# Patient Record
Sex: Female | Born: 1960 | Race: Black or African American | Hispanic: No | Marital: Single | Smoking: Current every day smoker
Health system: Southern US, Community
[De-identification: ages and names within clinical notes are randomized; demographics above are authoritative.]

## PROBLEM LIST (undated history)

## (undated) DIAGNOSIS — I2510 Atherosclerotic heart disease of native coronary artery without angina pectoris: Secondary | ICD-10-CM

## (undated) DIAGNOSIS — I10 Essential (primary) hypertension: Secondary | ICD-10-CM

## (undated) DIAGNOSIS — M1990 Unspecified osteoarthritis, unspecified site: Secondary | ICD-10-CM

## (undated) HISTORY — PX: HAND SURGERY: SHX662

## (undated) HISTORY — PX: KNEE ARTHROSCOPY: SUR90

---

## 2003-11-18 ENCOUNTER — Emergency Department (HOSPITAL_COMMUNITY): Admission: EM | Admit: 2003-11-18 | Discharge: 2003-11-18 | Payer: Self-pay | Admitting: Emergency Medicine

## 2003-12-11 ENCOUNTER — Emergency Department (HOSPITAL_COMMUNITY): Admission: EM | Admit: 2003-12-11 | Discharge: 2003-12-11 | Payer: Self-pay | Admitting: Emergency Medicine

## 2005-10-18 ENCOUNTER — Emergency Department (HOSPITAL_COMMUNITY): Admission: AC | Admit: 2005-10-18 | Discharge: 2005-10-18 | Payer: Self-pay

## 2006-02-01 ENCOUNTER — Emergency Department (HOSPITAL_COMMUNITY): Admission: EM | Admit: 2006-02-01 | Discharge: 2006-02-01 | Payer: Self-pay | Admitting: Emergency Medicine

## 2006-10-20 ENCOUNTER — Emergency Department (HOSPITAL_COMMUNITY): Admission: EM | Admit: 2006-10-20 | Discharge: 2006-10-20 | Payer: Self-pay | Admitting: Emergency Medicine

## 2007-07-26 ENCOUNTER — Emergency Department (HOSPITAL_COMMUNITY): Admission: EM | Admit: 2007-07-26 | Discharge: 2007-07-27 | Payer: Self-pay | Admitting: Emergency Medicine

## 2007-09-12 ENCOUNTER — Emergency Department (HOSPITAL_COMMUNITY): Admission: EM | Admit: 2007-09-12 | Discharge: 2007-09-13 | Payer: Self-pay | Admitting: Emergency Medicine

## 2008-03-15 ENCOUNTER — Emergency Department (HOSPITAL_COMMUNITY): Admission: EM | Admit: 2008-03-15 | Discharge: 2008-03-16 | Payer: Self-pay | Admitting: Emergency Medicine

## 2008-10-24 ENCOUNTER — Emergency Department (HOSPITAL_COMMUNITY): Admission: EM | Admit: 2008-10-24 | Discharge: 2008-10-24 | Payer: Self-pay | Admitting: Emergency Medicine

## 2009-02-27 ENCOUNTER — Emergency Department (HOSPITAL_COMMUNITY): Admission: EM | Admit: 2009-02-27 | Discharge: 2009-02-28 | Payer: Self-pay | Admitting: Emergency Medicine

## 2009-09-24 ENCOUNTER — Ambulatory Visit: Payer: Self-pay | Admitting: Vascular Surgery

## 2009-09-24 ENCOUNTER — Encounter (INDEPENDENT_AMBULATORY_CARE_PROVIDER_SITE_OTHER): Payer: Self-pay | Admitting: Internal Medicine

## 2009-09-24 ENCOUNTER — Observation Stay (HOSPITAL_COMMUNITY): Admission: EM | Admit: 2009-09-24 | Discharge: 2009-09-25 | Payer: Self-pay | Admitting: Emergency Medicine

## 2009-09-24 ENCOUNTER — Ambulatory Visit: Payer: Self-pay | Admitting: Cardiology

## 2010-03-17 LAB — URINALYSIS, ROUTINE W REFLEX MICROSCOPIC
Glucose, UA: NEGATIVE mg/dL
Hgb urine dipstick: NEGATIVE
Ketones, ur: 15 mg/dL — AB
Nitrite: NEGATIVE
Protein, ur: NEGATIVE mg/dL
Specific Gravity, Urine: 1.02 (ref 1.005–1.030)
Urobilinogen, UA: 1 mg/dL (ref 0.0–1.0)
pH: 5 (ref 5.0–8.0)

## 2010-03-17 LAB — RAPID URINE DRUG SCREEN, HOSP PERFORMED
Amphetamines: NOT DETECTED
Barbiturates: NOT DETECTED
Benzodiazepines: NOT DETECTED
Cocaine: NOT DETECTED
Opiates: NOT DETECTED
Tetrahydrocannabinol: NOT DETECTED

## 2010-03-17 LAB — CBC
HCT: 40.2 % (ref 36.0–46.0)
Hemoglobin: 14.2 g/dL (ref 12.0–15.0)
MCH: 35.4 pg — ABNORMAL HIGH (ref 26.0–34.0)
MCHC: 35.3 g/dL (ref 30.0–36.0)
MCV: 100.2 fL — ABNORMAL HIGH (ref 78.0–100.0)
Platelets: 147 10*3/uL — ABNORMAL LOW (ref 150–400)
RBC: 4.01 MIL/uL (ref 3.87–5.11)
RDW: 12.9 % (ref 11.5–15.5)
WBC: 4.1 10*3/uL (ref 4.0–10.5)

## 2010-03-17 LAB — TROPONIN I
Troponin I: 0.01 ng/mL (ref 0.00–0.06)
Troponin I: 0.01 ng/mL (ref 0.00–0.06)

## 2010-03-17 LAB — BASIC METABOLIC PANEL
BUN: 5 mg/dL — ABNORMAL LOW (ref 6–23)
CO2: 23 mEq/L (ref 19–32)
Calcium: 10.3 mg/dL (ref 8.4–10.5)
Chloride: 112 mEq/L (ref 96–112)
Creatinine, Ser: 0.69 mg/dL (ref 0.4–1.2)
GFR calc Af Amer: >60 mL/min (ref >60)
GFR calc non Af Amer: >60 mL/min (ref >60)
Glucose, Bld: 119 mg/dL — ABNORMAL HIGH (ref 70–99)
Potassium: 3.3 mEq/L — ABNORMAL LOW (ref 3.5–5.1)
Sodium: 143 mEq/L (ref 135–145)

## 2010-03-17 LAB — DIFFERENTIAL
Basophils Absolute: 0 10*3/uL (ref 0.0–0.1)
Basophils Relative: 1 % (ref 0–1)
Eosinophils Absolute: 0.1 10*3/uL (ref 0.0–0.7)
Eosinophils Relative: 2 % (ref 0–5)
Lymphocytes Relative: 57 % — ABNORMAL HIGH (ref 12–46)
Lymphs Abs: 2.3 10*3/uL (ref 0.7–4.0)
Monocytes Absolute: 0.6 10*3/uL (ref 0.1–1.0)
Monocytes Relative: 14 % — ABNORMAL HIGH (ref 3–12)
Neutro Abs: 1.1 10*3/uL — ABNORMAL LOW (ref 1.7–7.7)
Neutrophils Relative %: 28 % — ABNORMAL LOW (ref 43–77)

## 2010-03-17 LAB — CK TOTAL AND CKMB (NOT AT ARMC)
CK, MB: 1.6 ng/mL (ref 0.3–4.0)
CK, MB: 3.3 ng/mL (ref 0.3–4.0)
CK, MB: 3.6 ng/mL (ref 0.3–4.0)
Relative Index: 2.4 (ref 0.0–2.5)
Relative Index: 2.6 — ABNORMAL HIGH (ref 0.0–2.5)
Relative Index: INVALID (ref 0.0–2.5)
Relative Index: INVALID (ref 0.0–2.5)
Total CK: 140 U/L (ref 7–177)
Total CK: 141 U/L (ref 7–177)
Total CK: 79 U/L (ref 7–177)

## 2010-03-17 LAB — PROTIME-INR
INR: 0.95 (ref 0.00–1.49)
Prothrombin Time: 12.9 seconds (ref 11.6–15.2)

## 2010-03-17 LAB — HEPATIC FUNCTION PANEL
ALT: 19 U/L (ref 0–35)
AST: 34 U/L (ref 0–37)
Albumin: 3.3 g/dL — ABNORMAL LOW (ref 3.5–5.2)
Alkaline Phosphatase: 70 U/L (ref 39–117)
Total Bilirubin: 0.7 mg/dL (ref 0.3–1.2)

## 2010-03-17 LAB — PHOSPHORUS: Phosphorus: 2.6 mg/dL (ref 2.3–4.6)

## 2010-03-17 LAB — TSH: TSH: 0.486 u[IU]/mL (ref 0.350–4.500)

## 2010-03-17 LAB — URINE MICROSCOPIC-ADD ON

## 2010-03-17 LAB — MAGNESIUM: Magnesium: 2 mg/dL (ref 1.5–2.5)

## 2010-03-17 LAB — ETHANOL: Alcohol, Ethyl (B): 164 mg/dL — ABNORMAL HIGH (ref 0–10)

## 2010-06-25 ENCOUNTER — Emergency Department (HOSPITAL_COMMUNITY)
Admission: EM | Admit: 2010-06-25 | Discharge: 2010-06-25 | Disposition: A | Discharge status: Nursing Home (Includes ICF) | Payer: Self-pay | Attending: Emergency Medicine | Admitting: Emergency Medicine

## 2010-06-25 ENCOUNTER — Emergency Department (HOSPITAL_COMMUNITY): Payer: Self-pay

## 2010-06-25 DIAGNOSIS — M25669 Stiffness of unspecified knee, not elsewhere classified: Secondary | ICD-10-CM | POA: Insufficient documentation

## 2010-06-25 DIAGNOSIS — M25469 Effusion, unspecified knee: Secondary | ICD-10-CM | POA: Insufficient documentation

## 2010-06-25 DIAGNOSIS — Z8673 Personal history of transient ischemic attack (TIA), and cerebral infarction without residual deficits: Secondary | ICD-10-CM | POA: Insufficient documentation

## 2010-06-25 DIAGNOSIS — M25569 Pain in unspecified knee: Secondary | ICD-10-CM | POA: Insufficient documentation

## 2010-09-30 LAB — COMPREHENSIVE METABOLIC PANEL
ALT: 31
Alkaline Phosphatase: 75
BUN: 3 — ABNORMAL LOW
CO2: 24
GFR calc non Af Amer: >60
Glucose, Bld: 102 — ABNORMAL HIGH
Potassium: 3.7
Total Bilirubin: 0.7
Total Protein: 8.3

## 2010-09-30 LAB — DIFFERENTIAL
Basophils Absolute: 0
Basophils Relative: 0
Eosinophils Absolute: 0
Monocytes Relative: 14 — ABNORMAL HIGH
Neutro Abs: 3.3
Neutrophils Relative %: 56

## 2010-09-30 LAB — POCT CARDIAC MARKERS
CKMB, poc: 2.3
Myoglobin, poc: 163
Operator id: 261601

## 2010-09-30 LAB — URINALYSIS, ROUTINE W REFLEX MICROSCOPIC
Bilirubin Urine: NEGATIVE
Glucose, UA: NEGATIVE
Ketones, ur: NEGATIVE
pH: 6

## 2010-09-30 LAB — CBC
HCT: 37.6
Hemoglobin: 12.4
RBC: 3.79 — ABNORMAL LOW
RDW: 19.8 — ABNORMAL HIGH

## 2010-09-30 LAB — RAPID URINE DRUG SCREEN, HOSP PERFORMED
Cocaine: NOT DETECTED
Opiates: NOT DETECTED

## 2010-09-30 LAB — URINE MICROSCOPIC-ADD ON

## 2010-09-30 LAB — ETHANOL: Alcohol, Ethyl (B): 274 — ABNORMAL HIGH

## 2010-10-05 LAB — POCT I-STAT, CHEM 8
BUN: 6
Calcium, Ion: 1.27
Chloride: 107
Creatinine, Ser: 1.2
Glucose, Bld: 98
TCO2: 24

## 2010-12-17 ENCOUNTER — Encounter: Payer: Self-pay | Admitting: Emergency Medicine

## 2010-12-17 ENCOUNTER — Emergency Department (HOSPITAL_COMMUNITY): Payer: Self-pay

## 2010-12-17 ENCOUNTER — Emergency Department (HOSPITAL_COMMUNITY)
Admission: EM | Admit: 2010-12-17 | Discharge: 2010-12-18 | Disposition: A | Discharge status: Home / Self Care | Payer: Self-pay | Attending: Emergency Medicine | Admitting: Emergency Medicine

## 2010-12-17 DIAGNOSIS — M79609 Pain in unspecified limb: Secondary | ICD-10-CM | POA: Insufficient documentation

## 2010-12-17 DIAGNOSIS — Z7982 Long term (current) use of aspirin: Secondary | ICD-10-CM | POA: Insufficient documentation

## 2010-12-17 DIAGNOSIS — Z79899 Other long term (current) drug therapy: Secondary | ICD-10-CM | POA: Insufficient documentation

## 2010-12-17 DIAGNOSIS — M79642 Pain in left hand: Secondary | ICD-10-CM

## 2010-12-17 NOTE — ED Notes (Signed)
PT. REPORTS LEFT HAND PAIN ONSET YESTERDAY MORNING , DENIES INJURY OR FALL , SLIGHT SWELLING.

## 2010-12-18 MED ORDER — IBUPROFEN 800 MG PO TABS
800.0000 mg | ORAL_TABLET | Freq: Once | ORAL | Status: AC
  Administered 2010-12-18: 800 mg via ORAL
  Filled 2010-12-18: qty 1

## 2010-12-18 MED ORDER — IBUPROFEN 800 MG PO TABS: 800.0000 mg | ORAL_TABLET | Freq: Three times a day (TID) | ORAL | Status: AC

## 2010-12-18 MED ORDER — HYDROCODONE-ACETAMINOPHEN 5-500 MG PO CAPS: 1.0000 | ORAL_CAPSULE | Freq: Four times a day (QID) | ORAL | Status: AC | PRN

## 2010-12-18 MED ORDER — HYDROCODONE-ACETAMINOPHEN 5-325 MG PO TABS
2.0000 | ORAL_TABLET | Freq: Once | ORAL | Status: AC
  Administered 2010-12-18: 2 via ORAL
  Filled 2010-12-18: qty 2

## 2010-12-18 NOTE — ED Provider Notes (Signed)
History     CSN: [id number] Arrival date & time: 12/17/2010  9:00 PM   First MD Initiated Contact with Patient 12/18/10 0015      Chief Complaint  Patient presents with  . Hand Pain    (Consider location/radiation/quality/duration/timing/severity/associated sxs/prior treatment) HPI 50 yo female presents to the ER with complaint of 2 days of left hand pain.  Pt denies any injury that she knows of to the hand.  Pt works at [employer] as a Production designer, theatre/television/film, sometimes is required to do a lot of lifting and repetitive motion with her job.  She is RHD.  No prior h/o similar pain.  No h/o gout, arthropathies.  Pain is mainly over the middle and ring MCP joints.  No otc meds prior to arrival, no ice used.  Some pain at the wrist, fingers.  Pt feels hand is swelling.   History reviewed. No pertinent past medical history.  Past Surgical History  Procedure Date  . Knee arthroscopy     No family history on file.  History  Substance Use Topics  . Smoking status: Current Everyday Smoker  . Smokeless tobacco: Not on file  . Alcohol Use: Yes    OB History    Grav Para Term Preterm Abortions TAB SAB Ect Mult Living                  Review of Systems  All other systems reviewed and are negative.    Allergies  Review of patient's allergies indicates no active allergies.  Home Medications   Current Outpatient Rx  Name Route Sig Dispense Refill  . ASPIRIN 81 MG PO TABS Oral Take 81 mg by mouth daily.      . ATORVASTATIN CALCIUM 80 MG PO TABS Oral Take 80 mg by mouth daily.     Marland Kitchen EZETIMIBE 10 MG PO TABS Oral Take 10 mg by mouth daily.      Marland Kitchen HYDROCODONE-ACETAMINOPHEN 5-500 MG PO CAPS Oral Take 1 capsule by mouth every 6 (six) hours as needed for pain. 30 capsule 0  . IBUPROFEN 800 MG PO TABS Oral Take 1 tablet (800 mg total) by mouth 3 (three) times daily. 21 tablet 0  . TRAZODONE HCL 150 MG PO TABS Oral Take 150 mg by mouth at bedtime.        BP 120/71  Pulse 80  Temp(Src) 97.6 F  (36.4 C) (Oral)  Resp 16  SpO2 98%  Physical Exam  Constitutional: She is oriented to person, place, and time. She appears well-developed and well-nourished. No distress.  HENT:  Head: Normocephalic and atraumatic.  Eyes: Conjunctivae and EOM are normal.  Neck: Normal range of motion. No JVD present. No tracheal deviation present. No thyromegaly present.  Pulmonary/Chest: No stridor.  Musculoskeletal:       Left hand examined.  No deformity, no skin discoloration, no warmth, no effusion, no notable swelling.  Pain with palpation over proximal ip/mcp of ring and middle fingers with some milder pain over the other mcps of the same hand.  Pain with flexon and extension of the hand.  Wrist with normal exam/rom.  Cap refill normal, pulses and sensation normal.  Neurological: She is alert and oriented to person, place, and time. She exhibits normal muscle tone. Coordination normal.  Skin: Skin is warm and dry. No rash noted. She is not diaphoretic. No erythema. No pallor.  Psychiatric: She has a normal mood and affect. Her behavior is normal. Judgment and thought content normal.  ED Course  Procedures (including critical care time)  Labs Reviewed - No data to display Dg Hand Complete Left  12/17/2010  *RADIOLOGY REPORT*  Clinical Data: Hand pain.  No injury  LEFT HAND - COMPLETE 3+ VIEW  Comparison: None.  Findings: Negative for fracture.  No significant arthropathy or erosion.  No degenerative changes.  IMPRESSION: Negative  Original Report Authenticated By: Camelia Phenes, M.D.     1. Hand pain, left       MDM  50 yo female with 2 day h/o left hand pain, negative xrays, and unremarkable exam.  Unsure of etiology of pain.  Will treat with splint, rest, ice, elevation, nsaids and f/u with hand ortho as needed        Olivia Mackie, MD 12/18/10 [phone]

## 2010-12-18 NOTE — ED Notes (Signed)
Pt shows no signs of neuro deficits 

## 2011-01-25 ENCOUNTER — Encounter (HOSPITAL_COMMUNITY): Payer: Self-pay | Admitting: Emergency Medicine

## 2011-01-25 ENCOUNTER — Emergency Department (INDEPENDENT_AMBULATORY_CARE_PROVIDER_SITE_OTHER)
Admission: EM | Admit: 2011-01-25 | Discharge: 2011-01-25 | Disposition: A | Discharge status: Home / Self Care | Payer: Self-pay | Source: Home / Self Care | Attending: Emergency Medicine | Admitting: Emergency Medicine

## 2011-01-25 DIAGNOSIS — J40 Bronchitis, not specified as acute or chronic: Secondary | ICD-10-CM

## 2011-01-25 DIAGNOSIS — I10 Essential (primary) hypertension: Secondary | ICD-10-CM

## 2011-01-25 HISTORY — DX: Essential (primary) hypertension: I10

## 2011-01-25 HISTORY — DX: Atherosclerotic heart disease of native coronary artery without angina pectoris: I25.10

## 2011-01-25 MED ORDER — DEXAMETHASONE 4 MG PO TABS: ORAL_TABLET | ORAL | Status: AC

## 2011-01-25 MED ORDER — ALBUTEROL SULFATE HFA 108 (90 BASE) MCG/ACT IN AERS: 1.0000 | INHALATION_SPRAY | Freq: Four times a day (QID) | RESPIRATORY_TRACT | Status: DC | PRN

## 2011-01-25 MED ORDER — GUAIFENESIN-CODEINE 100-10 MG/5ML PO SYRP: 5.0000 mL | ORAL_SOLUTION | Freq: Four times a day (QID) | ORAL | Status: AC | PRN

## 2011-01-25 MED ORDER — GUAIFENESIN ER 600 MG PO TB12: 600.0000 mg | ORAL_TABLET | Freq: Two times a day (BID) | ORAL | Status: DC

## 2011-01-25 NOTE — ED Notes (Signed)
Pt having cough since 01-02-11 that is getting progressively worse. There are greenish yellow thick secretions.  States cough worse at night and she has tried all the OTC mediations with no relief.

## 2011-01-25 NOTE — ED Provider Notes (Signed)
History     CSN: [id number]  Arrival date & time 01/25/11  1326   First MD Initiated Contact with Patient 01/25/11 1410      Chief Complaint  Patient presents with  . Cough    (Consider location/radiation/quality/duration/timing/severity/associated sxs/prior treatment) HPI Comments: Patient with cough over the past month. Cough started after having an upper respiratory infection. States is coughing up yellowish thick mucus. Reports some chest tightness, and is unable to sleep at night secondary to all the coughing. States that her chest and upper abdomen are "sore" from all the coughing. No fevers, chest pain, wheezing, shortness of breath. No nausea, vomiting, posttussive emesis, hemoptysis, or change in characteristic of her sputum,  unintentional weight loss. Tried Mucinex, Robitussin without relief. Patient is a long-term smoker.   ROS as noted in HPI. All other ROS negative.  Patient is a 51 y.o. female presenting with cough. The history is provided by the patient. No language interpreter was used.  Cough This is a new problem. The current episode started more than 1 week ago. The problem has been gradually worsening. There has been no fever. She has tried decongestants for the symptoms. She is a smoker. Her past medical history does not include bronchitis, pneumonia, COPD, emphysema or asthma.    Past Medical History  Diagnosis Date  . Hypertension   . CAD (coronary artery disease)     Past Surgical History  Procedure Date  . Knee arthroscopy     History reviewed. No pertinent family history.  History  Substance Use Topics  . Smoking status: Current Everyday Smoker -- 0.5 packs/day  . Smokeless tobacco: Not on file  . Alcohol Use: Yes    OB History    Grav Para Term Preterm Abortions TAB SAB Ect Mult Living                  Review of Systems  Respiratory: Positive for cough.     Allergies  Review of patient's allergies indicates no known  allergies.  Home Medications   Current Outpatient Rx  Name Route Sig Dispense Refill  . ALBUTEROL SULFATE HFA 108 (90 BASE) MCG/ACT IN AERS Inhalation Inhale 1-2 puffs into the lungs every 6 (six) hours as needed for wheezing. 1 Inhaler 0  . ASPIRIN 81 MG PO TABS Oral Take 81 mg by mouth daily.      Marland Kitchen DEXAMETHASONE 4 MG PO TABS  4 tabs po at once on day one, 4 tabs po at once on day 2 8 tablet 0  . GUAIFENESIN ER 600 MG PO TB12 Oral Take 1 tablet (600 mg total) by mouth 2 (two) times daily. 14 tablet 0  . GUAIFENESIN-CODEINE 100-10 MG/5ML PO SYRP Oral Take 5 mLs by mouth 4 (four) times daily as needed for cough. 120 mL 0    BP 166/104  Pulse 84  Temp(Src) 98.7 F (37.1 C) (Oral)  Resp 20  SpO2 99%  Physical Exam  Nursing note and vitals reviewed. Constitutional: She is oriented to person, place, and time. She appears well-developed and well-nourished.  HENT:  Head: Normocephalic and atraumatic.  Eyes: Conjunctivae and EOM are normal.  Neck: Normal range of motion.  Cardiovascular: Normal rate, regular rhythm, normal heart sounds and intact distal pulses.   No murmur heard. Pulmonary/Chest: Effort normal and breath sounds normal. No respiratory distress. She has no wheezes. She has no rales. She exhibits tenderness.       Coughing. Diffuse chest wall tenderness  Abdominal: Soft.  Bowel sounds are normal. She exhibits no distension. There is no tenderness. There is no rebound and no guarding.  Musculoskeletal: Normal range of motion.  Neurological: She is alert and oriented to person, place, and time.  Skin: Skin is warm and dry.  Psychiatric: She has a normal mood and affect. Her behavior is normal. Judgment and thought content normal.    ED Course  Procedures (including critical care time)  Labs Reviewed - No data to display No results found.   1. Bronchitis   2. Hypertension     MDM  Blood pressure noted. Patient asymptomatic today. Patient has not been on  any of  her chronic medications, including blood pressure medications "for some time". Does not remember what her blood pressure medicine was. Patient also smoker. No wheezing, rales, rhonchi. Satting well on room air. No history of fevers. H&P more consistent with bronchitis rather than pneumonia. Deferring chest x-ray. Sending home with bronchodilators, expectorants, cough syrup at night, and short course of steroids. Discussed importance of primary care physician and taking blood pressure medication on regular basis. Will refer to local resources.  Luiz Blare, MD 01/25/11 [phone]

## 2011-06-15 ENCOUNTER — Emergency Department (HOSPITAL_COMMUNITY)
Admission: EM | Admit: 2011-06-15 | Discharge: 2011-06-15 | Disposition: A | Discharge status: Home / Self Care | Payer: Self-pay | Attending: Emergency Medicine | Admitting: Emergency Medicine

## 2011-06-15 ENCOUNTER — Emergency Department (HOSPITAL_COMMUNITY): Payer: Self-pay

## 2011-06-15 ENCOUNTER — Encounter (HOSPITAL_COMMUNITY): Payer: Self-pay | Admitting: *Deleted

## 2011-06-15 DIAGNOSIS — I2510 Atherosclerotic heart disease of native coronary artery without angina pectoris: Secondary | ICD-10-CM | POA: Insufficient documentation

## 2011-06-15 DIAGNOSIS — F1010 Alcohol abuse, uncomplicated: Secondary | ICD-10-CM | POA: Insufficient documentation

## 2011-06-15 DIAGNOSIS — R0602 Shortness of breath: Secondary | ICD-10-CM | POA: Insufficient documentation

## 2011-06-15 DIAGNOSIS — R55 Syncope and collapse: Secondary | ICD-10-CM | POA: Insufficient documentation

## 2011-06-15 DIAGNOSIS — R079 Chest pain, unspecified: Secondary | ICD-10-CM | POA: Insufficient documentation

## 2011-06-15 DIAGNOSIS — I10 Essential (primary) hypertension: Secondary | ICD-10-CM | POA: Insufficient documentation

## 2011-06-15 LAB — URINALYSIS, ROUTINE W REFLEX MICROSCOPIC
Glucose, UA: NEGATIVE mg/dL
Protein, ur: NEGATIVE mg/dL
Specific Gravity, Urine: 1.006 (ref 1.005–1.030)
pH: 7.5 (ref 5.0–8.0)

## 2011-06-15 LAB — DIFFERENTIAL
Eosinophils Absolute: 0 10*3/uL (ref 0.0–0.7)
Lymphocytes Relative: 64 % — ABNORMAL HIGH (ref 12–46)
Lymphs Abs: 3.1 10*3/uL (ref 0.7–4.0)
Neutro Abs: 1.1 10*3/uL — ABNORMAL LOW (ref 1.7–7.7)
Neutrophils Relative %: 23 % — ABNORMAL LOW (ref 43–77)

## 2011-06-15 LAB — COMPREHENSIVE METABOLIC PANEL
BUN: 7 mg/dL (ref 6–23)
CO2: 24 mEq/L (ref 19–32)
Calcium: 9.9 mg/dL (ref 8.4–10.5)
Chloride: 106 mEq/L (ref 96–112)
Creatinine, Ser: 0.54 mg/dL (ref 0.50–1.10)
GFR calc Af Amer: >90 mL/min (ref >90)
GFR calc non Af Amer: >90 mL/min (ref >90)
Total Bilirubin: 0.3 mg/dL (ref 0.3–1.2)

## 2011-06-15 LAB — ETHANOL: Alcohol, Ethyl (B): 226 mg/dL — ABNORMAL HIGH (ref 0–11)

## 2011-06-15 LAB — TROPONIN I: Troponin I: <0.3 ng/mL (ref <0.30)

## 2011-06-15 LAB — URINE MICROSCOPIC-ADD ON

## 2011-06-15 LAB — CBC
Platelets: 171 10*3/uL (ref 150–400)
RBC: 4.13 MIL/uL (ref 3.87–5.11)
WBC: 4.8 10*3/uL (ref 4.0–10.5)

## 2011-06-15 LAB — RAPID URINE DRUG SCREEN, HOSP PERFORMED
Benzodiazepines: NOT DETECTED
Opiates: NOT DETECTED

## 2011-06-15 MED ORDER — FOLIC ACID 1 MG PO TABS: 1.0000 mg | ORAL_TABLET | Freq: Every day | ORAL | Status: DC

## 2011-06-15 MED ORDER — SODIUM CHLORIDE 0.9 % IV SOLN
1000.0000 mL | Freq: Once | INTRAVENOUS | Status: AC
  Administered 2011-06-15: 1000 mL via INTRAVENOUS

## 2011-06-15 MED ORDER — THIAMINE HCL 100 MG PO TABS: 100.0000 mg | ORAL_TABLET | Freq: Every day | ORAL | Status: DC

## 2011-06-15 MED ORDER — SODIUM CHLORIDE 0.9 % IV SOLN: 1000.0000 mL | INTRAVENOUS | Status: DC

## 2011-06-15 NOTE — ED Notes (Signed)
Pt significant other called out to nurses station saying the patient was not responsive to her. Went in room, pt sitting up, looking around. Pt sig other talking to her asking her questions, pt not responding verbally. After about 2 minutes, pt responding appropriately. See neuro assessment. Sig other reports this is the way the patient was at home when she had to call EMS.  Notified Fayrene Helper of the same. Pt transported to xray.

## 2011-06-15 NOTE — Discharge Instructions (Signed)
Please followup with [facility] cardiology for further evaluation of your chest pain. Take supplementation as described. Please avoid alcohol abuse. I include a list of resources that you can call for help if needed. Return to the ED if you have any other concerns  Alcohol Problems Most adults who drink alcohol drink in moderation (not a lot) are at low risk for developing problems related to their drinking. However, all drinkers, including low-risk drinkers, should know about the health risks connected with drinking alcohol. RECOMMENDATIONS FOR LOW-RISK DRINKING  Drink in moderation. Moderate drinking is defined as follows:   Men - no more than 2 drinks per day.   Nonpregnant women - no more than 1 drink per day.   Over age 38 - no more than 1 drink per day.  A standard drink is 12 grams of pure alcohol, which is equal to a 12 ounce bottle of beer or wine cooler, a 5 ounce glass of wine, or 1.5 ounces of distilled spirits (such as whiskey, brandy, vodka, or rum).  ABSTAIN FROM (DO NOT DRINK) ALCOHOL:  When pregnant or considering pregnancy.   When taking a medication that interacts with alcohol.   If you are alcohol dependent.   A medical condition that prohibits drinking alcohol (such as ulcer, liver disease, or heart disease).  DISCUSS WITH YOUR CAREGIVER:  If you are at risk for coronary heart disease, discuss the potential benefits and risks of alcohol use: Light to moderate drinking is associated with lower rates of coronary heart disease in certain populations (for example, men over age 23 and postmenopausal women). Infrequent or nondrinkers are advised not to begin light to moderate drinking to reduce the risk of coronary heart disease so as to avoid creating an alcohol-related problem. Similar protective effects can likely be gained through proper diet and exercise.   Women and the elderly have smaller amounts of body water than men. As a result women and the elderly achieve a higher  blood alcohol concentration after drinking the same amount of alcohol.   Exposing a fetus to alcohol can cause a broad range of birth defects referred to as Fetal Alcohol Syndrome (FAS) or Alcohol-Related Birth Defects (ARBD). Although FAS/ARBD is connected with excessive alcohol consumption during pregnancy, studies also have reported neurobehavioral problems in infants born to mothers reporting drinking an average of 1 drink per day during pregnancy.   Heavier drinking (the consumption of more than 4 drinks per occasion by men and more than 3 drinks per occasion by women) impairs learning (cognitive) and psychomotor functions and increases the risk of alcohol-related problems, including accidents and injuries.  CAGE QUESTIONS:   Have you ever felt that you should Cut down on your drinking?   Have people Annoyed you by criticizing your drinking?   Have you ever felt bad or Guilty about your drinking?   Have you ever had a drink first thing in the morning to steady your nerves or get rid of a hangover (Eye opener)?  If you answered positively to any of these questions: You may be at risk for alcohol-related problems if alcohol consumption is:   Men: Greater than 14 drinks per week or more than 4 drinks per occasion.   Women: Greater than 7 drinks per week or more than 3 drinks per occasion.  Do you or your family have a medical history of alcohol-related problems, such as:  Blackouts.   Sexual dysfunction.   Depression.   Trauma.   Liver dysfunction.   Sleep  disorders.   Hypertension.   Chronic abdominal pain.   Has your drinking ever caused you problems, such as problems with your family, problems with your work (or school) performance, or accidents/injuries?   Do you have a compulsion to drink or a preoccupation with drinking?   Do you have poor control or are you unable to stop drinking once you have started?   Do you have to drink to avoid withdrawal symptoms?   Do  you have problems with withdrawal such as tremors, nausea, sweats, or mood disturbances?   Does it take more alcohol than in the past to get you high?   Do you feel a strong urge to drink?   Do you change your plans so that you can have a drink?   Do you ever drink in the morning to relieve the shakes or a hangover?  If you have answered a number of the previous questions positively, it may be time for you to talk to your caregivers, family, and friends and see if they think you have a problem. Alcoholism is a chemical dependency that keeps getting worse and will eventually destroy your health and relationships. Many alcoholics end up dead, impoverished, or in prison. This is often the end result of all chemical dependency.  Do not be discouraged if you are not ready to take action immediately.   Decisions to change behavior often involve up and down desires to change and feeling like you cannot decide.   Try to think more seriously about your drinking behavior.   Think of the reasons to quit.  WHERE TO GO FOR ADDITIONAL INFORMATION   The National Institute on Alcohol Abuse and Alcoholism (NIAAA)www.niaaa.nih.gov   [employer] on Alcoholism and Drug Dependence (NCADD)www.ncadd.org   American Society of Addiction Medicine (ASAM)www.https://anderson-johnson.com/  Document Released: 12/19/2004 Document Revised: 12/08/2010 Document Reviewed: 08/07/2007 [facility] Patient Information 2012 [place], [place].  Syncope You have had a fainting (syncopal) spell. A fainting episode is a sudden, short-lived loss of consciousness. It results in complete recovery. It occurs because there has been a temporary shortage of oxygen and/or sugar (glucose) to the brain. CAUSES   Blood pressure pills and other medications that may lower blood pressure below normal. Sudden changes in posture (sudden standing).   Over-medication. Take your medications as directed.   Standing too long. This can cause blood to pool in the  legs.   Seizure disorders.   Low blood sugar (hypoglycemia) of diabetes. This more commonly causes coma.   Bearing down to go to the bathroom. This can cause your blood pressure to rise suddenly. Your body compensates by making the blood pressure too low when you stop bearing down.   Hardening of the arteries where the brain temporarily does not receive enough blood.   Irregular heart beat and circulatory problems.   Fear, emotional distress, injury, sight of blood, or illness.  Your caregiver will send you home if the syncope was from non-worrisome causes (benign). Depending on your age and health, you may stay to be monitored and observed. If you return home, have someone stay with you if your caregiver feels that is desirable. It is very important to keep all follow-up referrals and appointments in order to properly manage this condition. This is a serious problem which can lead to serious illness and death if not carefully managed.  WARNING: Do not drive or operate machinery until your caregiver feels that it is safe for you to do so. SEEK IMMEDIATE MEDICAL CARE IF:  You have another fainting episode or faint while lying or sitting down. DO NOT DRIVE YOURSELF. Call 911 if no other help is available.   You have chest pain, are feeling sick to your stomach (nausea), vomiting or abdominal pain.   You have an irregular heartbeat or one that is very fast (pulse over 120 beats per minute).   You have a loss of feeling in some part of your body or lose movement in your arms or legs.   You have difficulty with speech, confusion, severe weakness, or visual problems.   You become sweaty and/or feel light headed.  Make sure you are rechecked as instructed. Document Released: 12/19/2004 Document Revised: 12/08/2010 Document Reviewed: 08/09/2006 [facility] Patient Information 2012 [place], [place].  RESOURCE GUIDE  Chronic Pain Problems: Contact [facility]   [phone] Patients need to be referred by their primary care doctor.  Insufficient Money for Medicine: Contact United Way:  call "211" or [facility] 715-082-3694.  No Primary Care Doctor: - Call Health Connect  [phone] - can help you locate a primary care doctor that  accepts your insurance, provides certain services, etc. - Physician Referral Service- [phone]  Agencies that provide inexpensive medical care: - [facility]  [phone] - [facility]  [phone] - [facility]  [phone] - Women's Clinic  [phone] - Planned Parenthood  [phone] [facility]  [phone]  Medicaid-accepting [facility] Providers: - [facility]- [address]  [phone], Mon-Fri 9am-7pm, Sat 9am-1pm - [facility]- [address] [place], [address] [place]  [phone] - [facility]- [address], [address] [place]  [phone] [facility]- [address]  [phone] - Renaye Rakers- [address] [place], [address], [phone]  Only accepts [place] patients after they have their name  applied to their card  Self Pay (no insurance) in [place]: - Sickle Cell Patients: Dr Willey Blade, [facility]  [address] [place], [phone] - [facility]- [address] [place]  [phone]       [facility] [place]- [address]       -     [facility]- see information above (Speak to [employer] if you do not have insurance)       -  [facility]- [address] [place], [phone]       -  [facility]- [address],  [phone]       -  [facility]- [address] [phone]       -  Dr Julio Sicks-  [address], [place], [phone]       -  [facility]- [address], [phone]       -  [facility]- [address] [phone],  also [address], [phone]       -    [facility]- [address] [place], [phone], 1st & 3rd Saturday   every month, 10am-1pm  1) Find a Doctor and Pay Out of Pocket Although you won't have to find out who is covered by your insurance plan, it is a good idea to ask around and get recommendations. You will then need to call the office and see if  the doctor you have chosen will accept you as a new patient and what types of options they offer for patients who are self-pay. Some doctors offer discounts or will set up payment plans for their patients who do not have insurance, but you will need to ask so you aren't surprised when you get to your appointment.  2) Contact Your Local Health Department Not all health departments have doctors that can see patients for sick visits, but many do, so it is worth a call to see if yours does. If you don't know where your local health department is, you can check in your phone book. The CDC also has a tool to help you locate your state's health department, and many state websites also have listings of all of their local health departments.  3) Find a Walk-in Clinic If your illness is not likely to be very severe or complicated, you may want to try a walk in clinic. These are popping up all over the country in pharmacies, drugstores, and shopping centers. They're usually staffed by nurse practitioners or physician assistants that have been trained to treat common illnesses and complaints. They're usually fairly quick and inexpensive. However, if you have serious medical issues or chronic medical problems, these are probably not your best option  STD Testing - [facility] of [facility] [place], STD Clinic, [address], [place], phone [phone] or [phone].  Monday - Friday, call for an appointment. [facility] of [employer], STD Clinic, [address], [place], phone [phone]  or [phone].  Monday - Friday, call for an appointment.  Abuse/Neglect: [facility] Child Abuse Hotline (506)179-3182 [facility] [place] Child Abuse Hotline 4144770843 (After Hours)  Emergency Shelter:  [facility] (928)486-9395  Maternity Homes: - [facility] [phone] - [facility] [phone]  MRSA Hotline #:   989-044-6809  [facility] Resources  [facility]  United [employer] [facility] Dept. [address]                 [address]         [address] [place] [address]  Blondell Reveal Phone:  [phone]                                  Phone:  [phone]                   Phone:  [phone]  [facility], [phone] - [phone]       -     [facility] in [place], [address],                                  [phone], [place]  Child Abuse Hotline 936-610-7815 or [phone] (After Hours)   Behavioral Health Services  Substance Abuse Resources: - Alcohol and Drug Services  249-433-8846 - [facility] [phone] - [facility] [place] [phone] Floydene Flock [phone] - Residential & Outpatient Substance Abuse Program  [phone]  Psychological Services: [facility]  [phone] [facility] Services  [phone] - [facility], [phone] [place]. [address] [place], ACCESS LINE: [phone] or [phone], [employer].[email or web address]  Dental Assistance  If unable to pay or uninsured, contact:  [facility] or [facility]. to become qualified for the adult dental clinic.  Patients with Medicaid: [facility] [phone] [address],  [phone] [address] [phone]  If unable to pay, or uninsured, contact [facility] [phone]) or [facility] [phone] in [place], [phone] in [facility]) to become qualified for the adult dental clinic  Other Low-Cost Community Dental Services: - [facility]- [address] [place], [place], [place], [address], [phone], Ext. [phone], 2nd and 4th Thursday of the month at 6:30am.  10 clients each day by appointment, can sometimes see walk-in patients if someone does not show for an appointment. [facility]- [address] Ether Griffins [place], [place], [address], [phone] - [facility]- [address] [place], [place], [address], [phone] - [place] [facility]- [phone] [facility]- [phone] [facility] [phone] -

## 2011-06-15 NOTE — ED Provider Notes (Signed)
History     CSN: [id number]  Arrival date & time 06/15/11  1815   First MD Initiated Contact with Patient 06/15/11 1827      Chief Complaint  Patient presents with  . Loss of Consciousness    (Consider location/radiation/quality/duration/timing/severity/associated sxs/prior treatment) HPI  51 year old female with history of hypertension presents for evaluation of the syncope. Patient states for the past month she has had intermittent chest pain. Describe pain as a sharp sensation to the left chest that radiates to both arms. Pain is usually associated with some mild shortness of breath. Patient states stress test to worsen his symptoms. Pain is usually lasting for minutes and does radiates down to both arms with some tenderness sensation to her hands. Stress from work  worsen it and nothing seems to make it better.  Patient does not related to exertion.  Today patient was having the same chest pain as she has been expressing for the past month. She went to use the bathroom and went back to bed. Her friends who were at her house recommend pt to come to the ED for evaluation of her chest pain.  Her friend report pt was upset about going to the hospital and left the bed to walk toward her refrigerator when she apparently had a syncopal episode and fell onto the carpet.  It was a witnessed event.  Pt were on the ground for minutes and then awoke. Pt does endorse some mild confusion and having headache.  No tongue biting or incontinence.  Pt then brought to ED for further evaluation.  Pt report drinking 6 pack/day and did drink "6 packs" prior to her syncopal episode.  Sts she hasn't been eating enough lately due to lack of appetite.  She denies neck pain, vision changes, n/v/d, abd pain, urinary sxs, or numbness.  She is a 1/2 pack/day smoker.  Denies rec drug use.  Denies hx of DM.  Pt still endorse cp. And mild headache to her forehead.    Past Medical History  Diagnosis Date  . Hypertension     . CAD (coronary artery disease)     Past Surgical History  Procedure Date  . Knee arthroscopy     History reviewed. No pertinent family history.  History  Substance Use Topics  . Smoking status: Current Everyday Smoker -- 0.5 packs/day  . Smokeless tobacco: Not on file  . Alcohol Use: Yes    OB History    Grav Para Term Preterm Abortions TAB SAB Ect Mult Living                  Review of Systems  All other systems reviewed and are negative.    Allergies  Review of patient's allergies indicates no known allergies.  Home Medications   Current Outpatient Rx  Name Route Sig Dispense Refill  . ALBUTEROL SULFATE HFA 108 (90 BASE) MCG/ACT IN AERS Inhalation Inhale 1-2 puffs into the lungs every 6 (six) hours as needed for wheezing. 1 Inhaler 0  . ASPIRIN 81 MG PO TABS Oral Take 81 mg by mouth daily.      . GUAIFENESIN ER 600 MG PO TB12 Oral Take 1 tablet (600 mg total) by mouth 2 (two) times daily. 14 tablet 0    BP 165/111  Pulse 84  Temp 98.2 F (36.8 C) (Oral)  Resp 18  Ht 5\' 7"  (1.702 m)  Wt 180 lb (81.647 kg)  BMI 28.19 kg/m2  SpO2 99%  Physical Exam  Nursing  note and vitals reviewed. Constitutional: She is oriented to person, place, and time. She appears well-developed and well-nourished. No distress.       Awake, alert, nontoxic appearance  HENT:  Head: Normocephalic and atraumatic.  Eyes: Conjunctivae are normal. Right eye exhibits no discharge. Left eye exhibits no discharge.  Neck: Normal range of motion. Neck supple.  Cardiovascular: Normal rate and regular rhythm.   Pulmonary/Chest: Effort normal. No respiratory distress. She has no wheezes. She exhibits no tenderness.  Abdominal: Soft. There is no tenderness. There is no rebound.  Musculoskeletal: She exhibits no edema and no tenderness.       ROM appears intact, no obvious focal weakness  Lymphadenopathy:    She has no cervical adenopathy.  Neurological: She is alert and oriented to person,  place, and time. She has normal strength. She displays no tremor. No cranial nerve deficit or sensory deficit. She displays a negative Romberg sign. Coordination and gait normal. GCS eye subscore is 4. GCS verbal subscore is 5. GCS motor subscore is 6.       Mental status and motor strength appears intact  Skin: No rash noted.  Psychiatric: She has a normal mood and affect.    ED Course  Procedures (including critical care time)  Labs Reviewed - No data to display No results found.   No diagnosis found.   Date: 06/15/2011  Rate: 72  Rhythm: normal sinus rhythm  QRS Axis: normal  Intervals: normal  ST/T Wave abnormalities: nonspecific ST changes  Conduction Disutrbances: Atrial premature complex  Narrative Interpretation:   Old EKG Reviewed: unchanged  Results for orders placed during the hospital encounter of 06/15/11  COMPREHENSIVE METABOLIC PANEL      Component Value Range   Sodium 142  135 - 145 mEq/L   Potassium 3.7  3.5 - 5.1 mEq/L   Chloride 106  96 - 112 mEq/L   CO2 24  19 - 32 mEq/L   Glucose, Bld 106 (*) 70 - 99 mg/dL   BUN 7  6 - 23 mg/dL   Creatinine, Ser 1.61  0.50 - 1.10 mg/dL   Calcium 9.9  8.4 - 09.6 mg/dL   Total Protein 8.2  6.0 - 8.3 g/dL   Albumin 3.8  3.5 - 5.2 g/dL   AST 66 (*) 0 - 37 U/L   ALT 31  0 - 35 U/L   Alkaline Phosphatase 99  39 - 117 U/L   Total Bilirubin 0.3  0.3 - 1.2 mg/dL   GFR calc non Af Amer >90  >90 mL/min   GFR calc Af Amer >90  >90 mL/min  CBC      Component Value Range   WBC 4.8  4.0 - 10.5 K/uL   RBC 4.13  3.87 - 5.11 MIL/uL   Hemoglobin 14.1  12.0 - 15.0 g/dL   HCT 04.5  40.9 - 81.1 %   MCV 97.8  78.0 - 100.0 fL   MCH 34.1 (*) 26.0 - 34.0 pg   MCHC 34.9  30.0 - 36.0 g/dL   RDW 91.4  78.2 - 95.6 %   Platelets 171  150 - 400 K/uL  DIFFERENTIAL      Component Value Range   Neutrophils Relative 23 (*) 43 - 77 %   Neutro Abs 1.1 (*) 1.7 - 7.7 K/uL   Lymphocytes Relative 64 (*) 12 - 46 %   Lymphs Abs 3.1  0.7 - 4.0  K/uL   Monocytes Relative 11  3 - 12 %  Monocytes Absolute 0.6  0.1 - 1.0 K/uL   Eosinophils Relative 1  0 - 5 %   Eosinophils Absolute 0.0  0.0 - 0.7 K/uL   Basophils Relative 1  0 - 1 %   Basophils Absolute 0.0  0.0 - 0.1 K/uL  URINALYSIS, ROUTINE W REFLEX MICROSCOPIC      Component Value Range   Color, Urine YELLOW  YELLOW   APPearance CLEAR  CLEAR   Specific Gravity, Urine 1.006  1.005 - 1.030   pH 7.5  5.0 - 8.0   Glucose, UA NEGATIVE  NEGATIVE mg/dL   Hgb urine dipstick NEGATIVE  NEGATIVE   Bilirubin Urine NEGATIVE  NEGATIVE   Ketones, ur NEGATIVE  NEGATIVE mg/dL   Protein, ur NEGATIVE  NEGATIVE mg/dL   Urobilinogen, UA 0.2  0.0 - 1.0 mg/dL   Nitrite NEGATIVE  NEGATIVE   Leukocytes, UA SMALL (*) NEGATIVE  ETHANOL      Component Value Range   Alcohol, Ethyl (B) 226 (*) 0 - 11 mg/dL  URINE RAPID DRUG SCREEN (HOSP PERFORMED)      Component Value Range   Opiates NONE DETECTED  NONE DETECTED   Cocaine NONE DETECTED  NONE DETECTED   Benzodiazepines NONE DETECTED  NONE DETECTED   Amphetamines NONE DETECTED  NONE DETECTED   Tetrahydrocannabinol NONE DETECTED  NONE DETECTED   Barbiturates NONE DETECTED  NONE DETECTED  TROPONIN I      Component Value Range   Troponin I <0.30  <0.30 ng/mL  GLUCOSE, CAPILLARY      Component Value Range   Glucose-Capillary 109 (*) 70 - 99 mg/dL  URINE MICROSCOPIC-ADD ON      Component Value Range   Squamous Epithelial / LPF RARE  RARE   WBC, UA 0-2  <3 WBC/hpf   RBC / HPF 0-2  <3 RBC/hpf   Dg Chest 2 View  06/15/2011  *RADIOLOGY REPORT*  Clinical Data: Chest pain, shortness of breath  CHEST - 2 VIEW  Comparison: None.  Findings: Normal heart size and vascularity.  Negative for pneumonia, collapse, consolidation, edema, effusion or pneumothorax.  Trachea midline., Tortuous atherosclerotic aortic contour.  IMPRESSION: No acute chest process  Original Report Authenticated By: Judie Petit. Ruel Favors, M.D.      MDM  The patient is having recurrent  chest pain for the past month. Had a syncopal episode today. Low suspicion for seizure. EKG shows no acute changes. Chest pain and syncopal workup initiated.   No documented hx of CAD.  Pt has a 2D echo, serial enzymes and telemetry monitor in her admission in 2011 but was rule out for cardiac cause.    Vital signs and medical records were reviewed and considered.  Labs and imaging were reviewed by me.  Discussed care with my attending.      8:25 PM Patient alcohol level is 266. Her labs otherwise unremarkable. No significant changes on her EKG. Her troponin is negative. Normal orthostatic VS.    9:55 PM Syncope likely secondary to recent alcohol intoxication.  Alcohol cessation discussed.  Resources and referral given.  CP is atypical, pt agrees to f/u with cardio for further care.  Will d/c with folic acid and thiamin supplement.  My attending has seen and evaluate pt and agrees with plan.  Pt voice understanding.    Fayrene Helper, PA-C 06/15/11 2156

## 2011-06-15 NOTE — ED Notes (Addendum)
Patient got up at home and had a syncopal episode witnessed by significant other.  Significant other states" she had been working double shift 6 days a week and not eating very well" Also, states "she drinks alcohol everyday 6-7 beers". patient states "chest pain with tingling in bilateral arms on arrival to ED".

## 2011-06-15 NOTE — ED Notes (Signed)
Patient was assisted to bathroom to void.

## 2011-06-15 NOTE — ED Provider Notes (Signed)
Medical screening examination/treatment/procedure(s) were conducted as a shared visit with non-physician practitioner(s) and myself.  I personally evaluated the patient during the encounter  Patient presents with several complaints including an episode of loss of consciousness. I suspect this could be related to her significant alcohol intoxication. We counseled the patient on decreasing alcohol consumption and also on following up with primary care Dr. for further evaluation .  Patient has family here to take her home   Celene Kras, MD 06/15/11 2201

## 2011-06-15 NOTE — ED Notes (Signed)
CBG 109 

## 2011-06-15 NOTE — ED Notes (Signed)
MD/PA at bedside to discuss plan of care

## 2011-08-26 ENCOUNTER — Emergency Department (HOSPITAL_COMMUNITY): Payer: Self-pay

## 2011-08-26 ENCOUNTER — Encounter (HOSPITAL_COMMUNITY): Payer: Self-pay | Admitting: Physical Medicine and Rehabilitation

## 2011-08-26 ENCOUNTER — Emergency Department (HOSPITAL_COMMUNITY)
Admission: EM | Admit: 2011-08-26 | Discharge: 2011-08-26 | Disposition: A | Discharge status: Home / Self Care | Payer: Self-pay | Attending: Emergency Medicine | Admitting: Emergency Medicine

## 2011-08-26 DIAGNOSIS — M79642 Pain in left hand: Secondary | ICD-10-CM

## 2011-08-26 DIAGNOSIS — R209 Unspecified disturbances of skin sensation: Secondary | ICD-10-CM | POA: Insufficient documentation

## 2011-08-26 DIAGNOSIS — I10 Essential (primary) hypertension: Secondary | ICD-10-CM | POA: Insufficient documentation

## 2011-08-26 DIAGNOSIS — M79609 Pain in unspecified limb: Secondary | ICD-10-CM | POA: Insufficient documentation

## 2011-08-26 DIAGNOSIS — F17200 Nicotine dependence, unspecified, uncomplicated: Secondary | ICD-10-CM | POA: Insufficient documentation

## 2011-08-26 DIAGNOSIS — I2510 Atherosclerotic heart disease of native coronary artery without angina pectoris: Secondary | ICD-10-CM | POA: Insufficient documentation

## 2011-08-26 MED ORDER — IBUPROFEN 400 MG PO TABS
800.0000 mg | ORAL_TABLET | Freq: Once | ORAL | Status: AC
  Administered 2011-08-26: 800 mg via ORAL
  Filled 2011-08-26: qty 2

## 2011-08-26 MED ORDER — OXYCODONE-ACETAMINOPHEN 5-325 MG PO TABS
2.0000 | ORAL_TABLET | Freq: Once | ORAL | Status: AC
  Administered 2011-08-26: 2 via ORAL
  Filled 2011-08-26: qty 2

## 2011-08-26 MED ORDER — OXYCODONE-ACETAMINOPHEN 5-325 MG PO TABS: 2.0000 | ORAL_TABLET | ORAL | Status: AC | PRN

## 2011-08-26 NOTE — ED Notes (Signed)
Pt presents to department for evaluation of bilateral numbness/tingling. Ongoing x5 months. Pt states her hands "lock up." 9/10 pain at the time. Strong equal bilateral grip strengths. Pt states "sometimes my hands feel like they are asleep." she is alert and oriented x4. No signs of acute distress noted.

## 2011-08-26 NOTE — ED Provider Notes (Signed)
History  This chart was scribed for Richardean Canal, MD by Erskine Emery. This patient was seen in room TR09C/TR09C and the patient's care was started at 18:19.   CSN: [id number]  Arrival date & time 08/26/11  1636   First MD Initiated Contact with Patient 08/26/11 1819      Chief Complaint  Patient presents with  . Numbness    (Consider location/radiation/quality/duration/timing/severity/associated sxs/prior treatment) The history is provided by the patient. No language interpreter was used.  Tracy Mercado is a 51 y.o. female who presents to the Emergency Department complaining of hand pain and tingling that radiates through the arms bilaterally with associated neck pain and significant sleep disturbance for the past 4 months. Pt denies any back pain or fever. Pt reports trying OTC pain relievers, including Advil pm with only mild relief from symptoms.  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 Pt denies seeing a physician previously for the complaint. Pt reports she smokes but does no drugs. Pt was driven here by her partner.  Past Medical History  Diagnosis Date  . Hypertension   . CAD (coronary artery disease)     Past Surgical History  Procedure Date  . Knee arthroscopy     No family history on file.  History  Substance Use Topics  . Smoking status: Current Everyday Smoker -- 0.5 packs/day  . Smokeless tobacco: Not on file  . Alcohol Use: Yes    OB History    Grav Para Term Preterm Abortions TAB SAB Ect Mult Living                  Review of Systems  Constitutional: Negative for fever and chills.  HENT: Positive for neck pain.   Respiratory: Negative for shortness of breath.   Gastrointestinal: Negative for nausea and vomiting.  Musculoskeletal: Negative for back pain.  Neurological: Positive for numbness (in hands and arms bilaterally). Negative for weakness.  Psychiatric/Behavioral:  Positive for disturbed wake/sleep cycle.    Allergies  Review of patient's allergies indicates no known allergies.  Home Medications   Current Outpatient Rx  Name Route Sig Dispense Refill  . ADVIL PM PO Oral Take 1 tablet by mouth at bedtime as needed. For pain/sleep      BP 146/88  Pulse 84  Temp 98.2 F (36.8 C) (Oral)  Resp 20  SpO2 97%  Physical Exam  Nursing note and vitals reviewed. Constitutional: She is oriented to person, place, and time. She appears well-developed and well-nourished. No distress.  HENT:  Head: Normocephalic and atraumatic.  Eyes: EOM are normal.  Neck: Neck supple. No tracheal deviation present.  Cardiovascular: Normal rate.   Pulmonary/Chest: Effort normal. No respiratory distress.  Musculoskeletal: She exhibits tenderness.       Spasms in the trapezius bilaterally. Normal elbow flexion and extension. Right hand: decreased hand grasp due to pain, normal sensation throughout, wrist normal ROM. Left hand: decreased hand grasp due to pain but everything else is normal. Good distal pulses. Positive tinnel sign, more on the right than the left.   Neurological: She is alert and oriented to person, place, and time.  Skin: Skin is warm and dry.  Psychiatric: She has a normal mood and affect. Her behavior is normal.    ED Course  Procedures (including critical care time) DIAGNOSTIC STUDIES: Oxygen Saturation is 97% on room air, adequate by my interpretation.  COORDINATION OF CARE: 18:25--I evaluated the patient and we discussed a treatment plan including neck and hand x-rays and pain medication to which the pt agreed. I told the pt that her symptoms may be due to carpal tunnel. I instructed the pt to follow up with an orthopedist.   Labs Reviewed - No data to display Dg Cervical Spine Complete  08/26/2011  *RADIOLOGY REPORT*  Clinical Data: 51 year old female with neck pain.  Numbness and tingling in both hands.  No known injury.  CERVICAL SPINE -  COMPLETE 4+ VIEW  Comparison: Brain MRI 09/25/2009.  Findings: Normal prevertebral soft tissue contours. Cervicothoracic junction alignment is within normal limits.  Relatively preserved disc spaces. Bilateral posterior element alignment is within normal limits.  AP alignment and lung apices within normal limits.  C1-C2 alignment and odontoid within normal limits.  IMPRESSION: Normal for age radiographic appearance of the cervical spine.   Original Report Authenticated By: Harley Hallmark, M.D.    Dg Hand Complete Left  08/26/2011  *RADIOLOGY REPORT*  Clinical Data: 51 year old female with hand pain.  Limited range of motion.  LEFT HAND - COMPLETE 3+ VIEW  Comparison: 12/17/2010.  Findings: Bone mineralization appears stable and within normal limits throughout.  Distal radius and ulna intact.  Carpal bone alignment within normal limits.  Joint spaces preserved.  No fracture dislocation.  IMPRESSION: No acute osseous abnormality identified about the left hand.   Original Report Authenticated By: Harley Hallmark, M.D.    Dg Hand Complete Right  08/26/2011  *RADIOLOGY REPORT*  Clinical Data: 51 year old female with hand pain.  Limited range of motion.  RIGHT HAND - COMPLETE 3+ VIEW  Comparison: None.  Findings: Chronic deformity of the fifth metacarpal.  Bone mineralization appears mildly heterogeneous, but there is no periosteal reaction or osteolysis.  Elsewhere Bone mineralization is within normal limits.  Distal radius and ulna intact.  Carpal bone alignment within normal limits. Joint spaces are preserved.  No acute fracture or dislocation.  IMPRESSION: Healed fracture of the fifth metacarpal. No acute osseous abnormality identified about the right hand.   Original Report Authenticated By: Harley Hallmark, M.D.      No diagnosis found.    MDM  Tracy Mercado is a 51 y.o. female here with bilateral hand pain and tingling and neck pain. Etiology of the hand pain can be secondary to cervical  radiculopathy vs carpal tunnel syndrome vs overuse (she is a Production designer, theatre/television/film at [employer]). Xrays showed healed 5th metacarpal fracture of R hand, nl L hand xray, and nl cervical xray. Pain improved with percocet. Will recommend that she f/u with ortho and be on light duty.    This document was completed by the scribe at my direction and I have reviewed its accuracy. I have personally examined the patient and agrees with the above document.   Chaney Malling, MD     Richardean Canal, MD 08/26/11 2001

## 2011-08-26 NOTE — ED Notes (Addendum)
Bilateral numbness and tingling in bilateral arms/hands. Rt. Worse than left for months.

## 2011-11-07 ENCOUNTER — Encounter (HOSPITAL_COMMUNITY): Payer: Self-pay | Admitting: *Deleted

## 2011-11-07 DIAGNOSIS — I10 Essential (primary) hypertension: Secondary | ICD-10-CM | POA: Insufficient documentation

## 2011-11-07 DIAGNOSIS — A638 Other specified predominantly sexually transmitted diseases: Secondary | ICD-10-CM | POA: Insufficient documentation

## 2011-11-07 DIAGNOSIS — R52 Pain, unspecified: Secondary | ICD-10-CM | POA: Insufficient documentation

## 2011-11-07 DIAGNOSIS — F17200 Nicotine dependence, unspecified, uncomplicated: Secondary | ICD-10-CM | POA: Insufficient documentation

## 2011-11-07 DIAGNOSIS — A599 Trichomoniasis, unspecified: Secondary | ICD-10-CM | POA: Insufficient documentation

## 2011-11-07 DIAGNOSIS — N390 Urinary tract infection, site not specified: Secondary | ICD-10-CM | POA: Insufficient documentation

## 2011-11-07 DIAGNOSIS — I2510 Atherosclerotic heart disease of native coronary artery without angina pectoris: Secondary | ICD-10-CM | POA: Insufficient documentation

## 2011-11-07 NOTE — ED Notes (Signed)
Pt reports having a fever, nausea, vomiting, body aches, and a "bad headache x 7 days. States that she took two tylenol PM prior to coming to the ER. Pt reports last episode of emesis was at 0400 today. Pt thinks she may have the flu.

## 2011-11-08 ENCOUNTER — Emergency Department (HOSPITAL_COMMUNITY)
Admission: EM | Admit: 2011-11-08 | Discharge: 2011-11-08 | Disposition: A | Discharge status: Home / Self Care | Payer: Self-pay | Attending: Emergency Medicine | Admitting: Emergency Medicine

## 2011-11-08 DIAGNOSIS — N390 Urinary tract infection, site not specified: Secondary | ICD-10-CM

## 2011-11-08 DIAGNOSIS — A64 Unspecified sexually transmitted disease: Secondary | ICD-10-CM

## 2011-11-08 DIAGNOSIS — A599 Trichomoniasis, unspecified: Secondary | ICD-10-CM

## 2011-11-08 LAB — URINALYSIS, ROUTINE W REFLEX MICROSCOPIC
Bilirubin Urine: NEGATIVE
Glucose, UA: NEGATIVE mg/dL
Specific Gravity, Urine: 1.007 (ref 1.005–1.030)
Urobilinogen, UA: 1 mg/dL (ref 0.0–1.0)
pH: 6.5 (ref 5.0–8.0)

## 2011-11-08 LAB — CBC WITH DIFFERENTIAL/PLATELET
Basophils Relative: 1 % (ref 0–1)
Eosinophils Relative: 0 % (ref 0–5)
HCT: 35.3 % — ABNORMAL LOW (ref 36.0–46.0)
Hemoglobin: 12.2 g/dL (ref 12.0–15.0)
Lymphs Abs: 2.3 10*3/uL (ref 0.7–4.0)
MCH: 33.1 pg (ref 26.0–34.0)
MCV: 95.7 fL (ref 78.0–100.0)
Monocytes Absolute: 2 10*3/uL — ABNORMAL HIGH (ref 0.1–1.0)
Monocytes Relative: 24 % — ABNORMAL HIGH (ref 3–12)
Platelets: 298 10*3/uL (ref 150–400)
RBC: 3.69 MIL/uL — ABNORMAL LOW (ref 3.87–5.11)
WBC: 8.5 10*3/uL (ref 4.0–10.5)

## 2011-11-08 LAB — URINE MICROSCOPIC-ADD ON

## 2011-11-08 LAB — POCT I-STAT, CHEM 8
BUN: 6 mg/dL (ref 6–23)
Calcium, Ion: 1.3 mmol/L — ABNORMAL HIGH (ref 1.12–1.23)
Chloride: 101 mEq/L (ref 96–112)
Creatinine, Ser: 0.8 mg/dL (ref 0.50–1.10)
TCO2: 24 mmol/L (ref 0–100)

## 2011-11-08 MED ORDER — METRONIDAZOLE 500 MG PO TABS
2000.0000 mg | ORAL_TABLET | Freq: Once | ORAL | Status: AC
  Administered 2011-11-08: 2000 mg via ORAL
  Filled 2011-11-08: qty 4

## 2011-11-08 MED ORDER — SODIUM CHLORIDE 0.9 % IV BOLUS (SEPSIS)
1000.0000 mL | Freq: Once | INTRAVENOUS | Status: AC
  Administered 2011-11-08: 1000 mL via INTRAVENOUS

## 2011-11-08 MED ORDER — POTASSIUM CHLORIDE 10 MEQ/100ML IV SOLN
10.0000 meq | Freq: Once | INTRAVENOUS | Status: AC
  Administered 2011-11-08: 10 meq via INTRAVENOUS
  Filled 2011-11-08: qty 100

## 2011-11-08 MED ORDER — POTASSIUM CHLORIDE CRYS ER 20 MEQ PO TBCR
40.0000 meq | EXTENDED_RELEASE_TABLET | Freq: Once | ORAL | Status: AC
  Administered 2011-11-08: 40 meq via ORAL
  Filled 2011-11-08: qty 2

## 2011-11-08 MED ORDER — KETOROLAC TROMETHAMINE 30 MG/ML IJ SOLN
30.0000 mg | Freq: Once | INTRAMUSCULAR | Status: AC
  Administered 2011-11-08: 30 mg via INTRAVENOUS
  Filled 2011-11-08: qty 1

## 2011-11-08 MED ORDER — CEFIXIME 400 MG PO TABS: 400.0000 mg | ORAL_TABLET | Freq: Every day | ORAL | Status: DC

## 2011-11-08 MED ORDER — AZITHROMYCIN 250 MG PO TABS
1000.0000 mg | ORAL_TABLET | Freq: Once | ORAL | Status: AC
  Administered 2011-11-08: 1000 mg via ORAL
  Filled 2011-11-08 (×2): qty 2

## 2011-11-08 MED ORDER — ONDANSETRON HCL 4 MG/2ML IJ SOLN
4.0000 mg | Freq: Once | INTRAMUSCULAR | Status: AC
  Administered 2011-11-08: 4 mg via INTRAVENOUS
  Filled 2011-11-08: qty 2

## 2011-11-08 MED ORDER — DEXTROSE 5 % IV SOLN
1.0000 g | Freq: Once | INTRAVENOUS | Status: AC
  Administered 2011-11-08: 1 g via INTRAVENOUS
  Filled 2011-11-08: qty 10

## 2011-11-08 NOTE — ED Provider Notes (Signed)
Medical screening examination/treatment/procedure(s) were performed by non-physician practitioner and as supervising physician I was immediately available for consultation/collaboration.  Shelda Jakes, MD 11/08/11 [phone]

## 2011-11-08 NOTE — ED Notes (Signed)
Attempted to collect urine specimen. Pt stated she will press the call bell when she is able to void

## 2011-11-08 NOTE — ED Notes (Signed)
Advised patient a urine sample is needed.

## 2011-11-08 NOTE — ED Provider Notes (Signed)
History     CSN: [id number]  Arrival date & time 11/07/11  2341   First MD Initiated Contact with Patient 11/08/11 0225      Chief Complaint  Patient presents with  . Emesis  . Generalized Body Aches  . Headache   HPI  History provided by the patient. Patient is a 51 year old female with history of hypertension who presents with complaints of fever, headache, general body aches and occasional nausea vomiting symptoms. Patient reports symptoms began gradually and have been waxing and waning for the past week. Patient has been using Tylenol PM as for fever symptoms with some improvement. Patient continues to feel poorly. She denies any recent travel or known sick contacts. Patient states that she feels like she may have the flu. She denies any cough symptoms. Denies any nasal congestion or rhinorrhea. Denies any diarrhea or abdominal pain. Patient denies any urinary complaints.      Past Medical History  Diagnosis Date  . Hypertension   . CAD (coronary artery disease)     Past Surgical History  Procedure Date  . Knee arthroscopy     No family history on file.  History  Substance Use Topics  . Smoking status: Current Every Day Smoker -- 0.5 packs/day  . Smokeless tobacco: Not on file  . Alcohol Use: Yes    OB History    Grav Para Term Preterm Abortions TAB SAB Ect Mult Living                  Review of Systems  Constitutional: Positive for fever, chills, diaphoresis, appetite change and fatigue.  HENT: Negative for congestion, sore throat and rhinorrhea.   Respiratory: Negative for cough and shortness of breath.   Cardiovascular: Negative for chest pain.  Gastrointestinal: Positive for nausea and vomiting. Negative for abdominal pain, diarrhea and constipation.  Genitourinary: Negative for dysuria, frequency, hematuria and flank pain.  Skin: Negative for rash.  Neurological: Positive for light-headedness and headaches. Negative for facial asymmetry, weakness and  numbness.  Psychiatric/Behavioral: Negative for confusion.    Allergies  Review of patient's allergies indicates no known allergies.  Home Medications   Current Outpatient Rx  Name  Route  Sig  Dispense  Refill  . TYLENOL PO   Oral   Take 2 tablets by mouth every 6 (six) hours as needed. For pain           BP 133/74  Pulse 69  Temp 98.9 F (37.2 C) (Oral)  Resp 18  Ht 5\' 7"  (1.702 m)  Wt 175 lb (79.379 kg)  BMI 27.41 kg/m2  SpO2 98%  Physical Exam  Nursing note and vitals reviewed. Constitutional: She is oriented to person, place, and time. She appears well-developed and well-nourished. No distress.  HENT:  Head: Normocephalic and atraumatic.  Mouth/Throat: Oropharynx is clear and moist.  Eyes: Conjunctivae normal and EOM are normal. Pupils are equal, round, and reactive to light.  Neck: Normal range of motion. Neck supple.       No meningeal signs  Cardiovascular: Normal rate and regular rhythm.   No murmur heard. Pulmonary/Chest: Effort normal and breath sounds normal. No respiratory distress. She has no wheezes. She has no rales.  Abdominal: Soft. There is no tenderness. There is no rigidity, no rebound, no guarding, no CVA tenderness and no tenderness at McBurney's point.  Lymphadenopathy:    She has no cervical adenopathy.  Neurological: She is alert and oriented to person, place, and time. She has normal  strength. No cranial nerve deficit or sensory deficit. Gait normal.  Skin: Skin is warm and dry. No rash noted.  Psychiatric: She has a normal mood and affect. Her behavior is normal.    ED Course  Procedures   Results for orders placed during the hospital encounter of 11/08/11  URINALYSIS, ROUTINE W REFLEX MICROSCOPIC      Component Value Range   Color, Urine YELLOW  YELLOW   APPearance CLOUDY (*) CLEAR   Specific Gravity, Urine 1.007  1.005 - 1.030   pH 6.5  5.0 - 8.0   Glucose, UA NEGATIVE  NEGATIVE mg/dL   Hgb urine dipstick TRACE (*) NEGATIVE    Bilirubin Urine NEGATIVE  NEGATIVE   Ketones, ur NEGATIVE  NEGATIVE mg/dL   Protein, ur NEGATIVE  NEGATIVE mg/dL   Urobilinogen, UA 1.0  0.0 - 1.0 mg/dL   Nitrite POSITIVE (*) NEGATIVE   Leukocytes, UA LARGE (*) NEGATIVE  CBC WITH DIFFERENTIAL      Component Value Range   WBC 8.5  4.0 - 10.5 K/uL   RBC 3.69 (*) 3.87 - 5.11 MIL/uL   Hemoglobin 12.2  12.0 - 15.0 g/dL   HCT 16.1 (*) 09.6 - 04.5 %   MCV 95.7  78.0 - 100.0 fL   MCH 33.1  26.0 - 34.0 pg   MCHC 34.6  30.0 - 36.0 g/dL   RDW 40.9  81.1 - 91.4 %   Platelets 298  150 - 400 K/uL   Neutrophils Relative 48  43 - 77 %   Lymphocytes Relative 27  12 - 46 %   Monocytes Relative 24 (*) 3 - 12 %   Eosinophils Relative 0  0 - 5 %   Basophils Relative 1  0 - 1 %   Neutro Abs 4.1  1.7 - 7.7 K/uL   Lymphs Abs 2.3  0.7 - 4.0 K/uL   Monocytes Absolute 2.0 (*) 0.1 - 1.0 K/uL   Eosinophils Absolute 0.0  0.0 - 0.7 K/uL   Basophils Absolute 0.1  0.0 - 0.1 K/uL   WBC Morphology ATYPICAL LYMPHOCYTES    POCT I-STAT, CHEM 8      Component Value Range   Sodium 138  135 - 145 mEq/L   Potassium 3.2 (*) 3.5 - 5.1 mEq/L   Chloride 101  96 - 112 mEq/L   BUN 6  6 - 23 mg/dL   Creatinine, Ser 7.82  0.50 - 1.10 mg/dL   Glucose, Bld 956 (*) 70 - 99 mg/dL   Calcium, Ion 2.13 (*) 1.12 - 1.23 mmol/L   TCO2 24  0 - 100 mmol/L   Hemoglobin 12.6  12.0 - 15.0 g/dL   HCT 08.6  57.8 - 46.9 %  URINE MICROSCOPIC-ADD ON      Component Value Range   Squamous Epithelial / LPF FEW (*) RARE   WBC, UA TOO NUMEROUS TO COUNT  <3 WBC/hpf   RBC / HPF 0-2  <3 RBC/hpf   Bacteria, UA MANY (*) RARE   Urine-Other FEW TRICHOMONAS         1. UTI (lower urinary tract infection)   2. Trichomonas   3. Sexually transmitted disease (STD)       MDM  2:30AM patient seen and evaluated. Patient no acute distress and well-appearing. Patient does not appear toxic.   Patient feeling better after IV fluids and medications. Labs showed slight hypokalemia otherwise  unremarkable. Urine showed signs for ear check infection as well as a few Trichomonas present.  I discussed findings with patient. She understands she will need further evaluation for possible STDs and to inform all of her partners. She received treatments today.     Angus Seller, [place] 11/08/11 [phone]

## 2011-11-09 LAB — GC/CHLAMYDIA PROBE AMP, URINE: GC Probe Amp, Urine: NEGATIVE

## 2011-11-10 LAB — URINE CULTURE

## 2011-11-11 NOTE — ED Notes (Signed)
+  Urine. Patient treated with Suprax. Sensitive to same. Per protocol MD. °

## 2012-06-02 ENCOUNTER — Emergency Department (HOSPITAL_COMMUNITY): Payer: No Typology Code available for payment source

## 2012-06-02 ENCOUNTER — Encounter (HOSPITAL_COMMUNITY): Payer: Self-pay | Admitting: *Deleted

## 2012-06-02 ENCOUNTER — Emergency Department (HOSPITAL_COMMUNITY)
Admission: EM | Admit: 2012-06-02 | Discharge: 2012-06-02 | Disposition: A | Discharge status: Home / Self Care | Payer: No Typology Code available for payment source | Attending: Emergency Medicine | Admitting: Emergency Medicine

## 2012-06-02 DIAGNOSIS — I2510 Atherosclerotic heart disease of native coronary artery without angina pectoris: Secondary | ICD-10-CM | POA: Insufficient documentation

## 2012-06-02 DIAGNOSIS — R51 Headache: Secondary | ICD-10-CM | POA: Insufficient documentation

## 2012-06-02 DIAGNOSIS — F411 Generalized anxiety disorder: Secondary | ICD-10-CM | POA: Insufficient documentation

## 2012-06-02 DIAGNOSIS — Z8673 Personal history of transient ischemic attack (TIA), and cerebral infarction without residual deficits: Secondary | ICD-10-CM | POA: Insufficient documentation

## 2012-06-02 DIAGNOSIS — F419 Anxiety disorder, unspecified: Secondary | ICD-10-CM

## 2012-06-02 DIAGNOSIS — I10 Essential (primary) hypertension: Secondary | ICD-10-CM | POA: Insufficient documentation

## 2012-06-02 DIAGNOSIS — R110 Nausea: Secondary | ICD-10-CM | POA: Insufficient documentation

## 2012-06-02 DIAGNOSIS — F17200 Nicotine dependence, unspecified, uncomplicated: Secondary | ICD-10-CM | POA: Insufficient documentation

## 2012-06-02 LAB — URINALYSIS, ROUTINE W REFLEX MICROSCOPIC
Ketones, ur: NEGATIVE mg/dL
Nitrite: NEGATIVE
Protein, ur: 30 mg/dL — AB

## 2012-06-02 LAB — CBC WITH DIFFERENTIAL/PLATELET
Basophils Absolute: 0 10*3/uL (ref 0.0–0.1)
Basophils Relative: 1 % (ref 0–1)
Eosinophils Absolute: 0 10*3/uL (ref 0.0–0.7)
Eosinophils Relative: 1 % (ref 0–5)
HCT: 42.9 % (ref 36.0–46.0)
Hemoglobin: 15.3 g/dL — ABNORMAL HIGH (ref 12.0–15.0)
MCH: 34.5 pg — ABNORMAL HIGH (ref 26.0–34.0)
MCHC: 35.7 g/dL (ref 30.0–36.0)
Monocytes Absolute: 1.1 10*3/uL — ABNORMAL HIGH (ref 0.1–1.0)
Platelets: 165 10*3/uL (ref 150–400)
RBC: 4.44 MIL/uL (ref 3.87–5.11)
RDW: 12.4 % (ref 11.5–15.5)

## 2012-06-02 LAB — BASIC METABOLIC PANEL
Chloride: 97 mEq/L (ref 96–112)
GFR calc Af Amer: >90 mL/min (ref >90)
GFR calc non Af Amer: >90 mL/min (ref >90)
Potassium: 3.6 mEq/L (ref 3.5–5.1)
Sodium: 133 mEq/L — ABNORMAL LOW (ref 135–145)

## 2012-06-02 LAB — URINE MICROSCOPIC-ADD ON

## 2012-06-02 MED ORDER — HYDROCHLOROTHIAZIDE 25 MG PO TABS: 25.0000 mg | ORAL_TABLET | Freq: Every day | ORAL | Status: DC

## 2012-06-02 MED ORDER — ALPRAZOLAM 0.25 MG PO TABS: 0.2500 mg | ORAL_TABLET | Freq: Three times a day (TID) | ORAL | Status: DC | PRN

## 2012-06-02 MED ORDER — LORAZEPAM 2 MG/ML IJ SOLN
0.5000 mg | Freq: Once | INTRAMUSCULAR | Status: AC
  Administered 2012-06-02: 0.5 mg via INTRAVENOUS
  Filled 2012-06-02: qty 1

## 2012-06-02 MED ORDER — KETOROLAC TROMETHAMINE 30 MG/ML IJ SOLN
30.0000 mg | Freq: Once | INTRAMUSCULAR | Status: AC
  Administered 2012-06-02: 30 mg via INTRAVENOUS
  Filled 2012-06-02: qty 1

## 2012-06-02 MED ORDER — HYDROCHLOROTHIAZIDE 12.5 MG PO CAPS
12.5000 mg | ORAL_CAPSULE | Freq: Once | ORAL | Status: AC
  Administered 2012-06-02: 12.5 mg via ORAL
  Filled 2012-06-02: qty 1

## 2012-06-02 MED ORDER — LISINOPRIL 20 MG PO TABS: 20.0000 mg | ORAL_TABLET | Freq: Every day | ORAL | Status: DC

## 2012-06-02 MED ORDER — METOCLOPRAMIDE HCL 5 MG/ML IJ SOLN
10.0000 mg | Freq: Once | INTRAMUSCULAR | Status: AC
  Administered 2012-06-02: 10 mg via INTRAVENOUS
  Filled 2012-06-02: qty 2

## 2012-06-02 MED ORDER — DIPHENHYDRAMINE HCL 50 MG/ML IJ SOLN
25.0000 mg | Freq: Once | INTRAMUSCULAR | Status: AC
  Administered 2012-06-02: 25 mg via INTRAVENOUS
  Filled 2012-06-02: qty 1

## 2012-06-02 MED ORDER — LISINOPRIL 10 MG PO TABS
10.0000 mg | ORAL_TABLET | Freq: Once | ORAL | Status: AC
  Administered 2012-06-02: 10 mg via ORAL
  Filled 2012-06-02: qty 1

## 2012-06-02 NOTE — ED Notes (Signed)
Pt states has had a severe headache since this morning, states has had n/v, sensitivity to light and sound, hx of HTN but does not take medication.

## 2012-06-02 NOTE — ED Notes (Signed)
Pt states she has had a headache since last night, bp elevated, pts visitor states she has not been herself, lack of appetite and nausea

## 2012-06-02 NOTE — ED Provider Notes (Signed)
History     CSN: [id number]  Arrival date & time 06/02/12  0865   First MD Initiated Contact with Patient 06/02/12 1934      Chief Complaint  Patient presents with  . Hypertension  . Headache    (Consider location/radiation/quality/duration/timing/severity/associated sxs/prior treatment) The history is provided by the patient.  DELAYNA SPARLIN is a 52 y.o. female history of hypertension, CAD, stroke here presenting with headache. She has history of headaches and worsening headaches since yesterday. Gradual onset. She has been more stressed out at work recently. As per her female partner, she has more nausea and lack of appetite today. Her blood pressure also has been elevated and she has been off her BP meds for a year. Denies weakness or numbness or chest pain or SOB or abdominal pain or urinary symptoms.    Past Medical History  Diagnosis Date  . Hypertension   . CAD (coronary artery disease)     Past Surgical History  Procedure Laterality Date  . Knee arthroscopy      No family history on file.  History  Substance Use Topics  . Smoking status: Current Every Day Smoker -- 0.50 packs/day  . Smokeless tobacco: Not on file  . Alcohol Use: Yes    OB History   Grav Para Term Preterm Abortions TAB SAB Ect Mult Living                  Review of Systems  Gastrointestinal: Positive for nausea.  Neurological: Positive for headaches.  All other systems reviewed and are negative.    Allergies  Review of patient's allergies indicates no known allergies.  Home Medications   Current Outpatient Rx  Name  Route  Sig  Dispense  Refill  . Acetaminophen (TYLENOL PO)   Oral   Take 2 tablets by mouth every 6 (six) hours as needed. For pain         . ibuprofen (ADVIL,MOTRIN) 200 MG tablet   Oral   Take 200 mg by mouth every 6 (six) hours as needed for pain.           BP 157/106  Pulse 65  Temp(Src) 98.6 F (37 C) (Oral)  Resp 14  Ht 5\' 6"  (1.676 m)  Wt 175  lb (79.379 kg)  BMI 28.26 kg/m2  SpO2 100%  Physical Exam  Nursing note and vitals reviewed. Constitutional: She is oriented to person, place, and time. She appears well-developed and well-nourished.  Slightly uncomfortable   HENT:  Head: Normocephalic.  Mouth/Throat: Oropharynx is clear and moist.  Eyes: Conjunctivae are normal. Pupils are equal, round, and reactive to light.  Neck: Normal range of motion. Neck supple.  Cardiovascular: Normal rate, regular rhythm and normal heart sounds.   Pulmonary/Chest: Effort normal and breath sounds normal. No respiratory distress. She has no wheezes. She has no rales.  Abdominal: Soft. Bowel sounds are normal. She exhibits no distension. There is no tenderness. There is no rebound and no guarding.  Musculoskeletal: Normal range of motion.  Neurological: She is alert and oriented to person, place, and time. She displays normal reflexes. No cranial nerve deficit. Coordination normal.  Nl finger to nose, nl strength and sensation throughout   Skin: Skin is warm and dry.  Psychiatric: She has a normal mood and affect. Her behavior is normal. Judgment and thought content normal.    ED Course  Procedures (including critical care time)  Labs Reviewed  BASIC METABOLIC PANEL - Abnormal; Notable for the  following:    Sodium 133 (*)    Glucose, Bld 115 (*)    All other components within normal limits  CBC WITH DIFFERENTIAL - Abnormal; Notable for the following:    Hemoglobin 15.3 (*)    MCH 34.5 (*)    Neutrophils Relative % 33 (*)    Neutro Abs 1.4 (*)    Monocytes Relative 25 (*)    Monocytes Absolute 1.1 (*)    All other components within normal limits  URINALYSIS, ROUTINE W REFLEX MICROSCOPIC - Abnormal; Notable for the following:    Color, Urine AMBER (*)    Hgb urine dipstick SMALL (*)    Bilirubin Urine SMALL (*)    Protein, ur 30 (*)    Leukocytes, UA TRACE (*)    All other components within normal limits  URINE MICROSCOPIC-ADD ON -  Abnormal; Notable for the following:    Squamous Epithelial / LPF FEW (*)    All other components within normal limits   Ct Head Wo Contrast  06/02/2012   *RADIOLOGY REPORT*  Clinical Data: Headache with elevated blood pressure, nausea and lack of appetite.  CT HEAD WITHOUT CONTRAST  Technique:  Contiguous axial images were obtained from the base of the skull through the vertex without contrast.  Comparison: MRI brain 09/25/2009.  Head CT 09/24/2009.  Findings: There is no evidence of acute intracranial hemorrhage, mass lesion, brain edema or extra-axial fluid collection.  The ventricles and subarachnoid spaces are appropriately sized for age. There is no CT evidence of acute cortical infarction.  The visualized paranasal sinuses are clear. The calvarium is intact.  IMPRESSION: Unremarkable noncontrast head CT.  No acute intracranial findings.   Original Report Authenticated By: Carey Bullocks, M.D.     No diagnosis found.    MDM  KRIS BURD is a 52 y.o. female here with HTN, headache. Will need to r/o head bleed. I think its likely migraines from stress. Will do CT head and treat symptomatically. Not concerned for subarachnoid.  9:51 PM Labs unremarkable. BP dec to 150/100 after HCTZ and lisinopril. UA contaminated and she has no symptoms. Will d/c home on BP meds. I recommend that she finds a PMD for f/u.         Richardean Canal, MD 06/02/12 2152

## 2012-07-16 ENCOUNTER — Encounter (HOSPITAL_COMMUNITY): Payer: Self-pay | Admitting: Emergency Medicine

## 2012-07-16 ENCOUNTER — Emergency Department (INDEPENDENT_AMBULATORY_CARE_PROVIDER_SITE_OTHER)
Admission: EM | Admit: 2012-07-16 | Discharge: 2012-07-16 | Disposition: A | Discharge status: Home / Self Care | Payer: No Typology Code available for payment source | Source: Home / Self Care

## 2012-07-16 ENCOUNTER — Emergency Department (INDEPENDENT_AMBULATORY_CARE_PROVIDER_SITE_OTHER): Payer: No Typology Code available for payment source

## 2012-07-16 DIAGNOSIS — J309 Allergic rhinitis, unspecified: Secondary | ICD-10-CM

## 2012-07-16 MED ORDER — HYDROCOD POLST-CHLORPHEN POLST 10-8 MG/5ML PO LQCR: 5.0000 mL | Freq: Two times a day (BID) | ORAL | Status: DC | PRN

## 2012-07-16 MED ORDER — FLUTICASONE PROPIONATE 50 MCG/ACT NA SUSP: 2.0000 | Freq: Every day | NASAL | Status: DC

## 2012-07-16 MED ORDER — AZITHROMYCIN 250 MG PO TABS: ORAL_TABLET | ORAL | Status: DC

## 2012-07-16 MED ORDER — FEXOFENADINE-PSEUDOEPHED ER 60-120 MG PO TB12: 1.0000 | ORAL_TABLET | Freq: Two times a day (BID) | ORAL | Status: DC

## 2012-07-16 NOTE — ED Provider Notes (Signed)
Medical screening examination/treatment/procedure(s) were performed by non-physician practitioner and as supervising physician I was immediately available for consultation/collaboration.  Joseandres Mazer, M.D.  Primus Gritton C Blaire Palomino, MD 07/16/12 2058 

## 2012-07-16 NOTE — ED Provider Notes (Signed)
History    CSN: [id number] Arrival date & time 07/16/12  1816  First MD Initiated Contact with Patient 07/16/12 2015     Chief Complaint  Patient presents with  . Cough   (Consider location/radiation/quality/duration/timing/severity/associated sxs/prior Treatment) HPI  52 yo bf presents today with the above complaint.  States that nasal/chest congestion, cough, postnasal drainage, and sore throat ongoing x 2 weeks.  Productive cough and nasal drainage clear.  Denies fever, chills, cp, sob.  Hx of smoking and question seasonal allergies.   Past Medical History  Diagnosis Date  . Hypertension   . CAD (coronary artery disease)    Past Surgical History  Procedure Laterality Date  . Knee arthroscopy     No family history on file. History  Substance Use Topics  . Smoking status: Current Every Day Smoker -- 0.50 packs/day  . Smokeless tobacco: Not on file  . Alcohol Use: Yes   OB History   Grav Para Term Preterm Abortions TAB SAB Ect Mult Living                 Review of Systems  Constitutional: Negative.   HENT: Positive for congestion, rhinorrhea and postnasal drip. Negative for hearing loss, ear pain, nosebleeds, neck stiffness and ear discharge.   Eyes: Negative.   Respiratory: Positive for cough. Negative for choking, chest tightness, shortness of breath and wheezing.   Cardiovascular: Negative.   Gastrointestinal: Negative.   Endocrine: Negative.   Genitourinary: Negative.   Musculoskeletal: Negative.   Skin: Negative.   Hematological: Negative.   Psychiatric/Behavioral: Negative.     Allergies  Review of patient's allergies indicates no known allergies.  Home Medications   Current Outpatient Rx  Name  Route  Sig  Dispense  Refill  . hydrochlorothiazide (HYDRODIURIL) 25 MG tablet   Oral   Take 1 tablet (25 mg total) by mouth daily.   30 tablet   0   . lisinopril (PRINIVIL,ZESTRIL) 20 MG tablet   Oral   Take 1 tablet (20 mg total) by mouth daily.   30  tablet   0   . Acetaminophen (TYLENOL PO)   Oral   Take 2 tablets by mouth every 6 (six) hours as needed. For pain         . ALPRAZolam (XANAX) 0.25 MG tablet   Oral   Take 1 tablet (0.25 mg total) by mouth 3 (three) times daily as needed for anxiety.   15 tablet   0   . azithromycin (ZITHROMAX) 250 MG tablet      5 day dose pack.  Take as directed.   6 tablet   0   . chlorpheniramine-HYDROcodone (TUSSIONEX PENNKINETIC ER) 10-8 MG/5ML LQCR   Oral   Take 5 mLs by mouth every 12 (twelve) hours as needed (cough.  do not drive or consume alcoholic beverages when using this medication.).   140 mL   0   . fexofenadine-pseudoephedrine (ALLEGRA-D) 60-120 MG per tablet   Oral   Take 1 tablet by mouth every 12 (twelve) hours.   30 tablet   0   . fluticasone (FLONASE) 50 MCG/ACT nasal spray   Nasal   Place 2 sprays into the nose daily.   16 g   1   . ibuprofen (ADVIL,MOTRIN) 200 MG tablet   Oral   Take 200 mg by mouth every 6 (six) hours as needed for pain.          BP 149/86  Pulse 79  Temp(Src)  98.4 F (36.9 C) (Oral)  Resp 20  SpO2 96% Physical Exam  Constitutional: She is oriented to person, place, and time. She appears well-developed and well-nourished.  HENT:  Head: Normocephalic and atraumatic.  Mouth/Throat: Oropharynx is clear and moist.  Frontal and maxillary sinus tenderness.   Eyes: EOM are normal. Pupils are equal, round, and reactive to light.  Neck: Normal range of motion.  Cardiovascular: Normal rate and regular rhythm.   Pulmonary/Chest: Effort normal and breath sounds normal.  Musculoskeletal: Normal range of motion.  Neurological: She is alert and oriented to person, place, and time.  Skin: Skin is warm.    ED Course  Procedures (including critical care time) Labs Reviewed  POCT RAPID STREP A ([facility] CARE ONLY)   Dg Chest 2 View  07/16/2012   *RADIOLOGY REPORT*  Clinical Data: Productive cough.  Coronary artery disease. Hypertension.   CHEST - 2 VIEW  Comparison: 06/15/2011  Findings: Cardiac and mediastinal contours appear normal.  The lungs appear clear.  No pleural effusion is identified.  Mild lower thoracic spondylosis noted.  IMPRESSION:  1.  No acute findings.   Original Report Authenticated By: Gaylyn Rong, M.D.   1. Allergic rhinitis     MDM  Patient will f/u in a few days if not better.  Given a script for zpack but will hold for now and only start if symptoms worsen while taking the allergy medication.  Voices understanding.  All questions answered.    Meds ordered this encounter  Medications  . fexofenadine-pseudoephedrine (ALLEGRA-D) 60-120 MG per tablet    Sig: Take 1 tablet by mouth every 12 (twelve) hours.    Dispense:  30 tablet    Refill:  0  . fluticasone (FLONASE) 50 MCG/ACT nasal spray    Sig: Place 2 sprays into the nose daily.    Dispense:  16 g    Refill:  1  . chlorpheniramine-HYDROcodone (TUSSIONEX PENNKINETIC ER) 10-8 MG/5ML LQCR    Sig: Take 5 mLs by mouth every 12 (twelve) hours as needed (cough.  do not drive or consume alcoholic beverages when using this medication.).    Dispense:  140 mL    Refill:  0  . azithromycin (ZITHROMAX) 250 MG tablet    Sig: 5 day dose pack.  Take as directed.    Dispense:  6 tablet    Refill:  0    Zonia Kief, PA-C 07/16/12 2043

## 2012-07-16 NOTE — ED Notes (Signed)
Pt c/o persistent productive cough onset 2 weeks... sxs also include: chest tightness due to cough, ST due to cough, vomiting due to cough, SOB.... Smokes 0.5 PPD... Denies f/d... Alert w/no signs of acute distress.

## 2012-07-18 LAB — CULTURE, GROUP A STREP

## 2013-07-25 ENCOUNTER — Encounter (HOSPITAL_COMMUNITY): Payer: Self-pay | Admitting: Emergency Medicine

## 2013-07-25 ENCOUNTER — Emergency Department (HOSPITAL_COMMUNITY)
Admission: EM | Admit: 2013-07-25 | Discharge: 2013-07-25 | Disposition: A | Discharge status: Home / Self Care | Payer: No Typology Code available for payment source | Attending: Emergency Medicine | Admitting: Emergency Medicine

## 2013-07-25 DIAGNOSIS — Y9289 Other specified places as the place of occurrence of the external cause: Secondary | ICD-10-CM | POA: Insufficient documentation

## 2013-07-25 DIAGNOSIS — T6391XA Toxic effect of contact with unspecified venomous animal, accidental (unintentional), initial encounter: Secondary | ICD-10-CM | POA: Insufficient documentation

## 2013-07-25 DIAGNOSIS — T63464A Toxic effect of venom of wasps, undetermined, initial encounter: Secondary | ICD-10-CM

## 2013-07-25 DIAGNOSIS — Y9389 Activity, other specified: Secondary | ICD-10-CM | POA: Insufficient documentation

## 2013-07-25 DIAGNOSIS — I10 Essential (primary) hypertension: Secondary | ICD-10-CM | POA: Insufficient documentation

## 2013-07-25 DIAGNOSIS — F17200 Nicotine dependence, unspecified, uncomplicated: Secondary | ICD-10-CM | POA: Insufficient documentation

## 2013-07-25 DIAGNOSIS — I2510 Atherosclerotic heart disease of native coronary artery without angina pectoris: Secondary | ICD-10-CM | POA: Insufficient documentation

## 2013-07-25 DIAGNOSIS — IMO0002 Reserved for concepts with insufficient information to code with codable children: Secondary | ICD-10-CM | POA: Insufficient documentation

## 2013-07-25 DIAGNOSIS — T63461A Toxic effect of venom of wasps, accidental (unintentional), initial encounter: Secondary | ICD-10-CM | POA: Insufficient documentation

## 2013-07-25 DIAGNOSIS — Z79899 Other long term (current) drug therapy: Secondary | ICD-10-CM | POA: Insufficient documentation

## 2013-07-25 MED ORDER — PREDNISONE 20 MG PO TABS
60.0000 mg | ORAL_TABLET | Freq: Once | ORAL | Status: AC
  Administered 2013-07-25: 60 mg via ORAL
  Filled 2013-07-25: qty 3

## 2013-07-25 MED ORDER — PREDNISONE 20 MG PO TABS: 40.0000 mg | ORAL_TABLET | Freq: Every day | ORAL | Status: DC

## 2013-07-25 MED ORDER — FAMOTIDINE 20 MG PO TABS
40.0000 mg | ORAL_TABLET | Freq: Once | ORAL | Status: AC
  Administered 2013-07-25: 40 mg via ORAL
  Filled 2013-07-25: qty 2

## 2013-07-25 NOTE — ED Notes (Signed)
Pt was out in utility building, moved a chair and "a swarm of I think wasps came out and stung me in my face." Unsure how many times she was stung. Was stung on the left side of the face and on the left arm. Has had one 24 oz and two 7oz beers. Ambulatory and moving all extremities. Neurologically intact. Face slightly swollen on left side. Took one capsule of benadryl before arriving at the hospital. Hasn't taken any other medications for swelling or pain today. Speaking full, clear sentences. In NAD. Awaiting PA.

## 2013-07-25 NOTE — ED Provider Notes (Signed)
CSN: [id number]     Arrival date & time 07/25/13  1820 History  This chart was scribed for non-physician provider Emilia Beck, PA-C, working with Rolland Porter, MD by Phillis Haggis, ED Scribe. This patient was seen in room WTR5/WTR5 and patient care was started at [facility] PM.   Chief Complaint  Patient presents with  . Insect Bite   Patient is a 53 y.o. female presenting with animal bite. The history is provided by the patient. No language interpreter was used.  Animal Bite Contact animal:  Insect Location:  Face and shoulder/arm Facial injury location:  Upper lip, lower lip and face Shoulder/arm injury location:  L arm Time since incident:  1 hour Pain details:    Quality:  Burning   Timing:  Constant Incident location:  Another residence Ineffective treatments:  OTC medications Associated symptoms: rash    HPI Comments: Tracy Mercado is a 53 y.o. female who presents to the Emergency Department complaining of an insect bite onset 1 hour ago. She states that she was moving some chairs, doing some outdoor work, when she was stung by wasps on the lips, face, and arm. She reports swelling and burning pain in her face, with associated shortness of breath. She reports that she took Benadryl for the reaction to no relief. She states that she has been stung before.   Past Medical History  Diagnosis Date  . Hypertension   . CAD (coronary artery disease)    Past Surgical History  Procedure Laterality Date  . Knee arthroscopy     History reviewed. No pertinent family history. History  Substance Use Topics  . Smoking status: Current Every Day Smoker -- 0.50 packs/day  . Smokeless tobacco: Not on file  . Alcohol Use: Yes   OB History   Grav Para Term Preterm Abortions TAB SAB Ect Mult Living                 Review of Systems  HENT: Positive for facial swelling.   Respiratory: Positive for shortness of breath.   Skin: Positive for rash.  All other systems reviewed and are  negative.  Allergies  Review of patient's allergies indicates no known allergies.  Home Medications   Prior to Admission medications   Medication Sig Start Date End Date Taking? Authorizing Provider  Acetaminophen (TYLENOL PO) Take 2 tablets by mouth every 6 (six) hours as needed. For pain    Historical Provider, MD  ALPRAZolam (XANAX) 0.25 MG tablet Take 1 tablet (0.25 mg total) by mouth 3 (three) times daily as needed for anxiety. 06/02/12   Richardean Canal, MD  azithromycin (ZITHROMAX) 250 MG tablet 5 day dose pack.  Take as directed. 07/16/12   Zonia Kief, PA-C  chlorpheniramine-HYDROcodone (TUSSIONEX PENNKINETIC ER) 10-8 MG/5ML LQCR Take 5 mLs by mouth every 12 (twelve) hours as needed (cough.  do not drive or consume alcoholic beverages when using this medication.). 07/16/12   Zonia Kief, PA-C  fexofenadine-pseudoephedrine (ALLEGRA-D) 60-120 MG per tablet Take 1 tablet by mouth every 12 (twelve) hours. 07/16/12   Zonia Kief, PA-C  fluticasone (FLONASE) 50 MCG/ACT nasal spray Place 2 sprays into the nose daily. 07/16/12   Zonia Kief, PA-C  hydrochlorothiazide (HYDRODIURIL) 25 MG tablet Take 1 tablet (25 mg total) by mouth daily. 06/02/12   Richardean Canal, MD  ibuprofen (ADVIL,MOTRIN) 200 MG tablet Take 200 mg by mouth every 6 (six) hours as needed for pain.    Historical Provider, MD  lisinopril (PRINIVIL,ZESTRIL) 20  MG tablet Take 1 tablet (20 mg total) by mouth daily. 06/02/12   Richardean Canalavid H Yao, MD   BP 121/82  Pulse 70  Temp(Src) 98.1 F (36.7 C) (Oral)  Resp 19  SpO2 100% Physical Exam  Nursing note and vitals reviewed. Constitutional: She is oriented to person, place, and time. She appears well-developed and well-nourished. No distress.  HENT:  Head: Normocephalic and atraumatic.  Mouth/Throat: Oropharynx is clear and moist. No oropharyngeal exudate.  Left side facial swelling of cheek and mandible area. No periorbital swelling. No erythema or tenderness to palpation. No stinger noted in  skin.   Eyes: EOM are normal.  Neck: Normal range of motion. Neck supple.  Cardiovascular: Normal rate and regular rhythm.  Exam reveals no gallop and no friction rub.   No murmur heard. Pulmonary/Chest: Effort normal and breath sounds normal. She has no wheezes. She has no rales. She exhibits no tenderness.  Musculoskeletal: Normal range of motion.  Neurological: She is alert and oriented to person, place, and time.  Speech is goal-oriented. Moves limbs without ataxia.   Skin: Skin is warm and dry.  Psychiatric: She has a normal mood and affect. Her behavior is normal.    ED Course  Procedures (including critical care time) DIAGNOSTIC STUDIES: Oxygen Saturation is 100% on room air, normal by my interpretation.    COORDINATION OF CARE: 6:56 PM-Discussed treatment plan which includes prednisone with pt at bedside and pt agreed to plan.   Labs Review Labs Reviewed - No data to display  Imaging Review No results found.   EKG Interpretation None      MDM   Final diagnoses:  Wasp sting, undetermined intent, initial encounter    7:56 PM Patient given prednisone and pepcid here for allergic reaction. Patient will be discharged with a course of steroids. Vitals stable and patient afebrile. Patient reports feeling better after the medication administered here. Patient instructed to return with worsening or concerning symptoms. No signs of facial cellulitis noticed at this time.   I personally performed the services described in this documentation, which was scribed in my presence. The recorded information has been reviewed and is accurate.      Emilia BeckKaitlyn Naara Kelty, PA-C 07/25/13 2004

## 2013-07-25 NOTE — Discharge Instructions (Signed)
Take prednisone as directed until gone. Refer to attached documents for more information. Return to the ED with worsening or concerning symptoms.  °

## 2013-08-02 NOTE — ED Provider Notes (Signed)
Medical screening examination/treatment/procedure(s) were performed by non-physician practitioner and as supervising physician I was immediately available for consultation/collaboration.   EKG Interpretation None        Rolland PorterMark Christiaan Strebeck, MD 08/02/13 [id number]

## 2013-11-25 ENCOUNTER — Emergency Department (HOSPITAL_COMMUNITY)
Admission: EM | Admit: 2013-11-25 | Discharge: 2013-11-26 | Disposition: A | Discharge status: Home / Self Care | Payer: No Typology Code available for payment source | Attending: Emergency Medicine | Admitting: Emergency Medicine

## 2013-11-25 ENCOUNTER — Encounter (HOSPITAL_COMMUNITY): Payer: Self-pay | Admitting: Nurse Practitioner

## 2013-11-25 ENCOUNTER — Emergency Department (HOSPITAL_COMMUNITY): Payer: No Typology Code available for payment source

## 2013-11-25 DIAGNOSIS — Z79899 Other long term (current) drug therapy: Secondary | ICD-10-CM | POA: Diagnosis not present

## 2013-11-25 DIAGNOSIS — Z720 Tobacco use: Secondary | ICD-10-CM | POA: Insufficient documentation

## 2013-11-25 DIAGNOSIS — Y92018 Other place in single-family (private) house as the place of occurrence of the external cause: Secondary | ICD-10-CM | POA: Diagnosis not present

## 2013-11-25 DIAGNOSIS — Z7952 Long term (current) use of systemic steroids: Secondary | ICD-10-CM | POA: Insufficient documentation

## 2013-11-25 DIAGNOSIS — I2510 Atherosclerotic heart disease of native coronary artery without angina pectoris: Secondary | ICD-10-CM | POA: Diagnosis not present

## 2013-11-25 DIAGNOSIS — S99929A Unspecified injury of unspecified foot, initial encounter: Secondary | ICD-10-CM

## 2013-11-25 DIAGNOSIS — W228XXA Striking against or struck by other objects, initial encounter: Secondary | ICD-10-CM | POA: Diagnosis not present

## 2013-11-25 DIAGNOSIS — Y9389 Activity, other specified: Secondary | ICD-10-CM | POA: Diagnosis not present

## 2013-11-25 DIAGNOSIS — S92511A Displaced fracture of proximal phalanx of right lesser toe(s), initial encounter for closed fracture: Secondary | ICD-10-CM | POA: Diagnosis not present

## 2013-11-25 DIAGNOSIS — Y998 Other external cause status: Secondary | ICD-10-CM | POA: Diagnosis not present

## 2013-11-25 DIAGNOSIS — S92501A Displaced unspecified fracture of right lesser toe(s), initial encounter for closed fracture: Secondary | ICD-10-CM

## 2013-11-25 DIAGNOSIS — I10 Essential (primary) hypertension: Secondary | ICD-10-CM | POA: Insufficient documentation

## 2013-11-25 DIAGNOSIS — S99921A Unspecified injury of right foot, initial encounter: Secondary | ICD-10-CM | POA: Diagnosis present

## 2013-11-25 DIAGNOSIS — Z7951 Long term (current) use of inhaled steroids: Secondary | ICD-10-CM | POA: Diagnosis not present

## 2013-11-25 MED ORDER — OXYCODONE-ACETAMINOPHEN 5-325 MG PO TABS
1.0000 | ORAL_TABLET | Freq: Once | ORAL | Status: AC
Start: 2013-11-25 — End: 2013-11-25
  Administered 2013-11-25: 1 via ORAL
  Filled 2013-11-25: qty 1

## 2013-11-25 NOTE — ED Notes (Signed)
Pt presents with c/o of toe pain right foot, 4th and 5th toe secondary to an injury at home. Denies being on blood thinners, rates pain 10/10.

## 2013-11-25 NOTE — ED Provider Notes (Signed)
CSN: [id number]     Arrival date & time 11/25/13  2213 History  This chart was scribed for non-physician practitioner, Jaynie Crumbleatyana Analys Ryden, PA-C working with Olivia Mackielga M Otter, MD by Angelene GiovanniEmmanuella Mensah, ED Scribe. The patient was seen in room WTR7/WTR7 and the patient's care was started at 11:12 PM    Chief Complaint  Patient presents with  . Toe Pain    Right foot 4th and 5th toes   The history is provided by the patient. No language interpreter was used.   HPI Comments: Tracy Mercado is a 53 y.o. female who presents to the Emergency Department complaining of a severe right 4th and 5th toe pain after she hit her foot at home about 2 hours ago. States pain to the foot. Swelling. Tried to apply some ice. States she is able to bear weight on the leg, but not on her toes. Denies any numbness or weakness to the foot or toes. No prior injuries. No other complaints.  Past Medical History  Diagnosis Date  . Hypertension   . CAD (coronary artery disease)    Past Surgical History  Procedure Laterality Date  . Knee arthroscopy     History reviewed. No pertinent family history. History  Substance Use Topics  . Smoking status: Current Every Day Smoker -- 0.50 packs/day  . Smokeless tobacco: Not on file  . Alcohol Use: Yes   OB History    No data available     Review of Systems  Constitutional: Negative for fever.  Musculoskeletal: Positive for joint swelling and arthralgias (right toe).      Allergies  Review of patient's allergies indicates no known allergies.  Home Medications   Prior to Admission medications   Medication Sig Start Date End Date Taking? Authorizing Provider  Acetaminophen (TYLENOL PO) Take 2 tablets by mouth every 6 (six) hours as needed. For pain    Historical Provider, MD  ALPRAZolam (XANAX) 0.25 MG tablet Take 1 tablet (0.25 mg total) by mouth 3 (three) times daily as needed for anxiety. 06/02/12   Richardean Canalavid H Yao, MD  azithromycin (ZITHROMAX) 250 MG tablet 5 day  dose pack.  Take as directed. 07/16/12   Zonia KiefJames Owens, PA-C  chlorpheniramine-HYDROcodone (TUSSIONEX PENNKINETIC ER) 10-8 MG/5ML LQCR Take 5 mLs by mouth every 12 (twelve) hours as needed (cough.  do not drive or consume alcoholic beverages when using this medication.). 07/16/12   Zonia KiefJames Owens, PA-C  fexofenadine-pseudoephedrine (ALLEGRA-D) 60-120 MG per tablet Take 1 tablet by mouth every 12 (twelve) hours. 07/16/12   Zonia KiefJames Owens, PA-C  fluticasone (FLONASE) 50 MCG/ACT nasal spray Place 2 sprays into the nose daily. 07/16/12   Zonia KiefJames Owens, PA-C  hydrochlorothiazide (HYDRODIURIL) 25 MG tablet Take 1 tablet (25 mg total) by mouth daily. 06/02/12   Richardean Canalavid H Yao, MD  ibuprofen (ADVIL,MOTRIN) 200 MG tablet Take 200 mg by mouth every 6 (six) hours as needed for pain.    Historical Provider, MD  lisinopril (PRINIVIL,ZESTRIL) 20 MG tablet Take 1 tablet (20 mg total) by mouth daily. 06/02/12   Richardean Canalavid H Yao, MD  predniSONE (DELTASONE) 20 MG tablet Take 2 tablets (40 mg total) by mouth daily. 07/25/13   Kaitlyn Szekalski, PA-C   BP 118/79 mmHg  Pulse 74  Temp(Src) 98 F (36.7 C) (Oral)  Resp 14  Ht 5\' 6"  (1.676 m)  Wt 165 lb (74.844 kg)  BMI 26.64 kg/m2  SpO2 99% Physical Exam  Constitutional: She appears well-developed and well-nourished. No distress.  HENT:  Head:  Normocephalic.  Eyes: Conjunctivae are normal.  Neck: Neck supple.  Cardiovascular: Normal rate, regular rhythm and normal heart sounds.   Pulmonary/Chest: Effort normal and breath sounds normal. No respiratory distress. She has no wheezes. She has no rales.  Musculoskeletal: She exhibits no edema.  Swelling noted to the fourth and fifth MTP joints and fifth toe. Tender to palpation over these joints and over fourth and fifth toes. Unable to move toes at fourth and fifth MTP joints. Normal cap refill distally in all toes. Normal sensation. Normal foot otherwise.  Neurological: She is alert.  Skin: Skin is warm and dry.  Psychiatric: She has a  normal mood and affect. Her behavior is normal.  Nursing note and vitals reviewed.   ED Course  Procedures (including critical care time) DIAGNOSTIC STUDIES: Oxygen Saturation is 99% on RA, normal by my interpretation.    COORDINATION OF CARE: 11:15 PM- Pt advised of plan for treatment and pt agrees.    Labs Review Labs Reviewed - No data to display  Imaging Review Dg Foot Complete Right  11/26/2013   CLINICAL DATA:  Lateral aspect of foot pain. Hit foot against stair rail  EXAM: RIGHT FOOT COMPLETE - 3+ VIEW  COMPARISON:  None.  FINDINGS: There is an acute fracture involving the mid shaft of the fifth proximal phalanx. Mild lateral angulation of the distal fracture fragments identified. Small plantar heel spur is identified.  IMPRESSION: 1. Fifth proximal phalanx fracture.   Electronically Signed   By: Signa Kellaylor  Stroud M.D.   On: 11/26/2013 00:08     EKG Interpretation None      MDM   Final diagnoses:  Foot injury  Fracture of fifth toe, right, closed, initial encounter    X-ray showing right fifth proximal phalanx fracture. Toes buddy taped. Postop shoe provided. Crutches provided. Home with pain medications and follow-up with orthopedic specialist.  Filed Vitals:   11/25/13 2251  BP: 118/79  Pulse: 74  Temp: 98 F (36.7 C)  TempSrc: Oral  Resp: 14  Height: 5\' 6"  (1.676 m)  Weight: 165 lb (74.844 kg)  SpO2: 99%     I personally performed the services described in this documentation, which was scribed in my presence. The recorded information has been reviewed and is accurate.    Lottie Musselatyana A Bryant Lipps, PA-C 11/26/13 0018  Olivia Mackielga M Otter, MD 11/26/13 [id number]

## 2013-11-26 MED ORDER — IBUPROFEN 600 MG PO TABS: 600.0000 mg | ORAL_TABLET | Freq: Four times a day (QID) | ORAL | Status: DC | PRN

## 2013-11-26 MED ORDER — HYDROCODONE-ACETAMINOPHEN 5-325 MG PO TABS: 1.0000 | ORAL_TABLET | ORAL | Status: DC | PRN

## 2013-11-26 NOTE — Discharge Instructions (Signed)
Ibuprofen for pain. norco for severe pain. Keep post op shoe on, keep toes buddy taped. Ice. Elevate. Crutches for 1-2 wks. Follow up with orthopedics specialist.   Toe Fracture Your caregiver has diagnosed you as having a fractured toe. A toe fracture is a break in the bone of a toe. "Buddy taping" is a way of splinting your broken toe, by taping the broken toe to the toe next to it. This "buddy taping" will keep the injured toe from moving beyond normal range of motion. Buddy taping also helps the toe heal in a more normal alignment. It may take 6 to 8 weeks for the toe injury to heal. HOME CARE INSTRUCTIONS   Leave your toes taped together for as long as directed by your caregiver or until you see a doctor for a follow-up examination. You can change the tape after bathing. Always use a small piece of gauze or cotton between the toes when taping them together. This will help the skin stay dry and prevent infection.  Apply ice to the injury for 15-20 minutes each hour while awake for the first 2 days. Put the ice in a plastic bag and place a towel between the bag of ice and your skin.  After the first 2 days, apply heat to the injured area. Use heat for the next 2 to 3 days. Place a heating pad on the foot or soak the foot in warm water as directed by your caregiver.  Keep your foot elevated as much as possible to lessen swelling.  Wear sturdy, supportive shoes. The shoes should not pinch the toes or fit tightly against the toes.  Your caregiver may prescribe a rigid shoe if your foot is very swollen.  Your may be given crutches if the pain is too great and it hurts too much to walk.  Only take over-the-counter or prescription medicines for pain, discomfort, or fever as directed by your caregiver.  If your caregiver has given you a follow-up appointment, it is very important to keep that appointment. Not keeping the appointment could result in a chronic or permanent injury, pain, and  disability. If there is any problem keeping the appointment, you must call back to this facility for assistance. SEEK MEDICAL CARE IF:   You have increased pain or swelling, not relieved with medications.  The pain does not get better after 1 week.  Your injured toe is cold when the others are warm. SEEK IMMEDIATE MEDICAL CARE IF:   The toe becomes cold, numb, or white.  The toe becomes hot (inflamed) and red. Document Released: 12/17/1999 Document Revised: 03/13/2011 Document Reviewed: 08/05/2007 [facility] Patient Information 2015 [place]. This information is not intended to replace advice given to you by your health care provider. Make sure you discuss any questions you have with your health care provider.

## 2014-02-17 ENCOUNTER — Emergency Department (HOSPITAL_COMMUNITY)
Admission: EM | Admit: 2014-02-17 | Discharge: 2014-02-17 | Disposition: A | Discharge status: Home / Self Care | Payer: No Typology Code available for payment source | Attending: Emergency Medicine | Admitting: Emergency Medicine

## 2014-02-17 ENCOUNTER — Encounter (HOSPITAL_COMMUNITY): Payer: Self-pay

## 2014-02-17 DIAGNOSIS — M6740 Ganglion, unspecified site: Secondary | ICD-10-CM

## 2014-02-17 DIAGNOSIS — M67432 Ganglion, left wrist: Secondary | ICD-10-CM | POA: Diagnosis not present

## 2014-02-17 DIAGNOSIS — Z7951 Long term (current) use of inhaled steroids: Secondary | ICD-10-CM | POA: Diagnosis not present

## 2014-02-17 DIAGNOSIS — Z7952 Long term (current) use of systemic steroids: Secondary | ICD-10-CM | POA: Insufficient documentation

## 2014-02-17 DIAGNOSIS — Z79899 Other long term (current) drug therapy: Secondary | ICD-10-CM | POA: Diagnosis not present

## 2014-02-17 DIAGNOSIS — Z720 Tobacco use: Secondary | ICD-10-CM | POA: Insufficient documentation

## 2014-02-17 DIAGNOSIS — I2510 Atherosclerotic heart disease of native coronary artery without angina pectoris: Secondary | ICD-10-CM | POA: Insufficient documentation

## 2014-02-17 DIAGNOSIS — I10 Essential (primary) hypertension: Secondary | ICD-10-CM | POA: Diagnosis not present

## 2014-02-17 DIAGNOSIS — M25532 Pain in left wrist: Secondary | ICD-10-CM | POA: Diagnosis present

## 2014-02-17 NOTE — ED Notes (Signed)
Pt complains of a knot on her left wrist that has been getting bigger in the last week, no injury  noted

## 2014-02-17 NOTE — ED Provider Notes (Signed)
CSN: [id number]     Arrival date & time 02/17/14  2037 History  This chart was scribed for non-physician practitioner, Arman FilterGail K Kailan Laws, NP, working with Gilda Creasehristopher J. Pollina, *, by Modena JanskyAlbert Thayil, ED Scribe. This patient was seen in room WTR5/WTR5 and the patient's care was started at 10:35 PM.   Chief Complaint  Patient presents with  . Wrist Pain   The history is provided by the patient. No language interpreter was used.   HPI Comments: Tracy Mercado is a 54 y.o. female who presents to the Emergency Department complaining of constant moderate left wrist pain that started 2 week ago. She reports that she noticed a knot on her left wrist that keep getting bigger. She denies any known injury. She reports no modifying factors for the pain.   Past Medical History  Diagnosis Date  . Hypertension   . CAD (coronary artery disease)    Past Surgical History  Procedure Laterality Date  . Knee arthroscopy     History reviewed. No pertinent family history. History  Substance Use Topics  . Smoking status: Current Every Day Smoker -- 0.50 packs/day  . Smokeless tobacco: Not on file  . Alcohol Use: Yes   OB History    No data available     Review of Systems  Musculoskeletal: Positive for arthralgias. Negative for myalgias and joint swelling.    Allergies  Review of patient's allergies indicates no known allergies.  Home Medications   Prior to Admission medications   Medication Sig Start Date End Date Taking? Authorizing Provider  Acetaminophen (TYLENOL PO) Take 2 tablets by mouth every 6 (six) hours as needed. For pain    Historical Provider, MD  ALPRAZolam (XANAX) 0.25 MG tablet Take 1 tablet (0.25 mg total) by mouth 3 (three) times daily as needed for anxiety. 06/02/12   Richardean Canalavid H Yao, MD  azithromycin (ZITHROMAX) 250 MG tablet 5 day dose pack.  Take as directed. 07/16/12   Zonia KiefJames Owens, PA-C  chlorpheniramine-HYDROcodone (TUSSIONEX PENNKINETIC ER) 10-8 MG/5ML LQCR Take 5 mLs by mouth  every 12 (twelve) hours as needed (cough.  do not drive or consume alcoholic beverages when using this medication.). 07/16/12   Zonia KiefJames Owens, PA-C  fexofenadine-pseudoephedrine (ALLEGRA-D) 60-120 MG per tablet Take 1 tablet by mouth every 12 (twelve) hours. 07/16/12   Zonia KiefJames Owens, PA-C  fluticasone (FLONASE) 50 MCG/ACT nasal spray Place 2 sprays into the nose daily. 07/16/12   Zonia KiefJames Owens, PA-C  hydrochlorothiazide (HYDRODIURIL) 25 MG tablet Take 1 tablet (25 mg total) by mouth daily. 06/02/12   Richardean Canalavid H Yao, MD  HYDROcodone-acetaminophen (NORCO/VICODIN) 5-325 MG per tablet Take 1 tablet by mouth every 4 (four) hours as needed for moderate pain or severe pain. 11/26/13   Tatyana A Kirichenko, PA-C  ibuprofen (ADVIL,MOTRIN) 600 MG tablet Take 1 tablet (600 mg total) by mouth every 6 (six) hours as needed. 11/26/13   Tatyana A Kirichenko, PA-C  lisinopril (PRINIVIL,ZESTRIL) 20 MG tablet Take 1 tablet (20 mg total) by mouth daily. 06/02/12   Richardean Canalavid H Yao, MD  predniSONE (DELTASONE) 20 MG tablet Take 2 tablets (40 mg total) by mouth daily. 07/25/13   Kaitlyn Szekalski, PA-C   BP 110/61 mmHg  Pulse 74  Temp(Src) 98.2 F (36.8 C) (Oral)  Resp 16  SpO2 97% Physical Exam  Constitutional: She appears well-developed and well-nourished.  HENT:  Head: Normocephalic.  Eyes: Pupils are equal, round, and reactive to light.  Cardiovascular: Normal rate.   Musculoskeletal: She exhibits tenderness. She  exhibits no edema.       Left wrist: She exhibits normal range of motion and no tenderness.  Small pea size ganglion cyst medial aspect left breast  Skin: Skin is warm.  Nursing note and vitals reviewed.   ED Course  Procedures (including critical care time) DIAGNOSTIC STUDIES: Oxygen Saturation is 97% on RA, normal by my interpretation.    COORDINATION OF CARE: 10:39 PM- Pt advised of plan for treatment and pt agrees.  Labs Review Labs Reviewed - No data to display  Imaging Review No results found.   EKG  Interpretation None      MDM   Final diagnoses:  Ganglion cyst    I personally performed the services described in this documentation, which was scribed in my presence. The recorded information has been reviewed and is accurate.    Arman Filter, NP 02/17/14 2241  Gilda Crease, MD 02/17/14 2328

## 2014-02-17 NOTE — Discharge Instructions (Signed)

## 2014-12-08 ENCOUNTER — Emergency Department (HOSPITAL_COMMUNITY)
Admission: EM | Admit: 2014-12-08 | Discharge: 2014-12-08 | Disposition: A | Discharge status: Home / Self Care | Payer: No Typology Code available for payment source | Attending: Emergency Medicine | Admitting: Emergency Medicine

## 2014-12-08 ENCOUNTER — Encounter (HOSPITAL_COMMUNITY): Payer: Self-pay

## 2014-12-08 DIAGNOSIS — I10 Essential (primary) hypertension: Secondary | ICD-10-CM | POA: Diagnosis not present

## 2014-12-08 DIAGNOSIS — N390 Urinary tract infection, site not specified: Secondary | ICD-10-CM | POA: Diagnosis not present

## 2014-12-08 DIAGNOSIS — F17200 Nicotine dependence, unspecified, uncomplicated: Secondary | ICD-10-CM | POA: Diagnosis not present

## 2014-12-08 DIAGNOSIS — I2510 Atherosclerotic heart disease of native coronary artery without angina pectoris: Secondary | ICD-10-CM | POA: Diagnosis not present

## 2014-12-08 DIAGNOSIS — R509 Fever, unspecified: Secondary | ICD-10-CM | POA: Diagnosis present

## 2014-12-08 LAB — CBC WITH DIFFERENTIAL/PLATELET
Basophils Absolute: 0 10*3/uL (ref 0.0–0.1)
Basophils Relative: 0 %
Eosinophils Absolute: 0 10*3/uL (ref 0.0–0.7)
Eosinophils Relative: 0 %
HEMATOCRIT: 40.3 % (ref 36.0–46.0)
HEMOGLOBIN: 13.8 g/dL (ref 12.0–15.0)
LYMPHS ABS: 1.3 10*3/uL (ref 0.7–4.0)
Lymphocytes Relative: 13 %
MCH: 34.3 pg — AB (ref 26.0–34.0)
MCHC: 34.2 g/dL (ref 30.0–36.0)
MCV: 100.2 fL — ABNORMAL HIGH (ref 78.0–100.0)
MONO ABS: 2.9 10*3/uL — AB (ref 0.1–1.0)
MONOS PCT: 28 %
NEUTROS ABS: 6 10*3/uL (ref 1.7–7.7)
NEUTROS PCT: 59 %
Platelets: 127 10*3/uL — ABNORMAL LOW (ref 150–400)
RBC: 4.02 MIL/uL (ref 3.87–5.11)
RDW: 12.7 % (ref 11.5–15.5)
WBC: 10.1 10*3/uL (ref 4.0–10.5)

## 2014-12-08 LAB — URINALYSIS, ROUTINE W REFLEX MICROSCOPIC
Bilirubin Urine: NEGATIVE
GLUCOSE, UA: NEGATIVE mg/dL
Ketones, ur: NEGATIVE mg/dL
Nitrite: NEGATIVE
PH: 6 (ref 5.0–8.0)
PROTEIN: NEGATIVE mg/dL
SPECIFIC GRAVITY, URINE: 1.01 (ref 1.005–1.030)

## 2014-12-08 LAB — COMPREHENSIVE METABOLIC PANEL
ALT: 18 U/L (ref 14–54)
AST: 16 U/L (ref 15–41)
Albumin: 3.4 g/dL — ABNORMAL LOW (ref 3.5–5.0)
Alkaline Phosphatase: 65 U/L (ref 38–126)
Anion gap: 7 (ref 5–15)
BUN: 8 mg/dL (ref 6–20)
CHLORIDE: 100 mmol/L — AB (ref 101–111)
CO2: 27 mmol/L (ref 22–32)
CREATININE: 0.67 mg/dL (ref 0.44–1.00)
Calcium: 10.1 mg/dL (ref 8.9–10.3)
GFR calc Af Amer: >60 mL/min (ref >60)
GFR calc non Af Amer: >60 mL/min (ref >60)
Glucose, Bld: 121 mg/dL — ABNORMAL HIGH (ref 65–99)
POTASSIUM: 3.3 mmol/L — AB (ref 3.5–5.1)
SODIUM: 134 mmol/L — AB (ref 135–145)
Total Bilirubin: 1.2 mg/dL (ref 0.3–1.2)
Total Protein: 7.5 g/dL (ref 6.5–8.1)

## 2014-12-08 LAB — URINE MICROSCOPIC-ADD ON

## 2014-12-08 MED ORDER — CIPROFLOXACIN HCL 500 MG PO TABS: 500.0000 mg | ORAL_TABLET | Freq: Two times a day (BID) | ORAL | Status: DC

## 2014-12-08 MED ORDER — ONDANSETRON HCL 4 MG/2ML IJ SOLN
4.0000 mg | Freq: Once | INTRAMUSCULAR | Status: AC
  Administered 2014-12-08: 4 mg via INTRAVENOUS
  Filled 2014-12-08: qty 2

## 2014-12-08 MED ORDER — SODIUM CHLORIDE 0.9 % IV BOLUS (SEPSIS)
1000.0000 mL | Freq: Once | INTRAVENOUS | Status: AC
  Administered 2014-12-08: 1000 mL via INTRAVENOUS

## 2014-12-08 MED ORDER — NAPROXEN 500 MG PO TABS
500.0000 mg | ORAL_TABLET | Freq: Once | ORAL | Status: AC
  Administered 2014-12-08: 500 mg via ORAL
  Filled 2014-12-08: qty 1

## 2014-12-08 MED ORDER — PROMETHAZINE HCL 25 MG PO TABS: 25.0000 mg | ORAL_TABLET | Freq: Four times a day (QID) | ORAL | Status: DC | PRN

## 2014-12-08 MED ORDER — DEXTROSE 5 % IV SOLN
1.0000 g | Freq: Once | INTRAVENOUS | Status: AC
  Administered 2014-12-08: 1 g via INTRAVENOUS
  Filled 2014-12-08: qty 10

## 2014-12-08 MED ORDER — NAPROXEN 500 MG PO TABS: 500.0000 mg | ORAL_TABLET | Freq: Two times a day (BID) | ORAL | Status: DC

## 2014-12-08 NOTE — ED Notes (Signed)
Advised patient of need for urine.

## 2014-12-08 NOTE — ED Provider Notes (Signed)
CSN: [id number]     Arrival date & time 12/08/14  [id number] History   First MD Initiated Contact with Patient 12/08/14 [phone]     Chief Complaint  Patient presents with  . Generalized Body Aches     HPI Pt has had headache, neck and back ache since Saturday.   She feels like her body has been aching all over.  She started to have chills on Saturday.   No cough, no sore throat.  No chest or abdominal pain.  Urinary frequency but she has been drinking a lot of fluids.  She has had nausea and vomiting without diarrhea. She has tried over the counter medications and heating pads without releif. Past Medical History  Diagnosis Date  . Hypertension   . CAD (coronary artery disease)    Past Surgical History  Procedure Laterality Date  . Knee arthroscopy     No family history on file. Social History  Substance Use Topics  . Smoking status: Current Every Day Smoker -- 0.50 packs/day  . Smokeless tobacco: None  . Alcohol Use: Yes     Comment: socially   OB History    No data available     Review of Systems  Constitutional: Positive for fever.  Gastrointestinal: Positive for nausea.  All other systems reviewed and are negative.     Allergies  Review of patient's allergies indicates no known allergies.  Home Medications   Prior to Admission medications   Medication Sig Start Date End Date Taking? Authorizing Provider  ibuprofen (ADVIL,MOTRIN) 200 MG tablet Take 400-800 mg by mouth every 6 (six) hours as needed for fever, headache or moderate pain.    Yes Historical Provider, MD  ALPRAZolam (XANAX) 0.25 MG tablet Take 1 tablet (0.25 mg total) by mouth 3 (three) times daily as needed for anxiety. Patient not taking: Reported on 02/17/2014 06/02/12   Richardean Canalavid H Yao, MD  ciprofloxacin (CIPRO) 500 MG tablet Take 1 tablet (500 mg total) by mouth every 12 (twelve) hours. 12/08/14   Linwood DibblesJon Sondos Wolfman, MD  hydrochlorothiazide (HYDRODIURIL) 25 MG tablet Take 1 tablet (25 mg total) by mouth daily. Patient not  taking: Reported on 02/17/2014 06/02/12   Richardean Canalavid H Yao, MD  lisinopril (PRINIVIL,ZESTRIL) 20 MG tablet Take 1 tablet (20 mg total) by mouth daily. Patient not taking: Reported on 02/17/2014 06/02/12   Richardean Canalavid H Yao, MD  naproxen (NAPROSYN) 500 MG tablet Take 1 tablet (500 mg total) by mouth 2 (two) times daily. 12/08/14   Linwood DibblesJon Chantrell Apsey, MD   BP 125/84 mmHg  Pulse 78  Temp(Src) 98.8 F (37.1 C) (Oral)  Resp 16  SpO2 97% Physical Exam  Constitutional: She appears well-developed and well-nourished. No distress.  HENT:  Head: Normocephalic and atraumatic.  Right Ear: External ear normal.  Left Ear: External ear normal.  Eyes: Conjunctivae are normal. Right eye exhibits no discharge. Left eye exhibits no discharge. No scleral icterus.  Neck: Neck supple. No tracheal deviation present.  Cardiovascular: Normal rate, regular rhythm and intact distal pulses.   Pulmonary/Chest: Effort normal and breath sounds normal. No stridor. No respiratory distress. She has no wheezes. She has no rales.  Abdominal: Soft. Bowel sounds are normal. She exhibits no distension. There is no tenderness. There is no rebound and no guarding.  Musculoskeletal: She exhibits no edema or tenderness.  Neurological: She is alert. She has normal strength. No cranial nerve deficit (no facial droop, extraocular movements intact, no slurred speech) or sensory deficit. She exhibits normal muscle tone.  She displays no seizure activity. Coordination normal.  Skin: Skin is warm and dry. No rash noted.  Psychiatric: She has a normal mood and affect.  Nursing note and vitals reviewed.   ED Course  Procedures (including critical care time) Labs Review Labs Reviewed  CBC WITH DIFFERENTIAL/PLATELET - Abnormal; Notable for the following:    MCV 100.2 (*)    MCH 34.3 (*)    Platelets 127 (*)    Monocytes Absolute 2.9 (*)    All other components within normal limits  URINALYSIS, ROUTINE W REFLEX MICROSCOPIC (NOT AT [facility]) - Abnormal; Notable for  the following:    Color, Urine AMBER (*)    APPearance CLOUDY (*)    Hgb urine dipstick TRACE (*)    Leukocytes, UA LARGE (*)    All other components within normal limits  COMPREHENSIVE METABOLIC PANEL - Abnormal; Notable for the following:    Sodium 134 (*)    Potassium 3.3 (*)    Chloride 100 (*)    Glucose, Bld 121 (*)    Albumin 3.4 (*)    All other components within normal limits  URINE MICROSCOPIC-ADD ON - Abnormal; Notable for the following:    Squamous Epithelial / LPF 0-5 (*)    Bacteria, UA MANY (*)    All other components within normal limits  URINE CULTURE    Medications  cefTRIAXone (ROCEPHIN) 1 g in dextrose 5 % 50 mL IVPB (not administered)  sodium chloride 0.9 % bolus 1,000 mL (0 mLs Intravenous Stopped 12/08/14 1012)  ondansetron (ZOFRAN) injection 4 mg (4 mg Intravenous Given 12/08/14 0912)  naproxen (NAPROSYN) tablet 500 mg (500 mg Oral Given 12/08/14 0902)     MDM   Final diagnoses:  UTI (lower urinary tract infection)    Patient presents to the emergency room with complaints of back pain, myalgias and some urinary symptoms. She has had nausea and vomiting.  She is afebrile here and nontoxic. Toward test to suggest a urinary tract infection. I suspect her symptoms are related to early pyelonephritis.  Patient was given a dose of Rocephin in the emergency room. Plan on discharge home with prescriptions for Cipro.  Outpatient follow-up in one week.    Linwood Dibbles, MD 12/08/14 [phone]

## 2014-12-08 NOTE — Discharge Instructions (Signed)

## 2014-12-08 NOTE — ED Notes (Addendum)
Patient c/o generalized body aches and HA since Saturday morning.  Unable to work since this date.  Patient c/o N/V and unable to keep solids/fluids down.  Patient advised fever on Saturday of 102. Denies fevers since Sunday morning.  Breathing even and unlabored, NAD at this time.

## 2014-12-10 LAB — URINE CULTURE: Culture: >=100000

## 2014-12-11 ENCOUNTER — Telehealth (HOSPITAL_COMMUNITY): Payer: Self-pay

## 2014-12-11 NOTE — Telephone Encounter (Signed)
Post ED Visit - Positive Culture Follow-up  Culture report reviewed by antimicrobial stewardship pharmacist:  []  Enzo BiNathan Batchelder, Pharm.D. []  Celedonio MiyamotoJeremy Frens, [address]Ph[address]D., BCPS [x]  Garvin FilaMike Maccia, Pharm.D. []  Georgina PillionElizabeth Martin, Pharm.D., BCPS []  ElroyMinh Pham, [place]arm[place]D., BCPS, AAHIVP []  Estella HuskMichelle Turner, Pharm.D., BCPS, AAHIVP []  Tennis Mustassie Stewart, Pharm.D. []  Sherle Poeob Vincent, [address]Pharm[address]  Positive urine culture Treated with cipro, organism sensitive to the same and no further patient follow-up is required at this time.  Ashley JacobsFesterman, [name] C 12/11/2014, 11:14 AM

## 2016-07-08 ENCOUNTER — Encounter (HOSPITAL_COMMUNITY): Payer: Self-pay | Admitting: Emergency Medicine

## 2016-07-08 ENCOUNTER — Emergency Department (HOSPITAL_COMMUNITY)
Admission: EM | Admit: 2016-07-08 | Discharge: 2016-07-08 | Disposition: A | Discharge status: Home / Self Care | Payer: No Typology Code available for payment source | Attending: Emergency Medicine | Admitting: Emergency Medicine

## 2016-07-08 DIAGNOSIS — I2510 Atherosclerotic heart disease of native coronary artery without angina pectoris: Secondary | ICD-10-CM | POA: Insufficient documentation

## 2016-07-08 DIAGNOSIS — Y998 Other external cause status: Secondary | ICD-10-CM | POA: Insufficient documentation

## 2016-07-08 DIAGNOSIS — H5789 Other specified disorders of eye and adnexa: Secondary | ICD-10-CM

## 2016-07-08 DIAGNOSIS — S0083XA Contusion of other part of head, initial encounter: Secondary | ICD-10-CM

## 2016-07-08 DIAGNOSIS — Y929 Unspecified place or not applicable: Secondary | ICD-10-CM | POA: Insufficient documentation

## 2016-07-08 DIAGNOSIS — F17200 Nicotine dependence, unspecified, uncomplicated: Secondary | ICD-10-CM | POA: Insufficient documentation

## 2016-07-08 DIAGNOSIS — W1781XA Fall down embankment (hill), initial encounter: Secondary | ICD-10-CM | POA: Insufficient documentation

## 2016-07-08 DIAGNOSIS — S0081XA Abrasion of other part of head, initial encounter: Secondary | ICD-10-CM

## 2016-07-08 DIAGNOSIS — Y9389 Activity, other specified: Secondary | ICD-10-CM | POA: Insufficient documentation

## 2016-07-08 DIAGNOSIS — Z23 Encounter for immunization: Secondary | ICD-10-CM | POA: Insufficient documentation

## 2016-07-08 DIAGNOSIS — H578 Other specified disorders of eye and adnexa: Secondary | ICD-10-CM | POA: Insufficient documentation

## 2016-07-08 DIAGNOSIS — I10 Essential (primary) hypertension: Secondary | ICD-10-CM | POA: Insufficient documentation

## 2016-07-08 MED ORDER — TETANUS-DIPHTH-ACELL PERTUSSIS 5-2.5-18.5 LF-MCG/0.5 IM SUSP
0.5000 mL | Freq: Once | INTRAMUSCULAR | Status: AC
  Administered 2016-07-08: 0.5 mL via INTRAMUSCULAR
  Filled 2016-07-08: qty 0.5

## 2016-07-08 MED ORDER — FLUORESCEIN SODIUM 0.6 MG OP STRP
1.0000 | ORAL_STRIP | Freq: Once | OPHTHALMIC | Status: AC
  Administered 2016-07-08: 1 via OPHTHALMIC
  Filled 2016-07-08: qty 1

## 2016-07-08 MED ORDER — TETRACAINE HCL 0.5 % OP SOLN
1.0000 [drp] | Freq: Once | OPHTHALMIC | Status: DC
  Filled 2016-07-08: qty 4

## 2016-07-08 MED ORDER — ERYTHROMYCIN 5 MG/GM OP OINT
1.0000 "application " | TOPICAL_OINTMENT | Freq: Once | OPHTHALMIC | Status: AC
  Administered 2016-07-08: 1 via OPHTHALMIC
  Filled 2016-07-08: qty 3.5

## 2016-07-08 NOTE — ED Notes (Signed)
Pt visibly agitated stating that she is unable to see out of one eye and we are "working her nerves".
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
## 2016-07-08 NOTE — Discharge Instructions (Signed)
Use the eye ointment 3 times a day and follow up with the eye doctor on Monday. Return here if symptoms worsen.

## 2016-12-04 ENCOUNTER — Encounter: Payer: Self-pay | Admitting: Family Medicine

## 2016-12-04 ENCOUNTER — Ambulatory Visit: Payer: Self-pay | Attending: Family Medicine | Admitting: Family Medicine

## 2016-12-04 VITALS — BP 133/80 | HR 72 | Temp 97.9°F | Resp 18 | Ht 67.0 in | Wt 168.0 lb

## 2016-12-04 DIAGNOSIS — Z0000 Encounter for general adult medical examination without abnormal findings: Secondary | ICD-10-CM

## 2016-12-04 DIAGNOSIS — M25511 Pain in right shoulder: Secondary | ICD-10-CM | POA: Insufficient documentation

## 2016-12-04 DIAGNOSIS — M5412 Radiculopathy, cervical region: Secondary | ICD-10-CM

## 2016-12-04 DIAGNOSIS — M79602 Pain in left arm: Secondary | ICD-10-CM

## 2016-12-04 DIAGNOSIS — M25512 Pain in left shoulder: Secondary | ICD-10-CM | POA: Insufficient documentation

## 2016-12-04 DIAGNOSIS — Z1239 Encounter for other screening for malignant neoplasm of breast: Secondary | ICD-10-CM

## 2016-12-04 DIAGNOSIS — R202 Paresthesia of skin: Secondary | ICD-10-CM

## 2016-12-04 DIAGNOSIS — Z23 Encounter for immunization: Secondary | ICD-10-CM | POA: Insufficient documentation

## 2016-12-04 DIAGNOSIS — Z79899 Other long term (current) drug therapy: Secondary | ICD-10-CM | POA: Insufficient documentation

## 2016-12-04 DIAGNOSIS — Z1231 Encounter for screening mammogram for malignant neoplasm of breast: Secondary | ICD-10-CM

## 2016-12-04 DIAGNOSIS — Z1211 Encounter for screening for malignant neoplasm of colon: Secondary | ICD-10-CM

## 2016-12-04 DIAGNOSIS — M542 Cervicalgia: Secondary | ICD-10-CM | POA: Insufficient documentation

## 2016-12-04 DIAGNOSIS — M5410 Radiculopathy, site unspecified: Secondary | ICD-10-CM | POA: Insufficient documentation

## 2016-12-04 DIAGNOSIS — M79601 Pain in right arm: Secondary | ICD-10-CM

## 2016-12-04 MED ORDER — METHOCARBAMOL 500 MG PO TABS: 500.0000 mg | ORAL_TABLET | Freq: Three times a day (TID) | ORAL | 0 refills | Status: DC | PRN

## 2016-12-04 MED ORDER — WRIST BRACE MISC: 0 refills | Status: DC

## 2016-12-04 MED ORDER — NAPROXEN 500 MG PO TABS: 500.0000 mg | ORAL_TABLET | Freq: Two times a day (BID) | ORAL | 0 refills | Status: DC

## 2016-12-04 NOTE — Progress Notes (Signed)
Patient is here for establish care   Patient complains about both shoulder to hand pain it feels tingling,  numb finger tips feels like a burning sensation   Patient stated that she been with out  BP med over a year

## 2016-12-04 NOTE — Patient Instructions (Signed)

## 2016-12-04 NOTE — Progress Notes (Signed)
Subjective:  Patient ID: Tracy Mercado, female    DOB: 04/20/1960  Age: 56 y.o. MRN: [id number]  CC: Establish Care   HPI Tracy ReeveMarilyn Y Tracy Mercado presents for complains of neck pain and bilateral shoulder pain.  Onset 2 weeks ago.  Associated symptoms include paresthesias extending from the neck down to bilateral arms.  She describes pain as tingling and burning-like.  Symptoms worsen with raising arms or turning neck.  She denies any history of neck injury or back injury she reports taking over-the-counter Advil with no relief of symptoms.  She does report history of heavy lifting at work.        Outpatient Medications Prior to Visit  Medication Sig Dispense Refill  . ibuprofen (ADVIL,MOTRIN) 200 MG tablet Take 400-800 mg by mouth every 6 (six) hours as needed for fever, headache or moderate pain.     Marland Kitchen. lisinopril (PRINIVIL,ZESTRIL) 20 MG tablet Take 1 tablet (20 mg total) by mouth daily. (Patient not taking: Reported on 02/17/2014) 30 tablet 0  . ALPRAZolam (XANAX) 0.25 MG tablet Take 1 tablet (0.25 mg total) by mouth 3 (three) times daily as needed for anxiety. (Patient not taking: Reported on 02/17/2014) 15 tablet 0  . ciprofloxacin (CIPRO) 500 MG tablet Take 1 tablet (500 mg total) by mouth every 12 (twelve) hours. (Patient not taking: Reported on 12/04/2016) 20 tablet 0  . hydrochlorothiazide (HYDRODIURIL) 25 MG tablet Take 1 tablet (25 mg total) by mouth daily. (Patient not taking: Reported on 02/17/2014) 30 tablet 0  . naproxen (NAPROSYN) 500 MG tablet Take 1 tablet (500 mg total) by mouth 2 (two) times daily. (Patient not taking: Reported on 12/04/2016) 30 tablet 0  . promethazine (PHENERGAN) 25 MG tablet Take 1 tablet (25 mg total) by mouth every 6 (six) hours as needed for nausea or vomiting. (Patient not taking: Reported on 12/04/2016) 12 tablet 0   No facility-administered medications prior to visit.     ROS Review of Systems  Constitutional: Negative.   Respiratory: Negative.    Cardiovascular: Negative.   Musculoskeletal: Positive for arthralgias, myalgias and neck pain.  Skin: Negative.   Neurological: Positive for numbness.       Paresthesias        Objective:  BP 133/80 (BP Location: Left Arm, Patient Position: Sitting, Cuff Size: Normal)   Pulse 72   Temp 97.9 F (36.6 C) (Oral)   Resp 18   Ht 5\' 7"  (1.702 m)   Wt 168 lb (76.2 kg)   SpO2 94%   BMI 26.31 kg/m   BP/Weight 12/04/2016 07/08/2016 12/08/2014  Systolic BP 133 129 132  Diastolic BP 80 70 89  Wt. (Lbs) 168 160 -  BMI 26.31 26.63 -     Physical Exam  Constitutional: She appears well-developed and well-nourished.  Eyes: Conjunctivae are normal. Pupils are equal, round, and reactive to light.  Cardiovascular: Normal rate, regular rhythm, normal heart sounds and intact distal pulses.  Pulmonary/Chest: Effort normal and breath sounds normal.  Abdominal: Soft. Bowel sounds are normal.  Musculoskeletal:       Right shoulder: She exhibits pain.       Left shoulder: She exhibits pain.       Cervical back: She exhibits pain (Pain with range of motion).  Positive Tinel's and Phalen sign to bilateral wrist.  Skin: Skin is warm and dry.  Nursing note and vitals reviewed.     Depression screen [facility] 2/9 12/04/2016  Decreased Interest 0  Down, Depressed, Hopeless 0  PHQ - 2 Score 0  Altered sleeping 3  Tired, decreased energy 3  Change in appetite 3  Feeling bad or failure about yourself  3  Trouble concentrating 0  Moving slowly or fidgety/restless 0  Suicidal thoughts 0  PHQ-9 Score 12    GAD 7 : Generalized Anxiety Score 12/04/2016  Nervous, Anxious, on Edge 1  Control/stop worrying 0  Worry too much - different things 3  Trouble relaxing 3  Restless 3  Easily annoyed or irritable 3  Afraid - awful might happen 0  Total GAD 7 Score 13       Assessment & Plan:   1. Cervical radicular pain Cervical spine issue vs carpal tunnel. Patient encouraged to apply for orange  card and discount program, if referral to specialist as needed. - DG Cervical Spine Complete; Future - naproxen (NAPROSYN) 500 MG tablet; Take 1 tablet (500 mg total) by mouth 2 (two) times daily with a meal.  Dispense: 60 tablet; Refill: 0 - methocarbamol (ROBAXIN) 500 MG tablet; Take 1 tablet (500 mg total) by mouth every 8 (eight) hours as needed for muscle spasms.  Dispense: 40 tablet; Refill: 0 - Misc. Devices (WRIST BRACE) MISC; APPLY LEFT AND RIGHT WRIST BRACE TO BILATERAL WRIST/FOREARM. FOR STABILITY AND SUPPORT. TO BE FITTED BY MEDICAL SUPPLY.  Dispense: 2 each; Refill: 0  2. Paresthesia and pain of both upper extremities  - DG Cervical Spine Complete; Future - naproxen (NAPROSYN) 500 MG tablet; Take 1 tablet (500 mg total) by mouth 2 (two) times daily with a meal.  Dispense: 60 tablet; Refill: 0 - methocarbamol (ROBAXIN) 500 MG tablet; Take 1 tablet (500 mg total) by mouth every 8 (eight) hours as needed for muscle spasms.  Dispense: 40 tablet; Refill: 0 - Misc. Devices (WRIST BRACE) MISC; APPLY LEFT AND RIGHT WRIST BRACE TO BILATERAL WRIST/FOREARM. FOR STABILITY AND SUPPORT. TO BE FITTED BY MEDICAL SUPPLY.  Dispense: 2 each; Refill: 0  3. Screening for colon cancer  - Fecal occult blood, imunochemical; Future   4. Screening for breast cancer  - MM SCREENING BREAST TOMO BILATERAL; Future  5. Needs flu shot  - Flu Vaccine QUAD 6+ mos PF IM (Fluarix Quad PF)  6. Healthcare maintenance  - Hepatitis C Antibody - HIV antibody (with reflex)    Follow-up: Return in about 6 weeks (around 01/15/2017), or if symptoms worsen or fail to improve, for Neck pain .   Lizbeth BarkMandesia R Ciel Yanes FNP

## 2016-12-05 LAB — HEPATITIS C ANTIBODY

## 2016-12-05 LAB — HIV ANTIBODY (ROUTINE TESTING W REFLEX): HIV Screen 4th Generation wRfx: NONREACTIVE

## 2016-12-06 ENCOUNTER — Other Ambulatory Visit: Payer: Self-pay | Admitting: Family Medicine

## 2016-12-06 DIAGNOSIS — R768 Other specified abnormal immunological findings in serum: Secondary | ICD-10-CM

## 2016-12-08 ENCOUNTER — Telehealth: Payer: Self-pay

## 2016-12-08 NOTE — Telephone Encounter (Signed)
CMA call regarding lab results   Patient did not answer but left a VM stating the reason of the call &  to call me back  

## 2016-12-08 NOTE — Telephone Encounter (Signed)
-----   Message from Lizbeth BarkMandesia R Hairston, [place]NP sent at 12/06/2016  8:40 AM EST ----- -Hepatitis C was positive which indicates Hepatitis C Infection. -Hepatitis C is caused by a virus which can spread from person to person through contact with blood. This can happen through sex or sharing needles. -Avoid having unprotected sex or any other activity that would expose others to blood to prevent spreading the virus. -Hepatitis C is treatable. You will be referred to Infectious Disease for follow up. HIV negative.

## 2016-12-19 ENCOUNTER — Encounter (HOSPITAL_COMMUNITY): Payer: Self-pay | Admitting: Emergency Medicine

## 2016-12-19 DIAGNOSIS — F17210 Nicotine dependence, cigarettes, uncomplicated: Secondary | ICD-10-CM | POA: Insufficient documentation

## 2016-12-19 DIAGNOSIS — M5020 Other cervical disc displacement, unspecified cervical region: Secondary | ICD-10-CM | POA: Insufficient documentation

## 2016-12-19 DIAGNOSIS — I10 Essential (primary) hypertension: Secondary | ICD-10-CM | POA: Insufficient documentation

## 2016-12-19 DIAGNOSIS — Z79899 Other long term (current) drug therapy: Secondary | ICD-10-CM | POA: Insufficient documentation

## 2016-12-19 DIAGNOSIS — I2510 Atherosclerotic heart disease of native coronary artery without angina pectoris: Secondary | ICD-10-CM | POA: Insufficient documentation

## 2016-12-19 NOTE — ED Triage Notes (Signed)
Pt reports neck pain X3 weeks, went to dr and they suggested she have xray done but pt never went to have xray. States the pain radiates down both arms, burning/tingling sensation.

## 2016-12-19 NOTE — ED Notes (Signed)
Pt updated on delays and wait times

## 2016-12-20 ENCOUNTER — Emergency Department (HOSPITAL_COMMUNITY): Payer: Self-pay

## 2016-12-20 ENCOUNTER — Emergency Department (HOSPITAL_COMMUNITY)
Admission: EM | Admit: 2016-12-20 | Discharge: 2016-12-20 | Disposition: A | Discharge status: Home / Self Care | Payer: Self-pay | Attending: Emergency Medicine | Admitting: Emergency Medicine

## 2016-12-20 DIAGNOSIS — M542 Cervicalgia: Secondary | ICD-10-CM

## 2016-12-20 DIAGNOSIS — M5020 Other cervical disc displacement, unspecified cervical region: Secondary | ICD-10-CM

## 2016-12-20 MED ORDER — TRAMADOL HCL 50 MG PO TABS: 50.0000 mg | ORAL_TABLET | Freq: Four times a day (QID) | ORAL | 0 refills | Status: DC | PRN

## 2016-12-20 MED ORDER — PREDNISONE 10 MG (21) PO TBPK: ORAL_TABLET | Freq: Every day | ORAL | 0 refills | Status: DC

## 2016-12-20 NOTE — Discharge Instructions (Signed)
As we discussed, you were found to have a slightly herniated disc on your CT of the neck today. Take the prescribed medication as directed. Follow-up with your primary care doctor.  They will likely need to refer you to neurosurgical clinic for ongoing management, I have attached your information as well. Return to the ED for new or worsening symptoms-- new numbness or weakness of the arms, progressive back pain, bowel/bladder issues, etc.

## 2016-12-20 NOTE — ED Notes (Signed)
Patient transported to CT 

## 2016-12-20 NOTE — ED Notes (Signed)
ED Provider at bedside. 

## 2016-12-20 NOTE — ED Provider Notes (Signed)
[facility] Provider Note   CSN: [id number] Arrival date & time: 12/19/16  1951     History   Chief Complaint Chief Complaint  Patient presents with  . Neck Pain    HPI Tracy ReeveMarilyn Y Mercado is a 56 y.o. female.  The history is provided by the patient and medical records.  Neck Pain      56 year old female with history of coronary artery disease and hypertension, presenting to the ED with neck pain.  Patient reports she has been having pain for about 3 weeks now.  Localized all throughout neck but worse on the sides of the neck.  States she has pain into her shoulders but states what seems to be troubling her the most is a sensation of her arms being asleep.  States she constantly feels like she has been laying on her arms.  She denies any significant weakness of her arms.  She denies any focal numbness.  States she does have a physically demanding job and uses her hands on a daily basis.  States she does a lot of heavy lifting, pushing, pulling, etc.  [facility] at [facility] and prescribed muscle relaxers as well as anti-inflammatories, but was told at the pharmacy they were out of 1 of the medication so she did not fill the other.  States they placed an order for her to have an outpatient x-ray but she never went to have this done as she works odd hours and it did not suit to have it done during the time frame allotted.  She is not diabetic.  No prior issues with neuropathy.  Past Medical History:  Diagnosis Date  . CAD (coronary artery disease)   . Hypertension     There are no active problems to display for this patient.   Past Surgical History:  Procedure Laterality Date  . KNEE ARTHROSCOPY      OB History    No data available       Home Medications    Prior to Admission medications   Medication Sig Start Date End Date Taking? Authorizing Provider  ibuprofen (ADVIL,MOTRIN) 200 MG tablet Take 400-800 mg by  mouth every 6 (six) hours as needed for fever, headache or moderate pain.    Yes [provider]  lisinopril (PRINIVIL,ZESTRIL) 20 MG tablet Take 1 tablet (20 mg total) by mouth daily. Patient not taking: Reported on 02/17/2014 06/02/12   Charlynne PanderYao, David Hsienta, MD  methocarbamol (ROBAXIN) 500 MG tablet Take 1 tablet (500 mg total) by mouth every 8 (eight) hours as needed for muscle spasms. Patient not taking: Reported on 12/19/2016 12/04/16   Lizbeth BarkHairston, Mandesia R, FNP  Misc. Devices (WRIST BRACE) MISC APPLY LEFT AND RIGHT WRIST BRACE TO BILATERAL WRIST/FOREARM. FOR STABILITY AND SUPPORT. TO BE FITTED BY MEDICAL SUPPLY. 12/04/16   Lizbeth BarkHairston, Mandesia R, FNP  naproxen (NAPROSYN) 500 MG tablet Take 1 tablet (500 mg total) by mouth 2 (two) times daily with a meal. Patient not taking: Reported on 12/19/2016 12/04/16   Lizbeth BarkHairston, Mandesia R, FNP    Family History No family history on file.  Social History Social History   Tobacco Use  . Smoking status: Current Every Day Smoker    Packs/day: 0.50  . Smokeless tobacco: Never Used  Substance Use Topics  . Alcohol use: Yes    Comment: socially  . Drug use: No     Allergies   Patient has no known allergies.   Review of  Systems Review of Systems  Musculoskeletal: Positive for neck pain.  All other systems reviewed and are negative.    Physical Exam Updated Vital Signs BP (!) 158/105 (BP Location: Right Arm)   Pulse 66   Temp (!) 97.5 F (36.4 C) (Oral)   Resp 16   Ht 5\' 7"  (1.702 m)   Wt 77.1 kg (170 lb)   SpO2 99%   BMI 26.63 kg/m   Physical Exam  Constitutional: She is oriented to person, place, and time. She appears well-developed and well-nourished.  HENT:  Head: Normocephalic and atraumatic.  Mouth/Throat: Oropharynx is clear and moist.  Eyes: Conjunctivae and EOM are normal. Pupils are equal, round, and reactive to light.  Neck: Normal range of motion.  Cardiovascular: Normal rate, regular rhythm and normal heart  sounds.  Pulmonary/Chest: Effort normal and breath sounds normal. No stridor. No respiratory distress.  Abdominal: Soft. Bowel sounds are normal. There is no tenderness. There is no rebound.  Musculoskeletal: Normal range of motion.       Back:  Diffuse tenderness of the cervical spine, worse along the paraspinal musculature; there is no midline step-off or deformity; full ROM maintained; normal strength/sensation of both arms; normal coordination; normal sensation throughout  Neurological: She is alert and oriented to person, place, and time.  Skin: Skin is warm and dry.  Psychiatric: She has a normal mood and affect.  Nursing note and vitals reviewed.    ED Treatments / Results  Labs (all labs ordered are listed, but only abnormal results are displayed) Labs Reviewed - No data to display  EKG  EKG Interpretation None       Radiology Ct Cervical Spine Wo Contrast  Result Date: 12/20/2016 CLINICAL DATA:  Neck pain radiating to both upper extremities EXAM: CT CERVICAL SPINE WITHOUT CONTRAST TECHNIQUE: Multidetector CT imaging of the cervical spine was performed without intravenous contrast. Multiplanar CT image reconstructions were also generated. COMPARISON:  Head CT 06/02/2012 FINDINGS: Alignment: No static subluxation. Facets are aligned. Occipital condyles and the lateral masses of C1 and C2 are normally approximated. Skull base and vertebrae: No acute fracture. Soft tissues and spinal canal: No prevertebral fluid or swelling. No visible canal hematoma. Disc levels: Vertebral body heights are preserved. Disc degeneration is greatest at C4-5 and C5-6 with there are small anterior osteophytes. There is a medium-size central disc extrusion at C5-C6 that extends inferiorly to the suprapedicle level of C6. This causes mild narrowing of the central spinal canal. The facet joints are preserved throughout the cervical spine. Upper chest: No pneumothorax, pulmonary nodule or pleural effusion.  Other: Normal visualized paraspinal cervical soft tissues. IMPRESSION: 1. No acute abnormality of the cervical spine. 2. Medium-sized central disc extrusion at C5-C6 with inferior migration to the suprapedicle level of C6, causing mild spinal canal stenosis. 3. No high-grade foraminal stenosis identified. Electronically Signed   By: Deatra Robinson M.D.   On: 12/20/2016 02:30    Procedures Procedures (including critical care time)  Medications Ordered in ED Medications - No data to display   Initial Impression / Assessment and Plan / ED Course  I have reviewed the triage vital signs and the nursing notes.  Pertinent labs & imaging results that were available during my care of the patient were reviewed by me and considered in my medical decision making (see chart for details).  56 y.o. F here with neck pain x 3 weeks.  Reports associated paresthesias down BUE with some occasional pain.  No focal numbness/weakness.  Seen at urgent care and recommended to have x-ray but never had it done.  On exam she has diffuse tenderness of the cervical spine without bony deformity or step-off.  She maintains normal strength/sensation of both arms, no issues with coordination.  States she just feel like her arms are "asleep".  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  Suspect she most likely has a cervical radiculopathy from possible hernia disc.  We will plan for CT of the cervical spine for further evaluation.  CT does reveal medium size disc protrusion at C5-C6 level causing mild spinal canal stenosis.  No high-grade foraminal stenosis noted.  Suspect this is the etiology of patient's symptoms.  She remains neurologically intact here without any signs or symptoms concerning for cord syndrome.  We will initiate steroid taper, pain medication.  She understands to follow-up closely with her primary care doctor.  She was also given information for neurosurgical clinic.  She understands to return here  for any new/worsening symptoms--specifically any new numbness/weakness of the arms, headaches, fever, chills, etc.  She was discharged home in stable condition.    Final Clinical Impressions(s) / ED Diagnoses   Final diagnoses:  Neck pain  Cervical herniated disc    ED Discharge Orders        Ordered    traMADol (ULTRAM) 50 MG tablet  Every 6 hours PRN     12/20/16 0248    predniSONE (STERAPRED UNI-PAK 21 TAB) 10 MG (21) TBPK tablet  Daily     12/20/16 0248       Garlon HatchetSanders, Lempi Edwin M, PA-C 12/20/16 0507    Shon BatonHorton, Courtney F, MD 12/20/16 [phone]

## 2016-12-20 NOTE — ED Notes (Signed)
Unable to obtain signature from pt d/t hallway placement and lack of functional WOW. Reviewed all d/c instructions and prescribed medications. Pt departed in NAD, refused use of wheelchair.

## 2017-01-10 ENCOUNTER — Telehealth: Payer: Self-pay | Admitting: Family Medicine

## 2017-01-10 NOTE — Telephone Encounter (Signed)
Patient called to request for another method of relief other then medication for her herniated disc in her neck. Patient stated she is currently hurting. Patient does not like Tramadol. Please fu.

## 2017-01-16 ENCOUNTER — Other Ambulatory Visit: Payer: Self-pay | Admitting: Family Medicine

## 2017-01-16 DIAGNOSIS — G8929 Other chronic pain: Secondary | ICD-10-CM

## 2017-01-16 DIAGNOSIS — M5412 Radiculopathy, cervical region: Principal | ICD-10-CM

## 2017-01-16 MED ORDER — ACETAMINOPHEN-CODEINE #3 300-30 MG PO TABS: 1.0000 | ORAL_TABLET | Freq: Three times a day (TID) | ORAL | 0 refills | Status: DC | PRN

## 2017-01-16 NOTE — Telephone Encounter (Signed)
Please ask patient if she has gotten tramadol filled? The only other option would by tylenol #3 for or anti-inflammatory for pain. I recommend that she follow up with neurology specialist. If she starts to experience any worsening of her symptoms she needs to go the ED.

## 2017-01-16 NOTE — Telephone Encounter (Signed)
Pt has taken the prednisone and Tramadol.  Is not opposed to taking tylenol #3.

## 2017-01-16 NOTE — Telephone Encounter (Signed)
Script available for pick up. Script placed in [employer]'s mailbox.

## 2017-01-17 NOTE — Telephone Encounter (Signed)
Script placed at front desk.

## 2018-05-06 ENCOUNTER — Emergency Department (HOSPITAL_COMMUNITY)
Admission: EM | Admit: 2018-05-06 | Discharge: 2018-05-06 | Disposition: A | Discharge status: Home / Self Care | Payer: Self-pay | Attending: Emergency Medicine | Admitting: Emergency Medicine

## 2018-05-06 ENCOUNTER — Other Ambulatory Visit: Payer: Self-pay

## 2018-05-06 ENCOUNTER — Encounter (HOSPITAL_COMMUNITY): Payer: Self-pay | Admitting: Emergency Medicine

## 2018-05-06 DIAGNOSIS — M25562 Pain in left knee: Secondary | ICD-10-CM | POA: Insufficient documentation

## 2018-05-06 DIAGNOSIS — Z79899 Other long term (current) drug therapy: Secondary | ICD-10-CM | POA: Insufficient documentation

## 2018-05-06 DIAGNOSIS — I2510 Atherosclerotic heart disease of native coronary artery without angina pectoris: Secondary | ICD-10-CM | POA: Insufficient documentation

## 2018-05-06 DIAGNOSIS — F17200 Nicotine dependence, unspecified, uncomplicated: Secondary | ICD-10-CM | POA: Insufficient documentation

## 2018-05-06 DIAGNOSIS — I10 Essential (primary) hypertension: Secondary | ICD-10-CM | POA: Insufficient documentation

## 2018-05-06 NOTE — ED Triage Notes (Signed)
Pt c/o L knee pain x 2 days. Denies any injuries, able to ambulate/bear weight

## 2018-05-06 NOTE — Discharge Instructions (Addendum)
Please read attached information. If you experience any new or worsening signs or symptoms please return to the emergency room for evaluation. Please follow-up with your primary care provider or specialist as discussed.  °

## 2018-05-06 NOTE — Progress Notes (Signed)
Orthopedic Tech Progress Note Patient Details:  Tracy Mercado 10-Oct-1960 [id number] Applied ace wrap to the left knee Ortho Devices Type of Ortho Device: Crutches, Ace wrap Ortho Device/Splint Location: LLE Ortho Device/Splint Interventions: Adjustment, Application, Ordered   Post Interventions Patient Tolerated: Well Instructions Provided: Poper ambulation with device, Care of device, Adjustment of device   Donald Pore 05/06/2018, 11:44 AM

## 2018-05-06 NOTE — ED Notes (Signed)
Patient verbalizes understanding of discharge instructions. Opportunity for questioning and answers were provided. Armband removed by staff, pt discharged from ED ambulatory to home.  

## 2018-05-06 NOTE — ED Provider Notes (Signed)
[facility]   CSN: [id number] Arrival date & time: 05/06/18  [id number]    History   Chief Complaint Chief Complaint  Patient presents with   Knee Pain    HPI Tracy Mercado is a 58 y.o. female.     HPI   58 year old female presents today with complaints of left-sided knee pain.  Patient notes a 1 day history of pain to the medial aspect of her left knee.  She denies any trauma, no falls.  She denies any swelling, redness or warmth.  She denies fever.  She notes range of motion is somewhat reduced but can flex and extend without significant difficulty.  She notes no medications at home.  Reports that she works at [employer] and is on her feet for 8 hours a day 6 days a week.  Past Medical History:  Diagnosis Date   CAD (coronary artery disease)    Hypertension     There are no active problems to display for this patient.   Past Surgical History:  Procedure Laterality Date   KNEE ARTHROSCOPY       OB History   No obstetric history on file.      Home Medications    Prior to Admission medications   Medication Sig Start Date End Date Taking? Authorizing Provider  acetaminophen-codeine (TYLENOL #3) 300-30 MG tablet Take 1-2 tablets by mouth every 8 (eight) hours as needed for severe pain. 01/16/17   Lizbeth BarkHairston, Mandesia R, FNP  ibuprofen (ADVIL,MOTRIN) 200 MG tablet Take 400-800 mg by mouth every 6 (six) hours as needed for fever, headache or moderate pain.     [provider]  lisinopril (PRINIVIL,ZESTRIL) 20 MG tablet Take 1 tablet (20 mg total) by mouth daily. Patient not taking: Reported on 02/17/2014 06/02/12   Charlynne PanderYao, David Hsienta, MD  methocarbamol (ROBAXIN) 500 MG tablet Take 1 tablet (500 mg total) by mouth every 8 (eight) hours as needed for muscle spasms. Patient not taking: Reported on 12/19/2016 12/04/16   Lizbeth BarkHairston, Mandesia R, FNP  Misc. Devices (WRIST BRACE) MISC APPLY LEFT AND RIGHT WRIST BRACE  TO BILATERAL WRIST/FOREARM. FOR STABILITY AND SUPPORT. TO BE FITTED BY MEDICAL SUPPLY. 12/04/16   Lizbeth BarkHairston, Mandesia R, FNP  naproxen (NAPROSYN) 500 MG tablet Take 1 tablet (500 mg total) by mouth 2 (two) times daily with a meal. Patient not taking: Reported on 12/19/2016 12/04/16   Lizbeth BarkHairston, Mandesia R, FNP  predniSONE (STERAPRED UNI-PAK 21 TAB) 10 MG (21) TBPK tablet Take by mouth daily. Take 6 tabs by mouth daily  for 2 days, then 5 tabs for 2 days, then 4 tabs for 2 days, then 3 tabs for 2 days, 2 tabs for 2 days, then 1 tab by mouth daily for 2 days 12/20/16   Garlon HatchetSanders, Lisa M, PA-C  traMADol (ULTRAM) 50 MG tablet Take 1 tablet (50 mg total) by mouth every 6 (six) hours as needed. 12/20/16   Garlon HatchetSanders, Lisa M, PA-C    Family History No family history on file.  Social History Social History   Tobacco Use   Smoking status: Current Every Day Smoker    Packs/day: 0.50   Smokeless tobacco: Never Used  Substance Use Topics   Alcohol use: Yes    Comment: socially   Drug use: No     Allergies   Patient has no known allergies.   Review of Systems Review of Systems  All other systems reviewed and are negative.  Physical Exam Updated Vital Signs BP (!) 189/89 (BP Location: Right Arm)    Pulse 78    Temp 98.9 F (37.2 C) (Oral)    Resp 16    Wt 77.1 kg    SpO2 98%    BMI 26.62 kg/m   Physical Exam Vitals signs and nursing note reviewed.  Constitutional:      Appearance: She is well-developed.  HENT:     Head: Normocephalic and atraumatic.  Eyes:     General: No scleral icterus.       Right eye: No discharge.        Left eye: No discharge.     Conjunctiva/sclera: Conjunctivae normal.     Pupils: Pupils are equal, round, and reactive to light.  Neck:     Musculoskeletal: Normal range of motion.     Vascular: No JVD.     Trachea: No tracheal deviation.  Pulmonary:     Effort: Pulmonary effort is normal.     Breath sounds: No stridor.  Musculoskeletal:     Comments:  Left leg atraumatic no swelling, redness, or edema, tenderness palpation along the medial joint line, no laxity with valgus or varus, no significant posterior tibial translation, greater than 90 degrees of flexion noted  Neurological:     Mental Status: She is alert and oriented to person, place, and time.     Coordination: Coordination normal.  Psychiatric:        Behavior: Behavior normal.        Thought Content: Thought content normal.        Judgment: Judgment normal.      ED Treatments / Results  Labs (all labs ordered are listed, but only abnormal results are displayed) Labs Reviewed - No data to display  EKG None  Radiology No results found.  Procedures Procedures (including critical care time)  Medications Ordered in ED Medications - No data to display   Initial Impression / Assessment and Plan / ED Course  I have reviewed the triage vital signs and the nursing notes.  Pertinent labs & imaging results that were available during my care of the patient were reviewed by me and considered in my medical decision making (see chart for details).          Assessment/Plan: 58 year old female presents today with complaints of acute side of left knee pain.  High suspicion for MCL sprain, suspicion for septic arthritis, gout, or significant internal derangement.  Patient will be placed in a's, given rice instructions, crutches.  She will follow-up as an outpatient with orthopedic if symptoms persist.  She is given strict return precautions.  She verbalized understanding and agreement to today's plan had no further questions or concerns at the time of discharge.      Final Clinical Impressions(s) / ED Diagnoses   Final diagnoses:  Acute pain of left knee    ED Discharge Orders    None       Rosalio Loud 05/06/18 1151    Melene Plan, DO 05/06/18 1507

## 2019-02-05 ENCOUNTER — Ambulatory Visit (INDEPENDENT_AMBULATORY_CARE_PROVIDER_SITE_OTHER): Payer: Self-pay

## 2019-02-05 ENCOUNTER — Ambulatory Visit (HOSPITAL_COMMUNITY)
Admission: EM | Admit: 2019-02-05 | Discharge: 2019-02-05 | Disposition: A | Discharge status: Home / Self Care | Payer: Self-pay | Attending: Family Medicine | Admitting: Family Medicine

## 2019-02-05 ENCOUNTER — Encounter (HOSPITAL_COMMUNITY): Payer: Self-pay | Admitting: Emergency Medicine

## 2019-02-05 ENCOUNTER — Other Ambulatory Visit: Payer: Self-pay

## 2019-02-05 DIAGNOSIS — M79671 Pain in right foot: Secondary | ICD-10-CM

## 2019-02-05 DIAGNOSIS — S9031XA Contusion of right foot, initial encounter: Secondary | ICD-10-CM

## 2019-02-05 MED ORDER — IBUPROFEN 800 MG PO TABS: 800.0000 mg | ORAL_TABLET | Freq: Three times a day (TID) | ORAL | 0 refills | Status: DC

## 2019-02-05 NOTE — ED Provider Notes (Signed)
[facility]    CSN: [id number] Arrival date & time: 02/05/19  [id number]      History   Chief Complaint Chief Complaint  Patient presents with  . Foot Pain    HPI Tracy Mercado is a 59 y.o. female.   Tracy Mercado presents with complaints of right foot pain after a full 2L soda bottle was dropped on it, cap down, approximately 1 week ago. Pain hasn't improved. Bruising and some swelling present. No numbness tingling or weakness, maybe some decreased sensation to toes. Denies  Any previous foot injury. Pain with touch. Has been walking on the foot but this is still somewhat painful. Has been soaking it and has tried ibuprofen which hasn't helped. No break in the skin.     ROS per HPI, negative if not otherwise mentioned.       Past Medical History:  Diagnosis Date  . CAD (coronary artery disease)   . Hypertension     There are no problems to display for this patient.   Past Surgical History:  Procedure Laterality Date  . KNEE ARTHROSCOPY      OB History   No obstetric history on file.      Home Medications    Prior to Admission medications   Medication Sig Start Date End Date Taking? Authorizing Provider  acetaminophen-codeine (TYLENOL #3) 300-30 MG tablet Take 1-2 tablets by mouth every 8 (eight) hours as needed for severe pain. 01/16/17   Alfonse Spruce, FNP  ibuprofen (ADVIL) 800 MG tablet Take 1 tablet (800 mg total) by mouth 3 (three) times daily. 02/05/19   Zigmund Gottron, NP  lisinopril (PRINIVIL,ZESTRIL) 20 MG tablet Take 1 tablet (20 mg total) by mouth daily. Patient not taking: Reported on 02/17/2014 06/02/12   Drenda Freeze, MD  methocarbamol (ROBAXIN) 500 MG tablet Take 1 tablet (500 mg total) by mouth every 8 (eight) hours as needed for muscle spasms. Patient not taking: Reported on 12/19/2016 12/04/16   Alfonse Spruce, FNP  Misc. Devices (WRIST BRACE) MISC APPLY LEFT AND RIGHT WRIST BRACE TO BILATERAL WRIST/FOREARM. FOR  STABILITY AND SUPPORT. TO BE FITTED BY MEDICAL SUPPLY. 12/04/16   Alfonse Spruce, FNP  naproxen (NAPROSYN) 500 MG tablet Take 1 tablet (500 mg total) by mouth 2 (two) times daily with a meal. Patient not taking: Reported on 12/19/2016 12/04/16   Alfonse Spruce, FNP  predniSONE (STERAPRED UNI-PAK 21 TAB) 10 MG (21) TBPK tablet Take by mouth daily. Take 6 tabs by mouth daily  for 2 Mercado, then 5 tabs for 2 Mercado, then 4 tabs for 2 Mercado, then 3 tabs for 2 Mercado, 2 tabs for 2 Mercado, then 1 tab by mouth daily for 2 Mercado 12/20/16   Larene Pickett, PA-C  traMADol (ULTRAM) 50 MG tablet Take 1 tablet (50 mg total) by mouth every 6 (six) hours as needed. 12/20/16   Larene Pickett, PA-C    Family History History reviewed. No pertinent family history.  Social History Social History   Tobacco Use  . Smoking status: Current Every Day Smoker    Packs/day: 0.50  . Smokeless tobacco: Never Used  Substance Use Topics  . Alcohol use: Yes    Comment: socially  . Drug use: No     Allergies   Patient has no known allergies.   Review of Systems Review of Systems   Physical Exam Triage Vital Signs ED Triage Vitals  Enc Vitals Group  BP 02/05/19 1942 (!) 141/94     Pulse Rate 02/05/19 1942 80     Resp 02/05/19 1942 20     Temp 02/05/19 1942 98.1 F (36.7 C)     Temp Source 02/05/19 1942 Oral     SpO2 02/05/19 1942 99 %     Weight --      Height --      Head Circumference --      Peak Flow --      Pain Score 02/05/19 1936 10     Pain Loc --      Pain Edu? --      Excl. in GC? --    No data found.  Updated Vital Signs BP (!) 141/94 (BP Location: Right Arm)   Pulse 80   Temp 98.1 F (36.7 C) (Oral)   Resp 20   SpO2 99%    Physical Exam Constitutional:      General: She is not in acute distress.    Appearance: She is well-developed.  Cardiovascular:     Rate and Rhythm: Normal rate.  Pulmonary:     Effort: Pulmonary effort is normal.  Musculoskeletal:     Right  foot: Decreased range of motion. Swelling, tenderness and bony tenderness present. No deformity, foot drop or laceration.       Legs:     Comments: Dorsum of right foot overlying 4th metatarsal, with bruising and surrounding tenderness, as well as some swelling; pain at 2nd-5th mtp joints with pain with movement of toes; cap refill < 2 seconds; strong pedal pulse   Skin:    General: Skin is warm and dry.  Neurological:     Mental Status: She is alert and oriented to person, place, and time.      UC Treatments / Results  Labs (all labs ordered are listed, but only abnormal results are displayed) Labs Reviewed - No data to display  EKG   Radiology DG Foot Complete Right  Result Date: 02/05/2019 CLINICAL DATA:  Status post trauma. EXAM: RIGHT FOOT COMPLETE - 3+ VIEW COMPARISON:  November 25, 2013 FINDINGS: There is no evidence of acute fracture or dislocation. A small chronic fracture deformity is seen involving the proximal phalanx of the fifth right toe. There is no evidence of arthropathy or other focal bone abnormality. Soft tissues are unremarkable. IMPRESSION: No acute fracture or dislocation. Electronically Signed   By: Aram Candela M.D.   On: 02/05/2019 20:15    Procedures Procedures (including critical care time)  Medications Ordered in UC Medications - No data to display  Initial Impression / Assessment and Plan / UC Course  I have reviewed the triage vital signs and the nursing notes.  Pertinent labs & imaging results that were available during my care of the patient were reviewed by me and considered in my medical decision making (see chart for details).     Xray without acute findings today. Obvious contusion. Ace wrap applied to aid with swelling. Pain management discussed. Return precautions provided. Patient verbalized understanding and agreeable to plan.   Final Clinical Impressions(s) / UC Diagnoses   Final diagnoses:  Contusion of right foot, initial  encounter     Discharge Instructions     Your xray is normal tonight which is reassuring. Nothing is broken.  Obvious bruised.  Ice, elevation, ibuprofen every 8 hours, ace wrap for comfort.  You should expect gradual improvement over the next 2-4 weeks. Follow up with your PCP as needed if symptoms  persist.     ED Prescriptions    Medication Sig Dispense Auth. Provider   ibuprofen (ADVIL) 800 MG tablet Take 1 tablet (800 mg total) by mouth 3 (three) times daily. 21 tablet Georgetta Haber, NP     PDMP not reviewed this encounter.   Georgetta Haber, NP 02/07/19 [phone]

## 2019-02-05 NOTE — Discharge Instructions (Addendum)
Your xray is normal tonight which is reassuring. Nothing is broken.  Obvious bruised.  Ice, elevation, ibuprofen every 8 hours, ace wrap for comfort.  You should expect gradual improvement over the next 2-4 weeks. Follow up with your PCP as needed if symptoms persist.

## 2019-02-05 NOTE — ED Triage Notes (Signed)
Dropped a full, 2 - liter bottle of soda on right foot.  Right pedal pulses 2 +.  Visible swelling and patient reports pain in foot, particularly behind little toe and adjacent toe

## 2019-06-18 ENCOUNTER — Encounter (HOSPITAL_COMMUNITY): Payer: Self-pay | Admitting: Emergency Medicine

## 2019-06-18 ENCOUNTER — Other Ambulatory Visit: Payer: Self-pay

## 2019-06-18 ENCOUNTER — Ambulatory Visit (HOSPITAL_COMMUNITY)
Admission: EM | Admit: 2019-06-18 | Discharge: 2019-06-18 | Disposition: A | Discharge status: Home / Self Care | Payer: Self-pay | Attending: Emergency Medicine | Admitting: Emergency Medicine

## 2019-06-18 DIAGNOSIS — R1013 Epigastric pain: Secondary | ICD-10-CM

## 2019-06-18 DIAGNOSIS — K2900 Acute gastritis without bleeding: Secondary | ICD-10-CM

## 2019-06-18 MED ORDER — SUCRALFATE 1 GM/10ML PO SUSP: 1.0000 g | Freq: Four times a day (QID) | ORAL | 0 refills | Status: DC

## 2019-06-18 MED ORDER — PANTOPRAZOLE SODIUM 20 MG PO TBEC: 20.0000 mg | DELAYED_RELEASE_TABLET | Freq: Every day | ORAL | 0 refills | Status: DC

## 2019-06-18 MED ORDER — FAMOTIDINE 20 MG PO TABS: 20.0000 mg | ORAL_TABLET | Freq: Two times a day (BID) | ORAL | 0 refills | Status: DC

## 2019-06-18 NOTE — ED Triage Notes (Signed)
Pt c/o upper/epigastric abd pain onset Monday. Pt states it is a constant stabbing pain. She has felt nauseous. Denies vomiting or diarrhea.

## 2019-06-18 NOTE — Discharge Instructions (Addendum)
The Protonix may take up to 3 days for it to have its full effect.  Pepcid and Carafate should work right away.  Bland diet.  Follow-up with a primary care physician of your choice.  I have put in a referral, but also start calling the providers on the list below.  It is important that you have routine preventative care, especially with your medical history.  Go Immediately to the ER for the signs and symptoms we discussed.  Below is a list of primary care practices who are taking new patients for you to follow-up with.  [facility] internal medicine clinic Ground Floor - [facility], [address] [place], [place], [place] [address] [phone]  [facility] at [facility] [address] [place], [place] [address] [phone]  [facility] [address] [place], [place] [address] [phone]  [facility] [phone] [address] [place] [address]  [facility]: [address] [place] [address]  [phone]  [facility]: [address] [place] [address]   [phone]  [facility]: [address] [place] [address]  [phone]  [facility] : [address] [place] [address] [phone]  [facility]: [address] [place] [address] [phone]  [facility]: [address] [place] [address] [phone]  Dr. Oneal Grout [address] [facility] [place] [address]  [phone]  Dr. Jackie Plum, [facility]. [address] [place], [place] [address]  [phone]  Go to www.goodrx.com to look up your medications. This will give you a list of where you can find your prescriptions at the  most affordable prices. Or ask the pharmacist what the cash price is, or if they have any other discount programs available to help make your medication more affordable. This can be less expensive than what you would pay with insurance.

## 2019-06-18 NOTE — ED Provider Notes (Signed)
HPI  SUBJECTIVE:  Tracy Mercado is a 59 y.o. female who presents with 2 days of constant nonmigratory, nonradiating stabbing sore epigastric pain and tenderness.  She reports some nausea.  No vomiting, fevers, chest pain, shortness of breath, GERD symptoms.  No abdominal distention.  Had a normal bowel movement yesterday.  No exertional component.  No trauma to the area.  She has had symptoms like this before when she had a nonperforated ulcer/peptic ulcer disease.  States that she has been under an increased amount of stress recently.  She tried Pepto-Bismol without improvement in her symptoms.  Symptoms are worse with eating.  She has a past medical history of hypertension, coronary disease, smoking, peptic ulcer disease.  No history of regular NSAID use, alcohol abuse, pancreatitis, MI, diabetes, hypercholesterolemia, abdominal surgeries, mesenteric ischemia, atrial fibrillation, hypercoagulability.  PMD: None.   Past Medical History:  Diagnosis Date  . CAD (coronary artery disease)   . Hypertension     Past Surgical History:  Procedure Laterality Date  . KNEE ARTHROSCOPY      Family History  Family history unknown: Yes    Social History   Tobacco Use  . Smoking status: Current Every Day Smoker    Packs/day: 0.50  . Smokeless tobacco: Never Used  Substance Use Topics  . Alcohol use: Yes    Comment: socially  . Drug use: No    No current facility-administered medications for this encounter.  Current Outpatient Medications:  .  famotidine (PEPCID) 20 MG tablet, Take 1 tablet (20 mg total) by mouth 2 (two) times daily., Disp: 40 tablet, Rfl: 0 .  lisinopril (PRINIVIL,ZESTRIL) 20 MG tablet, Take 1 tablet (20 mg total) by mouth daily. (Patient not taking: Reported on 02/17/2014), Disp: 30 tablet, Rfl: 0 .  Misc. Devices (WRIST BRACE) MISC, APPLY LEFT AND RIGHT WRIST BRACE TO BILATERAL WRIST/FOREARM. FOR STABILITY AND SUPPORT. TO BE FITTED BY MEDICAL SUPPLY., Disp: 2 each,  Rfl: 0 .  pantoprazole (PROTONIX) 20 MG tablet, Take 1 tablet (20 mg total) by mouth daily., Disp: 30 tablet, Rfl: 0 .  sucralfate (CARAFATE) 1 GM/10ML suspension, Take 10 mLs (1 g total) by mouth 4 (four) times daily. 10 mL before meals and before bedtime, Disp: 240 mL, Rfl: 0  No Known Allergies   ROS  As noted in HPI.   Physical Exam  BP (!) 144/98 (BP Location: Left Arm)   Pulse 84   Temp 99 F (37.2 C) (Oral)   Resp 15   SpO2 100%   Constitutional: Well developed, well nourished, no acute distress Eyes:  EOMI, conjunctiva normal bilaterally HENT: Normocephalic, atraumatic,mucus membranes moist Respiratory: Normal inspiratory effort Cardiovascular: Normal rate regular rhythm no murmurs rubs or gallops GI: Normal appearance, soft.  Positive epigastric tenderness.  No guarding, rebound.  Nondistended.  Active bowel sounds.  Negative Murphy. Back: No CVAT skin: No rash, skin intact Musculoskeletal: no deformities Neurologic: Alert & oriented x 3, no focal neuro deficits Psychiatric: Speech and behavior appropriate   ED Course   Medications - No data to display  Orders Placed This Encounter  Procedures  . Ambulatory referral to Internal Medicine    Referral Priority:   Routine    Referral Type:   Consultation    Referral Reason:   Specialty Services Required    Requested Specialty:   Internal Medicine    Number of Visits Requested:   1  . ED EKG    Epigastric pain    Standing Status:  Standing    Number of Occurrences:   1    Order Specific Question:   Reason for Exam    Answer:   Other (See Comments)    No results found for this or any previous visit (from the past 24 hour(s)). No results found.  ED Clinical Impression  1. Abdominal pain, epigastric   2. Acute gastritis, presence of bleeding unspecified, unspecified gastritis type      ED Assessment/Plan  Checking EKG as patient does have several cardiac risk factors.  Pt's abd exam is benign, no  peritoneal signs. No evidence of surgical abd. Doubt SBO, mesenteric ischemia, appendicitis, hepatitis, cholecystitis, pancreatitis, or perforated viscus.    EKG: Normal sinus rhythm with sinus arrhythmia, rate 72.  Normal axis, normal intervals.  No change compared to EKG from 06/2011.  Patient was symptomatic while EKG was obtained.  EKG is reassuring. Presentation most consistent with gastritis/peptic ulcer disease. Sending home with Carafate, Pepcid, Protonix, bland diet.  Discussed with her that Carafate may be expensive.  Will order internal medicine referral, provide primary care list for ongoing care.  Emphasized importance of routine care especially with her medical history.  Discussed MDM, treatment plan, and plan for follow-up with patient. Discussed sn/sx that should prompt return to the ED. patient agrees with plan.   Meds ordered this encounter  Medications  . famotidine (PEPCID) 20 MG tablet    Sig: Take 1 tablet (20 mg total) by mouth 2 (two) times daily.    Dispense:  40 tablet    Refill:  0  . sucralfate (CARAFATE) 1 GM/10ML suspension    Sig: Take 10 mLs (1 g total) by mouth 4 (four) times daily. 10 mL before meals and before bedtime    Dispense:  240 mL    Refill:  0  . pantoprazole (PROTONIX) 20 MG tablet    Sig: Take 1 tablet (20 mg total) by mouth daily.    Dispense:  30 tablet    Refill:  0    *This clinic note was created using Scientist, clinical (histocompatibility and immunogenetics). Therefore, there may be occasional mistakes despite careful proofreading.   ?    Domenick Gong, MD 06/19/19 [phone]

## 2019-10-09 ENCOUNTER — Emergency Department (HOSPITAL_COMMUNITY): Payer: Self-pay

## 2019-10-09 ENCOUNTER — Emergency Department (HOSPITAL_COMMUNITY)
Admission: EM | Admit: 2019-10-09 | Discharge: 2019-10-09 | Disposition: A | Discharge status: Home / Self Care | Payer: Self-pay | Attending: Emergency Medicine | Admitting: Emergency Medicine

## 2019-10-09 ENCOUNTER — Other Ambulatory Visit: Payer: Self-pay

## 2019-10-09 ENCOUNTER — Encounter (HOSPITAL_COMMUNITY): Payer: Self-pay

## 2019-10-09 DIAGNOSIS — F17200 Nicotine dependence, unspecified, uncomplicated: Secondary | ICD-10-CM | POA: Insufficient documentation

## 2019-10-09 DIAGNOSIS — N10 Acute tubulo-interstitial nephritis: Secondary | ICD-10-CM | POA: Insufficient documentation

## 2019-10-09 DIAGNOSIS — Z20822 Contact with and (suspected) exposure to covid-19: Secondary | ICD-10-CM | POA: Insufficient documentation

## 2019-10-09 DIAGNOSIS — Z79899 Other long term (current) drug therapy: Secondary | ICD-10-CM | POA: Insufficient documentation

## 2019-10-09 DIAGNOSIS — I119 Hypertensive heart disease without heart failure: Secondary | ICD-10-CM | POA: Insufficient documentation

## 2019-10-09 DIAGNOSIS — N12 Tubulo-interstitial nephritis, not specified as acute or chronic: Secondary | ICD-10-CM

## 2019-10-09 LAB — COMPREHENSIVE METABOLIC PANEL
ALT: 20 U/L (ref 0–44)
AST: 20 U/L (ref 15–41)
Albumin: 3.8 g/dL (ref 3.5–5.0)
Alkaline Phosphatase: 74 U/L (ref 38–126)
Anion gap: 8 (ref 5–15)
BUN: 8 mg/dL (ref 6–20)
CO2: 24 mmol/L (ref 22–32)
Calcium: 9.3 mg/dL (ref 8.9–10.3)
Chloride: 98 mmol/L (ref 98–111)
Creatinine, Ser: 0.7 mg/dL (ref 0.44–1.00)
GFR calc non Af Amer: >60 mL/min (ref >60)
Glucose, Bld: 105 mg/dL — ABNORMAL HIGH (ref 70–99)
Potassium: 3.3 mmol/L — ABNORMAL LOW (ref 3.5–5.1)
Sodium: 130 mmol/L — ABNORMAL LOW (ref 135–145)
Total Bilirubin: 1.4 mg/dL — ABNORMAL HIGH (ref 0.3–1.2)
Total Protein: 8.6 g/dL — ABNORMAL HIGH (ref 6.5–8.1)

## 2019-10-09 LAB — URINALYSIS, ROUTINE W REFLEX MICROSCOPIC
Bilirubin Urine: NEGATIVE
Glucose, UA: NEGATIVE mg/dL
Ketones, ur: 5 mg/dL — AB
Nitrite: NEGATIVE
Protein, ur: 30 mg/dL — AB
Specific Gravity, Urine: 1.014 (ref 1.005–1.030)
pH: 6 (ref 5.0–8.0)

## 2019-10-09 LAB — CBC WITH DIFFERENTIAL/PLATELET
Abs Immature Granulocytes: 0.06 10*3/uL (ref 0.00–0.07)
Basophils Absolute: 0 10*3/uL (ref 0.0–0.1)
Basophils Relative: 0 %
Eosinophils Absolute: 0.2 10*3/uL (ref 0.0–0.5)
Eosinophils Relative: 2 %
HCT: 39.1 % (ref 36.0–46.0)
Hemoglobin: 13.4 g/dL (ref 12.0–15.0)
Immature Granulocytes: 1 %
Lymphocytes Relative: 15 %
Lymphs Abs: 1.5 10*3/uL (ref 0.7–4.0)
MCH: 34.7 pg — ABNORMAL HIGH (ref 26.0–34.0)
MCHC: 34.3 g/dL (ref 30.0–36.0)
MCV: 101.3 fL — ABNORMAL HIGH (ref 80.0–100.0)
Monocytes Absolute: 2.2 10*3/uL — ABNORMAL HIGH (ref 0.1–1.0)
Monocytes Relative: 21 %
Neutro Abs: 6.2 10*3/uL (ref 1.7–7.7)
Neutrophils Relative %: 61 %
Platelets: 95 10*3/uL — ABNORMAL LOW (ref 150–400)
RBC: 3.86 MIL/uL — ABNORMAL LOW (ref 3.87–5.11)
RDW: 13.4 % (ref 11.5–15.5)
WBC: 10.2 10*3/uL (ref 4.0–10.5)
nRBC: 0 % (ref 0.0–0.2)

## 2019-10-09 LAB — RESPIRATORY PANEL BY RT PCR (FLU A&B, COVID)
Influenza A by PCR: NEGATIVE
Influenza B by PCR: NEGATIVE
SARS Coronavirus 2 by RT PCR: NEGATIVE

## 2019-10-09 MED ORDER — POTASSIUM CHLORIDE CRYS ER 20 MEQ PO TBCR
20.0000 meq | EXTENDED_RELEASE_TABLET | Freq: Once | ORAL | Status: AC
  Administered 2019-10-09: 15:00:00 20 meq via ORAL
  Filled 2019-10-09: qty 1

## 2019-10-09 MED ORDER — POTASSIUM CHLORIDE ER 10 MEQ PO TBCR: 10.0000 meq | EXTENDED_RELEASE_TABLET | Freq: Every day | ORAL | 0 refills | Status: DC

## 2019-10-09 MED ORDER — CEPHALEXIN 500 MG PO CAPS: 500.0000 mg | ORAL_CAPSULE | Freq: Four times a day (QID) | ORAL | 0 refills | Status: AC

## 2019-10-09 MED ORDER — ACETAMINOPHEN 325 MG PO TABS
650.0000 mg | ORAL_TABLET | Freq: Once | ORAL | Status: AC
  Administered 2019-10-09: 13:00:00 650 mg via ORAL
  Filled 2019-10-09: qty 2

## 2019-10-09 NOTE — ED Provider Notes (Signed)
[facility]-EMERGENCY DEPT Provider Note   CSN: [id number] Arrival date & time: 10/09/19  1200     History Chief Complaint  Patient presents with  . Generalized Body Aches  . Fever    Tracy Mercado is a 59 y.o. female.  HPI 59 year old female with history of CAD and hypertension presents to the ER with 2-day history of generalized body aches and fever.  Patient states that 3 days ago she began to develop back pain which then developed into generalized body aches.  She stated that she has a fever of 103 last night.  She overall has some feeling of malaise, however has still been eating and drinking.  She denies any abdominal pain, nausea, vomiting.  Denies cough, nasal congestion, headache.  She is not vaccinated for Covid.  She denies any dysuria, or diarrhea.  She has not taken anything for her symptoms.  Pain is constant, throbbing.  Denies any injuries or falls.    Past Medical History:  Diagnosis Date  . CAD (coronary artery disease)   . Hypertension     There are no problems to display for this patient.   Past Surgical History:  Procedure Laterality Date  . KNEE ARTHROSCOPY       OB History   No obstetric history on file.     Family History  Family history unknown: Yes    Social History   Tobacco Use  . Smoking status: Current Every Day Smoker    Packs/day: 0.50  . Smokeless tobacco: Never Used  Substance Use Topics  . Alcohol use: Yes    Comment: socially  . Drug use: No    Home Medications Prior to Admission medications   Medication Sig Start Date End Date Taking? Authorizing Provider  cephALEXin (KEFLEX) 500 MG capsule Take 1 capsule (500 mg total) by mouth every 6 (six) hours for 10 days. 10/09/19 10/19/19  Mare Ferrari, PA-C  famotidine (PEPCID) 20 MG tablet Take 1 tablet (20 mg total) by mouth 2 (two) times daily. 06/18/19   Domenick Gong, MD  lisinopril (PRINIVIL,ZESTRIL) 20 MG tablet Take 1 tablet (20 mg total) by  mouth daily. Patient not taking: Reported on 02/17/2014 06/02/12   Charlynne Pander, MD  Misc. Devices (WRIST BRACE) MISC APPLY LEFT AND RIGHT WRIST BRACE TO BILATERAL WRIST/FOREARM. FOR STABILITY AND SUPPORT. TO BE FITTED BY MEDICAL SUPPLY. 12/04/16   Lizbeth Bark, FNP  pantoprazole (PROTONIX) 20 MG tablet Take 1 tablet (20 mg total) by mouth daily. 06/18/19   Domenick Gong, MD  potassium chloride (KLOR-CON) 10 MEQ tablet Take 1 tablet (10 mEq total) by mouth daily. 10/09/19 11/08/19  Mare Ferrari, PA-C  sucralfate (CARAFATE) 1 GM/10ML suspension Take 10 mLs (1 g total) by mouth 4 (four) times daily. 10 mL before meals and before bedtime 06/18/19   Domenick Gong, MD    Allergies    Patient has no known allergies.  Review of Systems   Review of Systems  Constitutional: Positive for fever. Negative for chills.  HENT: Negative for ear pain and sore throat.   Eyes: Negative for pain and visual disturbance.  Respiratory: Negative for cough and shortness of breath.   Cardiovascular: Negative for chest pain and palpitations.  Gastrointestinal: Negative for abdominal pain and vomiting.  Genitourinary: Negative for dysuria and hematuria.  Musculoskeletal: Positive for back pain. Negative for arthralgias.  Skin: Negative for color change and rash.  Neurological: Negative for seizures and syncope.  All other systems  reviewed and are negative.   Physical Exam Updated Vital Signs BP (!) 151/88 (BP Location: Right Arm)   Pulse 87   Temp (!) 101.4 F (38.6 C) (Oral)   Resp 16   SpO2 98%   Physical Exam Vitals and nursing note reviewed.  Constitutional:      General: She is not in acute distress.    Appearance: She is well-developed. She is not ill-appearing or diaphoretic.  HENT:     Head: Normocephalic and atraumatic.     Mouth/Throat:     Mouth: Mucous membranes are moist.     Pharynx: Oropharynx is clear.  Eyes:     Conjunctiva/sclera: Conjunctivae normal.    Cardiovascular:     Rate and Rhythm: Normal rate and regular rhythm.     Pulses: Normal pulses.     Heart sounds: Normal heart sounds. No murmur heard.   Pulmonary:     Effort: Pulmonary effort is normal. No respiratory distress.     Breath sounds: Normal breath sounds.  Abdominal:     General: Abdomen is flat.     Palpations: Abdomen is soft.     Tenderness: There is no abdominal tenderness. There is right CVA tenderness and left CVA tenderness.  Musculoskeletal:        General: Tenderness present. Normal range of motion.     Cervical back: Normal range of motion and neck supple.     Comments: Bilateral flank tenderness, no C, T, LSPINE tenderness, moving all 4 extremities without difficulty, neurovascularly intact   Skin:    General: Skin is warm and dry.     Findings: No erythema or rash.  Neurological:     General: No focal deficit present.     Mental Status: She is alert.     Sensory: No sensory deficit.     Motor: No weakness.     ED Results / Procedures / Treatments   Labs (all labs ordered are listed, but only abnormal results are displayed) Labs Reviewed  CBC WITH DIFFERENTIAL/PLATELET - Abnormal; Notable for the following components:      Result Value   RBC 3.86 (*)    MCV 101.3 (*)    MCH 34.7 (*)    Platelets 95 (*)    Monocytes Absolute 2.2 (*)    All other components within normal limits  COMPREHENSIVE METABOLIC PANEL - Abnormal; Notable for the following components:   Sodium 130 (*)    Potassium 3.3 (*)    Glucose, Bld 105 (*)    Total Protein 8.6 (*)    Total Bilirubin 1.4 (*)    All other components within normal limits  URINALYSIS, ROUTINE W REFLEX MICROSCOPIC - Abnormal; Notable for the following components:   Hgb urine dipstick SMALL (*)    Ketones, ur 5 (*)    Protein, ur 30 (*)    Leukocytes,Ua SMALL (*)    Bacteria, UA RARE (*)    All other components within normal limits  RESPIRATORY PANEL BY RT PCR (FLU A&B, COVID)  URINE CULTURE     EKG None  Radiology DG Chest Portable 1 View  Result Date: 10/09/2019 CLINICAL DATA:  Generalized body aches and fever for several days, fever to 103 degrees at home last night, history hypertension, smoker, coronary artery disease EXAM: PORTABLE CHEST 1 VIEW COMPARISON:  Portable exam 1249 hours compared to 07/16/2012 FINDINGS: Normal heart size, mediastinal contours, and pulmonary vascularity. Lungs clear. No pulmonary infiltrate, pleural effusion or pneumothorax. Osseous structures unremarkable. IMPRESSION: No  acute abnormalities. Electronically Signed   By: Ulyses Southward M.D.   On: 10/09/2019 13:07    Procedures Procedures (including critical care time)  Medications Ordered in ED Medications  acetaminophen (TYLENOL) tablet 650 mg (650 mg Oral Given 10/09/19 1243)  potassium chloride SA (KLOR-CON) CR tablet 20 mEq (20 mEq Oral Given 10/09/19 1523)    ED Course  I have reviewed the triage vital signs and the nursing notes.  Pertinent labs & imaging results that were available during my care of the patient were reviewed by me and considered in my medical decision making (see chart for details).    MDM Rules/Calculators/A&P                         Patient with complaints of fever, low back pain which progressed to overall body aches Presentation, she is alert, oriented, nontoxic-appearing, no acute distress.  Her vitals show fever 1.4, mild hypertension, however no evidence of hypotension, tachycardia, hypoxia.  Physical exam with bilateral CVA tenderness, no midline tenderness to the low back, neurovascularly intact.  Lung sounds clear, abdomen is soft and nontender.  DDx includes Covid, pyelonephritis, pneumonia, acute abdomen, infected renal stone   CBC without leukocytosis or any other acute abnormalities.  CMP with a sodium 130, potassium of 3.3 which was repleted here in the ED.  No other significant abnormalities, normal LFTs and renal function.  X-ray without acute evidence  of infection.  Covid test is negative.  UA with evidence of small leukocytes, rare bacteria, with a small amount of hemoglobin but no RBC's. No evidence of sepsis.  Given these findings, favoring pyelonephritis versus renal stone.  Fever treated here with Tylenol, patient's potassium was repleted and will send home with prescription for potassium.  Will send home with 10-day course of Keflex as per discussion with Dr. Freida Busman.  Return precautions discussed. She voiced understanding and is agreeable. At this stage in the ED course the patient has been adequately screened and stable for discharge.    Final Clinical Impression(s) / ED Diagnoses Final diagnoses:  Pyelonephritis    Rx / DC Orders ED Discharge Orders         Ordered    potassium chloride (KLOR-CON) 10 MEQ tablet  Daily        10/09/19 1516    cephALEXin (KEFLEX) 500 MG capsule  Every 6 hours        10/09/19 1516           Leone Brand 10/09/19 1535    Lorre Nick, MD 10/13/19 1352

## 2019-10-09 NOTE — Discharge Instructions (Addendum)
Thank you for letting us take care of you in the ER today  Your work-up today shows a UTI that has spread to your kidneys.  Please make sure to take the prescribed antibiotic as directed until finished.  Your potassium was also a little bit low today, please take the potassium supplements daily.  Your sodium was also a little bit low, and you may increase your salt intake.  Please return to the ER for any worsening fevers, pain, urinary symptoms etc.  Please follow-up with your primary care doctor.  You do not have a primary care doctor, please follow-up with [facility] whose contact information I have provided, this is a free clinic here in the [place] area.  Your urine was sent off for a culture where they will test the bacteria that is growing in your urine, if your antibiotic needs to be changed, you will receive a call.

## 2019-10-09 NOTE — ED Triage Notes (Signed)
Pt presents with c/o generalized body aches and fever. Pt reports her back started to hurt several days ago and now her whole body is aching. Pt reports fever of 103 at home last night.

## 2019-10-12 LAB — URINE CULTURE: Culture: 80000 — AB

## 2019-10-14 ENCOUNTER — Telehealth: Payer: Self-pay | Admitting: *Deleted

## 2019-10-14 NOTE — Telephone Encounter (Signed)
Post ED Visit - Positive Culture Follow-up  Culture report reviewed by antimicrobial stewardship pharmacist: [facility] []  , Pharm.D. []  Enzo Bi, Pharm.D., BCPS AQ-ID []  , Pharm.D., BCPS []  Celedonio Miyamoto, Pharm.D., BCPS []  [place], Garvin Fila.D., BCPS, AAHIVP []  , Pharm.D., BCPS, AAHIVP []  Georgina Pillion, PharmD, BCPS []  , PharmD, BCPS []  [place], PharmD, BCPS []  [address], PharmD []  , PharmD, BCPS []  Estella Husk, PharmD  Pharmacy Team []  Lysle Pearl, PharmD []  , PharmD []  Phillips Climes, PharmD []  , Rph []  Agapito Games) , PharmD []  Verlan Friends, PharmD []  , PharmD []  Mervyn Gay, PharmD []  , PharmD []  Vinnie Level, PharmD []  [facility], PharmD []  , PharmD []  Len Childs, PharmD   Positive urine culture Treated with Cephalexin, organism sensitive to the same and no further patient follow-up is required at this time. , PharmD  Greer Pickerel Talley 10/14/2019, 7:04 AM

## 2020-03-11 ENCOUNTER — Emergency Department (HOSPITAL_COMMUNITY): Payer: Worker's Compensation

## 2020-03-11 ENCOUNTER — Encounter (HOSPITAL_COMMUNITY): Payer: Self-pay

## 2020-03-11 ENCOUNTER — Emergency Department (HOSPITAL_COMMUNITY)
Admission: EM | Admit: 2020-03-11 | Discharge: 2020-03-11 | Disposition: A | Discharge status: Home / Self Care | Payer: Worker's Compensation | Attending: Emergency Medicine | Admitting: Emergency Medicine

## 2020-03-11 DIAGNOSIS — S60211A Contusion of right wrist, initial encounter: Secondary | ICD-10-CM | POA: Insufficient documentation

## 2020-03-11 DIAGNOSIS — I10 Essential (primary) hypertension: Secondary | ICD-10-CM | POA: Insufficient documentation

## 2020-03-11 DIAGNOSIS — Z79899 Other long term (current) drug therapy: Secondary | ICD-10-CM | POA: Insufficient documentation

## 2020-03-11 DIAGNOSIS — S300XXA Contusion of lower back and pelvis, initial encounter: Secondary | ICD-10-CM | POA: Insufficient documentation

## 2020-03-11 DIAGNOSIS — F17210 Nicotine dependence, cigarettes, uncomplicated: Secondary | ICD-10-CM | POA: Insufficient documentation

## 2020-03-11 DIAGNOSIS — S60212A Contusion of left wrist, initial encounter: Secondary | ICD-10-CM | POA: Insufficient documentation

## 2020-03-11 DIAGNOSIS — M545 Low back pain, unspecified: Secondary | ICD-10-CM | POA: Insufficient documentation

## 2020-03-11 DIAGNOSIS — S20222A Contusion of left back wall of thorax, initial encounter: Secondary | ICD-10-CM

## 2020-03-11 DIAGNOSIS — I2510 Atherosclerotic heart disease of native coronary artery without angina pectoris: Secondary | ICD-10-CM | POA: Insufficient documentation

## 2020-03-11 DIAGNOSIS — Y990 Civilian activity done for income or pay: Secondary | ICD-10-CM | POA: Insufficient documentation

## 2020-03-11 MED ORDER — ACETAMINOPHEN 325 MG PO TABS
650.0000 mg | ORAL_TABLET | Freq: Once | ORAL | Status: AC
  Administered 2020-03-11: 650 mg via ORAL
  Filled 2020-03-11: qty 2

## 2020-03-11 MED ORDER — OXYCODONE-ACETAMINOPHEN 5-325 MG PO TABS: 1.0000 | ORAL_TABLET | Freq: Four times a day (QID) | ORAL | 0 refills | Status: DC | PRN

## 2020-03-11 MED ORDER — OXYCODONE-ACETAMINOPHEN 5-325 MG PO TABS
1.0000 | ORAL_TABLET | Freq: Once | ORAL | Status: AC
  Administered 2020-03-11: 1 via ORAL
  Filled 2020-03-11: qty 1
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
## 2020-03-11 NOTE — ED Notes (Signed)
An After Visit Summary was printed and given to the patient. Discharge instructions given and no further questions at this time.  
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
## 2020-03-11 NOTE — Discharge Instructions (Signed)
Please rest ice and elevate both wrists.  Please keep the wrist brace on your right arm this will help with compression and with pain.  Please use Tylenol or ibuprofen for pain.  You may use 600 mg ibuprofen every 6 hours or 1000 mg of Tylenol every 6 hours.  You may choose to alternate between the 2.  This would be most effective.  Not to exceed 4 g of Tylenol within 24 hours.  Not to exceed 3200 mg ibuprofen 24 hours.  I have also prescribed you 4 tablets of Percocet to use as needed for pain particularly at nighttime.  Please drink plenty of water.

## 2020-03-19 ENCOUNTER — Other Ambulatory Visit: Payer: Self-pay

## 2020-03-19 ENCOUNTER — Emergency Department (HOSPITAL_COMMUNITY): Admission: EM | Admit: 2020-03-19 | Discharge: 2020-03-19 | Discharge status: Against Medical Advice | Payer: Self-pay

## 2020-05-03 ENCOUNTER — Ambulatory Visit (HOSPITAL_COMMUNITY)
Admission: EM | Admit: 2020-05-03 | Discharge: 2020-05-03 | Disposition: A | Discharge status: Home / Self Care | Payer: Self-pay | Attending: Family Medicine | Admitting: Family Medicine

## 2020-05-03 ENCOUNTER — Other Ambulatory Visit: Payer: Self-pay

## 2020-05-03 ENCOUNTER — Encounter (HOSPITAL_COMMUNITY): Payer: Self-pay

## 2020-05-03 DIAGNOSIS — J3089 Other allergic rhinitis: Secondary | ICD-10-CM

## 2020-05-03 DIAGNOSIS — R059 Cough, unspecified: Secondary | ICD-10-CM

## 2020-05-03 MED ORDER — CETIRIZINE HCL 10 MG PO TABS: 10.0000 mg | ORAL_TABLET | Freq: Every day | ORAL | 2 refills | Status: DC

## 2020-05-03 MED ORDER — FLUTICASONE PROPIONATE 50 MCG/ACT NA SUSP: 1.0000 | Freq: Two times a day (BID) | NASAL | 2 refills | Status: DC

## 2020-05-03 MED ORDER — DEXAMETHASONE SODIUM PHOSPHATE 10 MG/ML IJ SOLN
INTRAMUSCULAR | Status: AC
  Filled 2020-05-03: qty 1

## 2020-05-03 MED ORDER — DEXAMETHASONE SODIUM PHOSPHATE 10 MG/ML IJ SOLN
10.0000 mg | Freq: Once | INTRAMUSCULAR | Status: AC
  Administered 2020-05-03: 10 mg via INTRAMUSCULAR

## 2020-05-03 NOTE — ED Triage Notes (Signed)
Pt is here with a cough that started 2 months ago, pt does NOT have a PCP but pt has taken OTC meds to relieve discomfort.

## 2020-05-03 NOTE — ED Provider Notes (Signed)
[facility]    CSN: [id number] Arrival date & time: 05/03/20  1713      History   Chief Complaint Chief Complaint  Patient presents with  . Cough    HPI Tracy Mercado is a 60 y.o. female.   Patient presenting today with 24-month history of hacking, productive cough, scratchy throat, hoarseness, sinus pressure, ear pain and pressure, congestion.  States symptoms are much worse when she is working around dust or pollen.  Denies chest pain, shortness of breath, wheezing, fever, chills, nausea vomiting diarrhea, body aches, new sick contacts.  Tried some Zyrtec a few times and has tried numerous cough syrups over-the-counter with minimal relief.  Known history of seasonal allergies not on any ongoing regimen.     Past Medical History:  Diagnosis Date  . CAD (coronary artery disease)   . Hypertension     There are no problems to display for this patient.   Past Surgical History:  Procedure Laterality Date  . KNEE ARTHROSCOPY      OB History   No obstetric history on file.      Home Medications    Prior to Admission medications   Medication Sig Start Date End Date Taking? Authorizing Provider  cetirizine (ZYRTEC) 10 MG tablet Take 1 tablet (10 mg total) by mouth daily. 05/03/20   Particia Nearing, PA-C  diphenhydrAMINE (ALKA-SELTZER PLUS ALLERGY) 25 MG tablet Take 25 mg by mouth every 6 (six) hours as needed for allergies.    [provider]  famotidine (PEPCID) 20 MG tablet Take 1 tablet (20 mg total) by mouth 2 (two) times daily. Patient not taking: No sig reported 06/18/19   Domenick Gong, MD  fluticasone [facility]) 50 MCG/ACT nasal spray Place 1 spray into both nostrils in the morning and at bedtime. 05/03/20   Particia Nearing, PA-C  lisinopril (PRINIVIL,ZESTRIL) 20 MG tablet Take 1 tablet (20 mg total) by mouth daily. Patient not taking: No sig reported 06/02/12   Charlynne Pander, MD  Misc. Devices (WRIST BRACE) MISC APPLY LEFT  AND RIGHT WRIST BRACE TO BILATERAL WRIST/FOREARM. FOR STABILITY AND SUPPORT. TO BE FITTED BY MEDICAL SUPPLY. 12/04/16   Lizbeth Bark, FNP  oxyCODONE-acetaminophen (PERCOCET/ROXICET) 5-325 MG tablet Take 1 tablet by mouth every 6 (six) hours as needed for severe pain. 03/11/20   Gailen Shelter, PA  pantoprazole (PROTONIX) 20 MG tablet Take 1 tablet (20 mg total) by mouth daily. Patient not taking: No sig reported 06/18/19   Domenick Gong, MD  potassium chloride (KLOR-CON) 10 MEQ tablet Take 1 tablet (10 mEq total) by mouth daily. 10/09/19 11/08/19  Mare Ferrari, PA-C  sucralfate (CARAFATE) 1 GM/10ML suspension Take 10 mLs (1 g total) by mouth 4 (four) times daily. 10 mL before meals and before bedtime Patient not taking: No sig reported 06/18/19   Domenick Gong, MD    Family History Family History  Family history unknown: Yes    Social History Social History   Tobacco Use  . Smoking status: Current Every Day Smoker    Packs/day: 0.50    Types: Cigarettes  . Smokeless tobacco: Never Used  Substance Use Topics  . Alcohol use: Yes  . Drug use: No     Allergies   Patient has no known allergies.   Review of Systems Review of Systems Per HPI  Physical Exam Triage Vital Signs ED Triage Vitals  Enc Vitals Group     BP 05/03/20 1807 127/84  Pulse Rate 05/03/20 1807 72     Resp 05/03/20 1807 17     Temp 05/03/20 1807 97.8 F (36.6 C)     Temp Source 05/03/20 1807 Oral     SpO2 05/03/20 1807 95 %     Weight --      Height --      Head Circumference --      Peak Flow --      Pain Score 05/03/20 1804 0     Pain Loc --      Pain Edu? --      Excl. in GC? --    No data found.  Updated Vital Signs BP 127/84 (BP Location: Right Arm)   Pulse 72   Temp 97.8 F (36.6 C) (Oral)   Resp 17   SpO2 95%   Visual Acuity Right Eye Distance:   Left Eye Distance:   Bilateral Distance:    Right Eye Near:   Left Eye Near:    Bilateral Near:     Physical  Exam Vitals and nursing note reviewed.  Constitutional:      Appearance: Normal appearance. She is not ill-appearing.  HENT:     Head: Atraumatic.     Ears:     Comments: Bilateral middle ear effusions    Nose: Rhinorrhea present.     Comments: Significant edema of bilateral nasal turbinates    Mouth/Throat:     Mouth: Mucous membranes are moist.     Pharynx: Posterior oropharyngeal erythema present. No oropharyngeal exudate.  Eyes:     Extraocular Movements: Extraocular movements intact.     Conjunctiva/sclera: Conjunctivae normal.  Cardiovascular:     Rate and Rhythm: Normal rate and regular rhythm.     Heart sounds: Normal heart sounds.  Pulmonary:     Effort: Pulmonary effort is normal.     Breath sounds: Normal breath sounds. No wheezing or rales.  Abdominal:     General: Bowel sounds are normal. There is no distension.     Palpations: Abdomen is soft.     Tenderness: There is no abdominal tenderness. There is no guarding.  Musculoskeletal:        General: Normal range of motion.     Cervical back: Normal range of motion and neck supple.  Skin:    General: Skin is warm and dry.  Neurological:     Mental Status: She is alert and oriented to person, place, and time.  Psychiatric:        Mood and Affect: Mood normal.        Thought Content: Thought content normal.        Judgment: Judgment normal.      UC Treatments / Results  Labs (all labs ordered are listed, but only abnormal results are displayed) Labs Reviewed - No data to display  EKG   Radiology No results found.  Procedures Procedures (including critical care time)  Medications Ordered in UC Medications  dexamethasone (DECADRON) injection 10 mg (10 mg Intramuscular Given 05/03/20 1859)    Initial Impression / Assessment and Plan / UC Course  I have reviewed the triage vital signs and the nursing notes.  Pertinent labs & imaging results that were available during my care of the patient were  reviewed by me and considered in my medical decision making (see chart for details).     Consistent with uncontrolled seasonal allergies.  Treat with IM Decadron, start Zyrtec and Flonase regimen daily.  Discussed supportive home care and  return precautions.  Work note given.  Final Clinical Impressions(s) / UC Diagnoses   Final diagnoses:  Seasonal allergic rhinitis due to other allergic trigger  Cough   Discharge Instructions   None    ED Prescriptions    Medication Sig Dispense Auth. Provider   cetirizine (ZYRTEC) 10 MG tablet  (Status: Discontinued) Take 1 tablet (10 mg total) by mouth daily. 30 tablet Particia Nearing, PA-C   fluticasone [facility]) 50 MCG/ACT nasal spray  (Status: Discontinued) Place 1 spray into both nostrils in the morning and at bedtime. 16 g Particia Nearing, [place]   cetirizine (ZYRTEC) 10 MG tablet Take 1 tablet (10 mg total) by mouth daily. 30 tablet Particia Nearing, PA-C   fluticasone [facility]) 50 MCG/ACT nasal spray Place 1 spray into both nostrils in the morning and at bedtime. 16 g Particia Nearing, [place]     PDMP not reviewed this encounter.   Particia Nearing, [place] 05/03/20 Windell Moment

## 2020-07-31 ENCOUNTER — Emergency Department (HOSPITAL_BASED_OUTPATIENT_CLINIC_OR_DEPARTMENT_OTHER): Payer: Self-pay

## 2020-07-31 ENCOUNTER — Other Ambulatory Visit: Payer: Self-pay

## 2020-07-31 ENCOUNTER — Emergency Department (HOSPITAL_BASED_OUTPATIENT_CLINIC_OR_DEPARTMENT_OTHER)
Admission: EM | Admit: 2020-07-31 | Discharge: 2020-07-31 | Disposition: A | Discharge status: Home / Self Care | Payer: Self-pay | Attending: Emergency Medicine | Admitting: Emergency Medicine

## 2020-07-31 ENCOUNTER — Encounter (HOSPITAL_BASED_OUTPATIENT_CLINIC_OR_DEPARTMENT_OTHER): Payer: Self-pay | Admitting: Emergency Medicine

## 2020-07-31 DIAGNOSIS — Z5321 Procedure and treatment not carried out due to patient leaving prior to being seen by health care provider: Secondary | ICD-10-CM | POA: Insufficient documentation

## 2020-07-31 DIAGNOSIS — M79604 Pain in right leg: Secondary | ICD-10-CM | POA: Insufficient documentation

## 2020-07-31 DIAGNOSIS — W19XXXA Unspecified fall, initial encounter: Secondary | ICD-10-CM | POA: Insufficient documentation

## 2020-07-31 NOTE — ED Triage Notes (Signed)
Pt presents to ED Pov. Pt c/o R leg. Pt reports that pain began ya couple days after fall. Unknown head trauma, no LOC, no blood thinners. Done OTC meds and ice w/o relief

## 2020-11-16 ENCOUNTER — Encounter (HOSPITAL_COMMUNITY): Payer: Self-pay | Admitting: Emergency Medicine

## 2020-11-16 ENCOUNTER — Ambulatory Visit (INDEPENDENT_AMBULATORY_CARE_PROVIDER_SITE_OTHER): Payer: Self-pay

## 2020-11-16 ENCOUNTER — Other Ambulatory Visit: Payer: Self-pay

## 2020-11-16 ENCOUNTER — Ambulatory Visit (HOSPITAL_COMMUNITY)
Admission: EM | Admit: 2020-11-16 | Discharge: 2020-11-16 | Disposition: A | Discharge status: Home / Self Care | Payer: Self-pay | Attending: Family Medicine | Admitting: Family Medicine

## 2020-11-16 DIAGNOSIS — M25561 Pain in right knee: Secondary | ICD-10-CM

## 2020-11-16 MED ORDER — PREDNISONE 20 MG PO TABS: 40.0000 mg | ORAL_TABLET | Freq: Every day | ORAL | 0 refills | Status: DC

## 2020-11-16 MED ORDER — HYDROCODONE-ACETAMINOPHEN 5-325 MG PO TABS: 1.0000 | ORAL_TABLET | Freq: Four times a day (QID) | ORAL | 0 refills | Status: DC | PRN

## 2020-11-16 NOTE — Discharge Instructions (Signed)

## 2020-11-16 NOTE — ED Triage Notes (Addendum)
Patient reports 3 week history of right knee pain.  No known injury.  Knee is swelling sometimes.  Patient has had surgery on this knee in the past.  Has taken [facility] arthritis and Tylenol Arthritis with no improvement.  Pain is stabbing in back of right knee and shooting into right buttocks and down into right lower leg.  Patient does report a fall 2-3 months ago

## 2020-11-16 NOTE — ED Provider Notes (Signed)
[facility]   [id number] 11/16/20 Arrival Time: 1443  ASSESSMENT & PLAN:  1. Acute pain of right knee    I have personally viewed the imaging studies ordered this visit. No acute changes. Degenerative changes.  Meds ordered this encounter  Medications   predniSONE (DELTASONE) 20 MG tablet    Sig: Take 2 tablets (40 mg total) by mouth daily.    Dispense:  10 tablet    Refill:  0   HYDROcodone-acetaminophen (NORCO/VICODIN) 5-325 MG tablet    Sig: Take 1 tablet by mouth every 6 (six) hours as needed for moderate pain or severe pain.    Dispense:  8 tablet    Refill:  0    Orders Placed This Encounter  Procedures   DG Knee Complete 4 Views Right    Recommend:  Follow-up Information     Schedule an appointment as soon as possible for a visit  with [facility].   Contact information: [address] [place] [address] [phone]                 Discharge Instructions      Be aware, you have been prescribed pain medications that may cause drowsiness. While taking this medication, do not take any other medications containing acetaminophen (Tylenol). Do not combine with alcohol or other illicit drugs. Please do not drive, operate heavy machinery, or take part in activities that require making important decisions while on this medication as your judgement may be clouded.     [place] Controlled Substances Registry consulted for this patient. I feel the risk/benefit ratio today is favorable for proceeding with this prescription for a controlled substance. Medication sedation precautions given.  Reviewed expectations re: course of current medical issues. Questions answered. Outlined signs and symptoms indicating need for more acute intervention. Patient verbalized understanding. After Visit Summary given.  SUBJECTIVE: History from: patient. Tracy Mercado is a 60 y.o. female who reports acute on chronic  R knee pain; grad onset; x 2-3 w; no trauma; now swollen. Pain affects sleep. Stands all day at work. No extremity sensation changes or weakness. Tylenol not helping. Afebrile.  Past Surgical History:  Procedure Laterality Date   KNEE ARTHROSCOPY        OBJECTIVE:  Vitals:   11/16/20 1604  BP: 126/78  Pulse: 71  Resp: 20  Temp: 97.8 F (36.6 C)  TempSrc: Oral  SpO2: 97%    General appearance: alert; no distress HEENT: [place]; AT Neck: supple with FROM Resp: unlabored respirations Extremities: RLE: warm with well perfused appearance; swollen with effusion; diffuse tenderness; FROM Skin: warm and dry; no visible rashes Neurologic: gait normal; normal sensation and strength of bilateral LE Psychological: alert and cooperative; normal mood and affect  Imaging: DG Knee Complete 4 Views Right  Result Date: 11/16/2020 CLINICAL DATA:  Pain EXAM: RIGHT KNEE - COMPLETE 4+ VIEW COMPARISON:  None. FINDINGS: No recent fracture or dislocation is seen. Degenerative changes are noted with joint space narrowing and large bony spurs in the medial compartment. Small bony spurs seen in the lateral and patellofemoral compartments. There is no significant effusion. IMPRESSION: No recent fracture or dislocation is seen. Degenerative changes are noted in the right knee, more severe in the medial compartment. Electronically Signed   By: Ernie Avena M.D.   On: 11/16/2020 16:28      No Known Allergies  Past Medical History:  Diagnosis Date   CAD (coronary artery disease)  Hypertension    Social History   Socioeconomic History   Marital status: Single    Spouse name: Not on file   Number of children: Not on file   Years of education: Not on file   Highest education level: Not on file  Occupational History   Not on file  Tobacco Use   Smoking status: Every Day    Packs/day: 0.50    Types: Cigarettes   Smokeless tobacco: Never  Vaping Use   Vaping Use: Never used  Substance and  Sexual Activity   Alcohol use: Yes   Drug use: No   Sexual activity: Not Currently  Other Topics Concern   Not on file  Social History Narrative   Not on file   Social Determinants of Health   Financial Resource Strain: Not on file  Food Insecurity: Not on file  Transportation Needs: Not on file  Physical Activity: Not on file  Stress: Not on file  Social Connections: Not on file   Family History  Problem Relation Age of Onset   Stroke Mother    Healthy Father    Past Surgical History:  Procedure Laterality Date   KNEE ARTHROSCOPY         Mardella Layman, MD 11/16/20 1750

## 2020-12-03 ENCOUNTER — Encounter (HOSPITAL_BASED_OUTPATIENT_CLINIC_OR_DEPARTMENT_OTHER): Payer: Self-pay | Admitting: *Deleted

## 2020-12-03 ENCOUNTER — Emergency Department (HOSPITAL_BASED_OUTPATIENT_CLINIC_OR_DEPARTMENT_OTHER): Payer: Self-pay | Admitting: Radiology

## 2020-12-03 ENCOUNTER — Other Ambulatory Visit: Payer: Self-pay

## 2020-12-03 ENCOUNTER — Emergency Department (HOSPITAL_BASED_OUTPATIENT_CLINIC_OR_DEPARTMENT_OTHER)
Admission: EM | Admit: 2020-12-03 | Discharge: 2020-12-04 | Disposition: A | Discharge status: Home / Self Care | Payer: Self-pay | Attending: Emergency Medicine | Admitting: Emergency Medicine

## 2020-12-03 ENCOUNTER — Emergency Department (HOSPITAL_BASED_OUTPATIENT_CLINIC_OR_DEPARTMENT_OTHER): Payer: Self-pay

## 2020-12-03 DIAGNOSIS — M79604 Pain in right leg: Secondary | ICD-10-CM | POA: Insufficient documentation

## 2020-12-03 DIAGNOSIS — D696 Thrombocytopenia, unspecified: Secondary | ICD-10-CM | POA: Insufficient documentation

## 2020-12-03 DIAGNOSIS — I119 Hypertensive heart disease without heart failure: Secondary | ICD-10-CM | POA: Insufficient documentation

## 2020-12-03 DIAGNOSIS — F17210 Nicotine dependence, cigarettes, uncomplicated: Secondary | ICD-10-CM | POA: Insufficient documentation

## 2020-12-03 DIAGNOSIS — I2510 Atherosclerotic heart disease of native coronary artery without angina pectoris: Secondary | ICD-10-CM | POA: Insufficient documentation

## 2020-12-03 DIAGNOSIS — M25561 Pain in right knee: Secondary | ICD-10-CM | POA: Insufficient documentation

## 2020-12-03 LAB — CBC WITH DIFFERENTIAL/PLATELET
Abs Immature Granulocytes: 0.01 10*3/uL (ref 0.00–0.07)
Basophils Absolute: 0 10*3/uL (ref 0.0–0.1)
Basophils Relative: 1 %
Eosinophils Absolute: 0 10*3/uL (ref 0.0–0.5)
Eosinophils Relative: 1 %
HCT: 38 % (ref 36.0–46.0)
Hemoglobin: 13 g/dL (ref 12.0–15.0)
Immature Granulocytes: 0 %
Lymphocytes Relative: 56 %
Lymphs Abs: 2.8 10*3/uL (ref 0.7–4.0)
MCH: 34.8 pg — ABNORMAL HIGH (ref 26.0–34.0)
MCHC: 34.2 g/dL (ref 30.0–36.0)
MCV: 101.6 fL — ABNORMAL HIGH (ref 80.0–100.0)
Monocytes Absolute: 0.7 10*3/uL (ref 0.1–1.0)
Monocytes Relative: 14 %
Neutro Abs: 1.4 10*3/uL — ABNORMAL LOW (ref 1.7–7.7)
Neutrophils Relative %: 28 %
Platelets: 128 10*3/uL — ABNORMAL LOW (ref 150–400)
RBC: 3.74 MIL/uL — ABNORMAL LOW (ref 3.87–5.11)
RDW: 13.2 % (ref 11.5–15.5)
WBC: 5 10*3/uL (ref 4.0–10.5)
nRBC: 0 % (ref 0.0–0.2)

## 2020-12-03 LAB — COMPREHENSIVE METABOLIC PANEL
ALT: 38 U/L (ref 0–44)
AST: 71 U/L — ABNORMAL HIGH (ref 15–41)
Albumin: 3.9 g/dL (ref 3.5–5.0)
Alkaline Phosphatase: 88 U/L (ref 38–126)
Anion gap: 10 (ref 5–15)
BUN: 5 mg/dL — ABNORMAL LOW (ref 8–23)
CO2: 25 mmol/L (ref 22–32)
Calcium: 10 mg/dL (ref 8.9–10.3)
Chloride: 105 mmol/L (ref 98–111)
Creatinine, Ser: 0.59 mg/dL (ref 0.44–1.00)
GFR, Estimated: >60 mL/min (ref >60)
Glucose, Bld: 95 mg/dL (ref 70–99)
Potassium: 3.7 mmol/L (ref 3.5–5.1)
Sodium: 140 mmol/L (ref 135–145)
Total Bilirubin: 0.6 mg/dL (ref 0.3–1.2)
Total Protein: 8.1 g/dL (ref 6.5–8.1)

## 2020-12-03 LAB — SEDIMENTATION RATE: Sed Rate: 13 mm/hr (ref 0–22)

## 2020-12-03 MED ORDER — FENTANYL CITRATE PF 50 MCG/ML IJ SOSY
50.0000 ug | PREFILLED_SYRINGE | Freq: Once | INTRAMUSCULAR | Status: AC
  Administered 2020-12-03: 50 ug via INTRAVENOUS
  Filled 2020-12-03: qty 1

## 2020-12-03 MED ORDER — LIDOCAINE HCL (PF) 1 % IJ SOLN
5.0000 mL | Freq: Once | INTRAMUSCULAR | Status: AC
  Administered 2020-12-03: 5 mL
  Filled 2020-12-03: qty 5

## 2020-12-03 MED ORDER — KETOROLAC TROMETHAMINE 15 MG/ML IJ SOLN
15.0000 mg | Freq: Once | INTRAMUSCULAR | Status: AC
  Administered 2020-12-03: 15 mg via INTRAVENOUS
  Filled 2020-12-03: qty 1

## 2020-12-03 MED ORDER — LIDOCAINE-EPINEPHRINE 2 %-1:100000 IJ SOLN: 10.0000 mL | Freq: Once | INTRAMUSCULAR | Status: DC

## 2020-12-03 MED ORDER — LIDOCAINE-EPINEPHRINE (PF) 2 %-1:200000 IJ SOLN
10.0000 mL | Freq: Once | INTRAMUSCULAR | Status: AC
  Administered 2020-12-03: 10 mL via INTRADERMAL
  Filled 2020-12-03: qty 20

## 2020-12-03 NOTE — ED Provider Notes (Signed)
[facility] Provider Note   CSN: [id number] Arrival date & time: 12/03/20  1747     History Chief Complaint  Patient presents with   Leg Pain    Tracy Mercado is a 60 y.o. female.   Leg Pain Associated symptoms: no back pain and no fever    60 year old female with medical history significant for HTN and CAD who [facility] with 2 weeks of right knee pain and swelling that is worsened over the past few days.  She states that she has a history of osteoarthritis in the knee and has had knee arthroscopy in the past.  She states that she has had worsening knee swelling and pain that is sharp, shooting, moderate in intensity, worse with movement, better with rest that has progressed over the last 2 weeks slowly.  She states that it is rapidly progressed over the past 2 days and now she is having some more pain with range of motion.  Denies any fevers or chills, redness in her leg.  Past Medical History:  Diagnosis Date   CAD (coronary artery disease)    Hypertension     There are no problems to display for this patient.   Past Surgical History:  Procedure Laterality Date   HAND SURGERY Left    KNEE ARTHROSCOPY       OB History   No obstetric history on file.     Family History  Problem Relation Age of Onset   Stroke Mother    Healthy Father     Social History   Tobacco Use   Smoking status: Every Day    Packs/day: 0.50    Types: Cigarettes   Smokeless tobacco: Never  Vaping Use   Vaping Use: Never used  Substance Use Topics   Alcohol use: Yes   Drug use: No    Home Medications Prior to Admission medications   Medication Sig Start Date End Date Taking? Authorizing Provider  HYDROcodone-acetaminophen (NORCO) 5-325 MG tablet Take 1 tablet by mouth every 6 (six) hours as needed for severe pain. 12/04/20  Yes Molpus, Jenny Reichmann, MD  Misc. Devices (WRIST BRACE) MISC APPLY LEFT AND RIGHT WRIST BRACE TO BILATERAL  WRIST/FOREARM. FOR STABILITY AND SUPPORT. TO BE FITTED BY MEDICAL SUPPLY. 12/04/16   Alfonse Spruce, FNP  predniSONE (DELTASONE) 20 MG tablet Take 2 tablets (40 mg total) by mouth daily. 11/16/20   Vanessa Kick, MD    Allergies    Patient has no known allergies.  Review of Systems   Review of Systems  Constitutional:  Negative for chills and fever.  HENT:  Negative for ear pain and sore throat.   Eyes:  Negative for pain and visual disturbance.  Respiratory:  Negative for cough and shortness of breath.   Cardiovascular:  Negative for chest pain and palpitations.  Gastrointestinal:  Negative for abdominal pain and vomiting.  Genitourinary:  Negative for dysuria and hematuria.  Musculoskeletal:  Positive for arthralgias and joint swelling. Negative for back pain.  Skin:  Negative for color change and rash.  Neurological:  Negative for seizures and syncope.  All other systems reviewed and are negative.  Physical Exam Updated Vital Signs BP 136/82   Pulse 86   Temp 98.8 F (37.1 C)   Resp 18   Ht 5\' 6"  (1.676 m)   Wt 77.1 kg   SpO2 97%   BMI 27.44 kg/m   Physical Exam Vitals and nursing note reviewed.  Constitutional:  General: She is not in acute distress.    Appearance: She is well-developed.  HENT:     Head: Normocephalic and atraumatic.  Eyes:     Conjunctiva/sclera: Conjunctivae normal.     Pupils: Pupils are equal, round, and reactive to light.  Cardiovascular:     Rate and Rhythm: Normal rate and regular rhythm.     Heart sounds: No murmur heard. Pulmonary:     Effort: Pulmonary effort is normal. No respiratory distress.     Breath sounds: Normal breath sounds.  Abdominal:     General: There is no distension.     Palpations: Abdomen is soft.     Tenderness: There is no abdominal tenderness. There is no guarding.  Musculoskeletal:        General: Swelling and tenderness present. No deformity or signs of injury.     Cervical back: Neck supple.      Comments: Right knee with bilateral joint swelling, tenderness palpation, pain with range of motion.  Distally neurovascular intact.  Skin:    General: Skin is warm and dry.     Capillary Refill: Capillary refill takes less than 2 seconds.     Findings: No lesion or rash.  Neurological:     General: No focal deficit present.     Mental Status: She is alert. Mental status is at baseline.  Psychiatric:        Mood and Affect: Mood normal.    ED Results / Procedures / Treatments   Labs (all labs ordered are listed, but only abnormal results are displayed) Labs Reviewed  CBC WITH DIFFERENTIAL/PLATELET - Abnormal; Notable for the following components:      Result Value   RBC 3.74 (*)    MCV 101.6 (*)    MCH 34.8 (*)    Platelets 128 (*)    Neutro Abs 1.4 (*)    All other components within normal limits  COMPREHENSIVE METABOLIC PANEL - Abnormal; Notable for the following components:   BUN 5 (*)    AST 71 (*)    All other components within normal limits  SYNOVIAL CELL COUNT + DIFF, W/ CRYSTALS - Abnormal; Notable for the following components:   Appearance-Synovial HAZY (*)    All other components within normal limits  GRAM STAIN  CULTURE, BODY FLUID W GRAM STAIN -BOTTLE  SEDIMENTATION RATE  C-REACTIVE PROTEIN  GLUCOSE, BODY FLUID OTHER            PROTEIN, BODY FLUID (OTHER)  URIC ACID, BODY FLUID    EKG None  Radiology DG Knee 2 Views Right  Result Date: 12/03/2020 CLINICAL DATA:  Right knee pain and swelling. EXAM: RIGHT KNEE - 1-2 VIEW COMPARISON:  Study of 11/16/2020. FINDINGS: Again noted are tricompartmental changes of degenerative arthrosis, with joint space loss greatest in the medial femorotibial joint where it is bone-on-bone, moderate bilateral femorotibial marginal osteophytes greater medially and small patellofemoral spurs. There is osteopenia without evidence of fractures. A moderate to large sized suprapatellar bursal effusion is present and is increased in size  since the prior study, but in all other respects no further changes are seen. Soft tissues are otherwise unremarkable. IMPRESSION: Osteopenia and nonerosive DJD changes, with increasingly prominent suprapatellar bursal effusion. There is no destructive bone lesion or displaced fractures. Electronically Signed   By: Almira Bar M.D.   On: 12/03/2020 20:46   US Venous Img Lower Right (DVT Study)  Result Date: 12/03/2020 CLINICAL DATA:  Right knee pain/swelling x2 weeks EXAM: RIGHT  LOWER EXTREMITY VENOUS DOPPLER ULTRASOUND TECHNIQUE: Gray-scale sonography with compression, as well as color and duplex ultrasound, were performed to evaluate the deep venous system(s) from the level of the common femoral vein through the popliteal and proximal calf veins. COMPARISON:  None. FINDINGS: VENOUS Normal compressibility of the common femoral, superficial femoral, and popliteal veins, as well as the visualized calf veins. Visualized portions of profunda femoral vein and great saphenous vein unremarkable. No filling defects to suggest DVT on grayscale or color Doppler imaging. Doppler waveforms show normal direction of venous flow, normal respiratory plasticity and response to augmentation. Limited views of the contralateral common femoral vein are unremarkable. OTHER 3.7 x 1.9 x 2.3 cm popliteal fossa cyst.  Right knee effusion. Limitations: none IMPRESSION: No evidence of DVT. Right knee effusion. Popliteal fossa cyst. Electronically Signed   By: Julian Hy M.D.   On: 12/03/2020 19:47    Procedures .Joint Aspiration/Arthrocentesis  Date/Time: 12/03/2020 11:44 PM Performed by: Regan Lemming, MD Authorized by: Regan Lemming, MD   Consent:    Consent obtained:  Verbal   Consent given by:  Patient   Risks discussed:  Bleeding, infection and pain   Alternatives discussed:  No treatment Universal protocol:    Patient identity confirmed:  Verbally with patient Location:    Location:  Knee   Knee:  R  knee Anesthesia:    Anesthesia method:  Local infiltration   Local anesthetic:  Lidocaine 1% WITH epi Procedure details:    Needle gauge:  18 G   Ultrasound guidance: yes     Approach:  Medial   Aspirate amount:  40cc   Aspirate characteristics:  Yellow   Steroid injected: no     Specimen collected: yes   Post-procedure details:    Procedure completion:  Tolerated   Medications Ordered in ED Medications  lidocaine (PF) (XYLOCAINE) 1 % injection 5 mL (5 mLs Infiltration Given 12/03/20 2048)  fentaNYL (SUBLIMAZE) injection 50 mcg (50 mcg Intravenous Given 12/03/20 2118)  lidocaine-EPINEPHrine (XYLOCAINE W/EPI) 2 %-1:200000 (PF) injection 10 mL (10 mLs Intradermal Given 12/03/20 2146)  ketorolac (TORADOL) 15 MG/ML injection 15 mg (15 mg Intravenous Given 12/03/20 2231)  fentaNYL (SUBLIMAZE) injection 50 mcg (50 mcg Intravenous Given 12/04/20 0404)    ED Course  I have reviewed the triage vital signs and the nursing notes.  Pertinent labs & imaging results that were available during my care of the patient were reviewed by me and considered in my medical decision making (see chart for details).    MDM Rules/Calculators/A&P                           60 year old female with medical history significant for HTN and CAD who [facility] with 2 weeks of right knee pain and swelling that is worsened over the past few days.  She states that she has a history of osteoarthritis in the knee and has had knee arthroscopy in the past.  She states that she has had worsening knee swelling and pain that is sharp, shooting, moderate in intensity, worse with movement, better with rest that has progressed over the last 2 weeks slowly.  She states that it is rapidly progressed over the past 2 days and now she is having some more pain with range of motion.  Denies any fevers or chills, redness in her leg.  On arrival, the patient was stable, afebrile, hemodynamically stable.  Physical exam as  above  concerning for right knee swelling with tenderness palpation bilaterally and some pain with range of motion.  Differential diagnosis includes osteoarthritis, gout, septic arthritis.  Additionally considered bursitis.  Arthrocentesis was performed of the knee with aspiration successfully on the second attempt of yellow fluid.  Synovial fluid studies sent for analysis.  An x-ray of the right knee was performed which revealed fluid in the prepatellar bursa, venous ultrasound was performed which revealed no bed evidence of DVT. She was administered fentanyl and Toradol for pain control with some improvement.  Laboratory work-up significant for CBC without a leukocytosis or anemia, mild thrombocytopenia 128, CMP without significant electrolyte abnormality, normal renal and liver function.   Plan at time of signout to follow-up cytology of the patient's fluid.  Signout given to Dr. Florina Ou at 0100.  Final Clinical Impression(s) / ED Diagnoses Final diagnoses:  Acute pain of right knee    Rx / DC Orders ED Discharge Orders          Ordered    HYDROcodone-acetaminophen (NORCO) 5-325 MG tablet  Every 6 hours PRN        12/04/20 0402             Regan Lemming, MD 12/04/20 2021

## 2020-12-03 NOTE — ED Notes (Signed)
Pt not in room. Pt in Venous doppler US.

## 2020-12-03 NOTE — ED Triage Notes (Signed)
Pt comes in with 2 weeks of rt knee pain and swelling that has moved up her leg to thigh and vaginal area and buttock.

## 2020-12-04 LAB — SYNOVIAL CELL COUNT + DIFF, W/ CRYSTALS
Crystals, Fluid: NONE SEEN
Eosinophils-Synovial: 0 % (ref 0–1)
Lymphocytes-Synovial Fld: 15 % (ref 0–20)
Monocyte-Macrophage-Synovial Fluid: 72 % (ref 50–90)
Neutrophil, Synovial: 13 % (ref 0–25)
WBC, Synovial: 105 /mm3 (ref 0–200)

## 2020-12-04 LAB — GRAM STAIN

## 2020-12-04 LAB — C-REACTIVE PROTEIN: CRP: <0.5 mg/dL (ref <1.0)

## 2020-12-04 MED ORDER — FENTANYL CITRATE PF 50 MCG/ML IJ SOSY
50.0000 ug | PREFILLED_SYRINGE | Freq: Once | INTRAMUSCULAR | Status: AC
  Administered 2020-12-04: 50 ug via INTRAVENOUS
  Filled 2020-12-04: qty 1

## 2020-12-04 MED ORDER — HYDROCODONE-ACETAMINOPHEN 5-325 MG PO TABS: 1.0000 | ORAL_TABLET | Freq: Four times a day (QID) | ORAL | 0 refills | Status: DC | PRN

## 2020-12-04 NOTE — ED Provider Notes (Signed)
Nursing notes and vitals signs, including pulse oximetry, reviewed.  Summary of this visit's results, reviewed by myself:  EKG:  EKG Interpretation  Date/Time:    Ventricular Rate:    PR Interval:    QRS Duration:   QT Interval:    QTC Calculation:   R Axis:     Text Interpretation:          Labs:  Results for orders placed or performed during the hospital encounter of 12/03/20 (from the past 24 hour(s))  CBC with Differential     Status: Abnormal   Collection Time: 12/03/20  8:22 PM  Result Value Ref Range   WBC 5.0 4.0 - 10.5 K/uL   RBC 3.74 (L) 3.87 - 5.11 MIL/uL   Hemoglobin 13.0 12.0 - 15.0 g/dL   HCT 59.9 35.7 - 01.7 %   MCV 101.6 (H) 80.0 - 100.0 fL   MCH 34.8 (H) 26.0 - 34.0 pg   MCHC 34.2 30.0 - 36.0 g/dL   RDW 79.3 90.3 - 00.9 %   Platelets 128 (L) 150 - 400 K/uL   nRBC 0.0 0.0 - 0.2 %   Neutrophils Relative % 28 %   Neutro Abs 1.4 (L) 1.7 - 7.7 K/uL   Lymphocytes Relative 56 %   Lymphs Abs 2.8 0.7 - 4.0 K/uL   Monocytes Relative 14 %   Monocytes Absolute 0.7 0.1 - 1.0 K/uL   Eosinophils Relative 1 %   Eosinophils Absolute 0.0 0.0 - 0.5 K/uL   Basophils Relative 1 %   Basophils Absolute 0.0 0.0 - 0.1 K/uL   Immature Granulocytes 0 %   Abs Immature Granulocytes 0.01 0.00 - 0.07 K/uL  Comprehensive metabolic panel     Status: Abnormal   Collection Time: 12/03/20  8:22 PM  Result Value Ref Range   Sodium 140 135 - 145 mmol/L   Potassium 3.7 3.5 - 5.1 mmol/L   Chloride 105 98 - 111 mmol/L   CO2 25 22 - 32 mmol/L   Glucose, Bld 95 70 - 99 mg/dL   BUN 5 (L) 8 - 23 mg/dL   Creatinine, Ser 2.33 0.44 - 1.00 mg/dL   Calcium 00.7 8.9 - 62.2 mg/dL   Total Protein 8.1 6.5 - 8.1 g/dL   Albumin 3.9 3.5 - 5.0 g/dL   AST 71 (H) 15 - 41 U/L   ALT 38 0 - 44 U/L   Alkaline Phosphatase 88 38 - 126 U/L   Total Bilirubin 0.6 0.3 - 1.2 mg/dL   GFR, Estimated >63 >33 mL/min   Anion gap 10 5 - 15  Sedimentation rate     Status: None   Collection Time: 12/03/20  8:22  PM  Result Value Ref Range   Sed Rate 13 0 - 22 mm/hr  C-reactive protein     Status: None   Collection Time: 12/03/20  8:22 PM  Result Value Ref Range   CRP <0.5 <1.0 mg/dL  Synovial cell count + diff, w/ crystals     Status: Abnormal   Collection Time: 12/03/20 10:18 PM  Result Value Ref Range   Color, Synovial YELLOW YELLOW   Appearance-Synovial HAZY (A) CLEAR   Crystals, Fluid NO CRYSTALS SEEN    WBC, Synovial 105 0 - 200 /cu mm   Neutrophil, Synovial 13 0 - 25 %   Lymphocytes-Synovial Fld 15 0 - 20 %   Monocyte-Macrophage-Synovial Fluid 72 50 - 90 %   Eosinophils-Synovial 0 0 - 1 %  Gram stain  Status: None   Collection Time: 12/03/20 10:18 PM   Specimen: Synovium  Result Value Ref Range   Specimen Description SYNOVIAL RIGHT KNEE    Special Requests NONE    Gram Stain      WBC PRESENT, PREDOMINANTLY MONONUCLEAR NO ORGANISMS SEEN CYTOSPIN SMEAR Performed at [facility], [address] [place], [place] [address]    Report Status 12/04/2020 FINAL     Imaging Studies: DG Knee 2 Views Right  Result Date: 12/03/2020 CLINICAL DATA:  Right knee pain and swelling. EXAM: RIGHT KNEE - 1-2 VIEW COMPARISON:  Study of 11/16/2020. FINDINGS: Again noted are tricompartmental changes of degenerative arthrosis, with joint space loss greatest in the medial femorotibial joint where it is bone-on-bone, moderate bilateral femorotibial marginal osteophytes greater medially and small patellofemoral spurs. There is osteopenia without evidence of fractures. A moderate to large sized suprapatellar bursal effusion is present and is increased in size since the prior study, but in all other respects no further changes are seen. Soft tissues are otherwise unremarkable. IMPRESSION: Osteopenia and nonerosive DJD changes, with increasingly prominent suprapatellar bursal effusion. There is no destructive bone lesion or displaced fractures. Electronically Signed   By: Almira Bar M.D.   On:  12/03/2020 20:46   US Venous Img Lower Right (DVT Study)  Result Date: 12/03/2020 CLINICAL DATA:  Right knee pain/swelling x2 weeks EXAM: RIGHT LOWER EXTREMITY VENOUS DOPPLER ULTRASOUND TECHNIQUE: Gray-scale sonography with compression, as well as color and duplex ultrasound, were performed to evaluate the deep venous system(s) from the level of the common femoral vein through the popliteal and proximal calf veins. COMPARISON:  None. FINDINGS: VENOUS Normal compressibility of the common femoral, superficial femoral, and popliteal veins, as well as the visualized calf veins. Visualized portions of profunda femoral vein and great saphenous vein unremarkable. No filling defects to suggest DVT on grayscale or color Doppler imaging. Doppler waveforms show normal direction of venous flow, normal respiratory plasticity and response to augmentation. Limited views of the contralateral common femoral vein are unremarkable. OTHER 3.7 x 1.9 x 2.3 cm popliteal fossa cyst.  Right knee effusion. Limitations: none IMPRESSION: No evidence of DVT. Right knee effusion. Popliteal fossa cyst. Electronically Signed   By: Charline Bills M.D.   On: 12/03/2020 19:47    Synovial fluid findings are not consistent with a septic knee or gout.     Phillp Dolores, Jonny Ruiz, MD 12/04/20 [phone]

## 2020-12-09 LAB — PROTEIN, BODY FLUID (OTHER)

## 2020-12-09 LAB — CULTURE, BODY FLUID W GRAM STAIN -BOTTLE: Culture: NO GROWTH

## 2020-12-09 LAB — URIC ACID, BODY FLUID

## 2020-12-09 LAB — GLUCOSE, BODY FLUID OTHER

## 2021-01-10 ENCOUNTER — Ambulatory Visit (INDEPENDENT_AMBULATORY_CARE_PROVIDER_SITE_OTHER): Payer: Self-pay | Admitting: Orthopaedic Surgery

## 2021-01-10 DIAGNOSIS — M25561 Pain in right knee: Secondary | ICD-10-CM

## 2021-01-10 DIAGNOSIS — M1711 Unilateral primary osteoarthritis, right knee: Secondary | ICD-10-CM | POA: Insufficient documentation

## 2021-01-10 MED ORDER — METHYLPREDNISOLONE ACETATE 40 MG/ML IJ SUSP
40.0000 mg | INTRAMUSCULAR | Status: AC | PRN
  Administered 2021-01-10: 40 mg via INTRA_ARTICULAR

## 2021-01-10 MED ORDER — LIDOCAINE HCL 1 % IJ SOLN
3.0000 mL | INTRAMUSCULAR | Status: AC | PRN
  Administered 2021-01-10: 3 mL

## 2021-01-10 NOTE — Progress Notes (Signed)
Office Visit Note   Patient: Tracy Mercado           Date of Birth: 06-25-1960           MRN: [id number] Visit Date: 01/10/2021              Requested by: No referring provider defined for this encounter. PCP: Patient, No Pcp Per (Inactive)   Assessment & Plan: Visit Diagnoses:  1. Acute pain of right knee   2. Unilateral primary osteoarthritis, right knee     Plan: I did go over the patient's x-rays with her and sure this to the extent of her severe tricompartment arthritis to the right knee.  I was then able to aspirate 30 cc of yellow clear fluid from the knee and place a sterile injection of the right knee.  I would like to see her back in 4 weeks to see how she is doing overall.  We did talk about the possibility of knee replacement surgery in the future.  All questions and concerns were answered and addressed.  Follow-Up Instructions: Return in about 4 weeks (around 02/07/2021).   Orders:  Orders Placed This Encounter  Procedures   Large Joint Inj   No orders of the defined types were placed in this encounter.     Procedures: Large Joint Inj: R knee on 01/10/2021 10:41 AM Indications: diagnostic evaluation and pain Details: 22 G 1.5 in needle, superolateral approach  Arthrogram: No  Medications: 3 mL lidocaine 1 %; 40 mg methylPREDNISolone acetate 40 MG/ML Outcome: tolerated well, no immediate complications Procedure, treatment alternatives, risks and benefits explained, specific risks discussed. Consent was given by the patient. Immediately prior to procedure a time out was called to verify the correct patient, procedure, equipment, support staff and site/side marked as required. Patient was prepped and draped in the usual sterile fashion.      Clinical Data: No additional findings.   Subjective: Chief Complaint  Patient presents with   Right Knee - Pain  The patient is a 60 year old female who comes in with knee pain for the last 2 months.  This is of her  right knee.  She has remote history of arthroscopic surgery on that right knee.  She is had no known injury.  She was seen in the emergency room and they did drain some fluid off of her knee.  She has x-rays from the review as well.  She does walk with a limp and the knee has been hurting her quite a bit to the point that it is detrimentally affecting her mobility, her quality of life and actives daily living.  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  She denies any diabetes and does not take blood thinning medication or any other medications.  HPI  Review of Systems There is currently listed no headache, chest pain, shortness of breath, fever, chills, nausea, vomiting  Objective: Vital Signs: There were no vitals taken for this visit.  Physical Exam She is alert and orient x3 and in no acute distress Ortho Exam Examination of her right knee shows patellofemoral pain and medial joint line tenderness as well as an effusion.  There is good range of motion of her knee and is ligamentously stable but painful throughout the arc of motion. Specialty Comments:  No specialty comments available.  Imaging: No results found. X-rays of her right knee reviewed on the canopy system show  tricompartment arthritis with varus malalignment, bone-on-bone wear of the medial compartment of the knee and patellofemoral joint.  PMFS History: Patient Active Problem List   Diagnosis Date Noted   Unilateral primary osteoarthritis, right knee 01/10/2021   Past Medical History:  Diagnosis Date   CAD (coronary artery disease)    Hypertension     Family History  Problem Relation Age of Onset   Stroke Mother    Healthy Father     Past Surgical History:  Procedure Laterality Date   HAND SURGERY Left    KNEE ARTHROSCOPY     Social History   Occupational History   Not on file  Tobacco Use   Smoking status: Every Day    Packs/day: 0.50    Types: Cigarettes    Smokeless tobacco: Never  Vaping Use   Vaping Use: Never used  Substance and Sexual Activity   Alcohol use: Yes   Drug use: No   Sexual activity: Not Currently

## 2021-01-25 ENCOUNTER — Ambulatory Visit (INDEPENDENT_AMBULATORY_CARE_PROVIDER_SITE_OTHER): Payer: 59 | Admitting: Orthopaedic Surgery

## 2021-01-25 ENCOUNTER — Ambulatory Visit (INDEPENDENT_AMBULATORY_CARE_PROVIDER_SITE_OTHER): Payer: 59

## 2021-01-25 ENCOUNTER — Encounter: Payer: Self-pay | Admitting: Orthopaedic Surgery

## 2021-01-25 DIAGNOSIS — M25551 Pain in right hip: Secondary | ICD-10-CM

## 2021-01-25 DIAGNOSIS — M1711 Unilateral primary osteoarthritis, right knee: Secondary | ICD-10-CM | POA: Diagnosis not present

## 2021-01-25 MED ORDER — TIZANIDINE HCL 4 MG PO TABS: 4.0000 mg | ORAL_TABLET | Freq: Three times a day (TID) | ORAL | 0 refills | Status: DC | PRN

## 2021-01-25 NOTE — Progress Notes (Signed)
Office Visit Note   Patient: Tracy Mercado           Date of Birth: 23-Dec-1960           MRN: [id number] Visit Date: 01/25/2021              Requested by: No referring provider defined for this encounter. PCP: Patient, No Pcp Per (Inactive)   Assessment & Plan: Visit Diagnoses:  1. Pain in right hip   2. Unilateral primary osteoarthritis, right knee     Plan: At this point we are recommending a knee replacement for her given the failure of all conservative treatment options combined with her x-rays and clinical exam findings.  I did show her knee replacement model and described in detail what the surgery involves including a discussion of the risk and benefits of surgery and what to expect with an intraoperative and postoperative course.  At this point given the severity of her pain and the deformity of her right knee she does wish to proceed with the surgery in the near future.  We will be in touch about this.  Follow-Up Instructions: Return for 2 weeks post-op.   Orders:  Orders Placed This Encounter  Procedures   XR HIP UNILAT W OR W/O PELVIS 2-3 VIEWS RIGHT   Meds ordered this encounter  Medications   tiZANidine (ZANAFLEX) 4 MG tablet    Sig: Take 1 tablet (4 mg total) by mouth every 8 (eight) hours as needed for muscle spasms.    Dispense:  30 tablet    Refill:  0      Procedures: No procedures performed   Clinical Data: No additional findings.   Subjective: Chief Complaint  Patient presents with   Right Knee - Follow-up   Right Hip - Pain  The patient comes in for follow-up as a relates to her severe right knee pain and swelling.  She has been seen in the ER and have seen her as well.  She has had numerous aspirations of her right knee that showed no evidence of infection or gout.  Her x-rays show complete loss of joint space of her right knee with bone-on-bone wear.  The aspiration that I performed recently showed only arthritic type of fluid that was  clear.  The steroid injection did not help much at all.  She has been alternating oral anti-inflammatories and Tylenol arthritis.  She is now having some hip pain and she walks with a limp.  She is 61 years old.  She denies any medical problems but has not seen a physician in many years and does not have established primary care physician.  I was able to review all of her labs where she has been in the ER over the years and showed no worrisome features of her labs.  She is not a diabetic.  At this point her knee pain is definitely affecting her mobility, her quality of life and actives daily living.  All conservative treatment measures have failed.  HPI  Review of Systems There is currently listed no headache, chest pain, shortness of breath, fever, chills, nausea, vomiting  Objective: Vital Signs: There were no vitals taken for this visit.  Physical Exam She is alert and orient x3 and in no acute distress Ortho Exam Examination of her right knee shows varus malalignment.  There is once again a very large joint effusion and limited flexion extension secondary to pain in her right knee.  Her calf is soft.  Her hip exam on the right side is normal. Specialty Comments:  No specialty comments available.  Imaging: XR HIP UNILAT W OR W/O PELVIS 2-3 VIEWS RIGHT  Result Date: 01/25/2021 An AP pelvis lateral right hip are entirely normal.  The hip joint space is well-maintained with both hips and there is no acute changes or evidence of fracture.    PMFS History: Patient Active Problem List   Diagnosis Date Noted   Unilateral primary osteoarthritis, right knee 01/10/2021   Past Medical History:  Diagnosis Date   CAD (coronary artery disease)    Hypertension     Family History  Problem Relation Age of Onset   Stroke Mother    Healthy Father     Past Surgical History:  Procedure Laterality Date   HAND SURGERY Left    KNEE ARTHROSCOPY     Social History   Occupational History   Not  on file  Tobacco Use   Smoking status: Every Day    Packs/day: 0.50    Types: Cigarettes   Smokeless tobacco: Never  Vaping Use   Vaping Use: Never used  Substance and Sexual Activity   Alcohol use: Yes   Drug use: No   Sexual activity: Not Currently

## 2021-02-07 ENCOUNTER — Ambulatory Visit: Payer: Self-pay | Admitting: Orthopaedic Surgery

## 2021-02-09 ENCOUNTER — Other Ambulatory Visit: Payer: Self-pay | Admitting: Orthopaedic Surgery

## 2021-02-16 ENCOUNTER — Other Ambulatory Visit: Payer: Self-pay

## 2021-02-23 ENCOUNTER — Other Ambulatory Visit: Payer: Self-pay | Admitting: Orthopaedic Surgery

## 2021-03-21 ENCOUNTER — Other Ambulatory Visit: Payer: Self-pay | Admitting: Orthopaedic Surgery

## 2021-03-21 ENCOUNTER — Telehealth: Payer: Self-pay | Admitting: Physician Assistant

## 2021-03-21 NOTE — Telephone Encounter (Signed)
Pt called requesting a call back from [facility]. Or Autumn H about her pain. Pt is schedule for surgery next month and need to know what to do about her pain in the meantime. Please call pt at [phone]. ?

## 2021-03-22 ENCOUNTER — Other Ambulatory Visit: Payer: Self-pay | Admitting: Physician Assistant

## 2021-03-22 ENCOUNTER — Other Ambulatory Visit: Payer: Self-pay | Admitting: Orthopaedic Surgery

## 2021-03-22 DIAGNOSIS — M1711 Unilateral primary osteoarthritis, right knee: Secondary | ICD-10-CM

## 2021-03-22 MED ORDER — TRAMADOL HCL 50 MG PO TABS: 50.0000 mg | ORAL_TABLET | Freq: Three times a day (TID) | ORAL | 0 refills | Status: DC | PRN

## 2021-03-22 NOTE — Telephone Encounter (Signed)
Called and advised pt.

## 2021-03-29 NOTE — Pre-Procedure Instructions (Signed)
Surgical Instructions ? ? ? Your procedure is scheduled on Tuesday, April 4. ? Report to [facility] Main Entrance "A" at 10:00 A.M., then check in with the Admitting office. ? Call this number if you have problems the morning of surgery: ? [phone] ? ? If you have any questions prior to your surgery date call [phone]: Open Monday-Friday 8am-4pm ? ? ? Remember: ? Do not eat after midnight the night before your surgery ? ?You may drink clear liquids until 9:00 the morning of your surgery.   ?Clear liquids allowed are: Water, Non-Citrus Juices (without pulp), Carbonated Beverages, Clear Tea, Black Coffee ONLY (NO MILK, CREAM OR POWDERED CREAMER of any kind), and Gatorade ? ? ?Enhanced Recovery after Surgery for Orthopedics ?Enhanced Recovery after Surgery is a protocol used to improve the stress on your body and your recovery after surgery. ? ?Patient Instructions ? ?The day of surgery (if you do NOT have diabetes):  ?Drink ONE (1) Pre-Surgery Clear Ensure by _9:00_ am the morning of surgery   ?This drink was given to you during your hospital  ?pre-op appointment visit. ?Nothing else to drink after completing the  ?Pre-Surgery Clear Ensure. ? ? ?       If you have questions, please contact your surgeon?s office. ? ?  ? Take these medicines the morning of surgery with A SIP OF WATER:  ?tiZANidine (ZANAFLEX) if needed ? ? ?As of today, STOP taking any Aspirin (unless otherwise instructed by your surgeon) Aleve, Naproxen, Ibuprofen, Motrin, Advil, Goody's, BC's, all herbal medications, fish oil, and all vitamins. ? ?         ?Do not wear jewelry or makeup ?Do not wear lotions, powders, perfumes/colognes, or deodorant. ?Do not shave 48 hours prior to surgery.  Men may shave face and neck. ?Do not bring valuables to the hospital. ?Do not wear nail polish, gel polish, artificial nails, or any other type of covering on natural nails (fingers and toes) ?If you have artificial nails or gel coating that need to be  removed by a nail salon, please have this removed prior to surgery. Artificial nails or gel coating may interfere with anesthesia's ability to adequately monitor your vital signs. ? ?[facility] is not responsible for any belongings or valuables. .  ? ?Do NOT Smoke (Tobacco/Vaping)  24 hours prior to your procedure ? ?If you use a CPAP at night, you may bring your mask for your overnight stay. ?  ?Contacts, glasses, hearing aids, dentures or partials may not be worn into surgery, please bring cases for these belongings ?  ?For patients admitted to the hospital, discharge time will be determined by your treatment team. ?  ?Patients discharged the day of surgery will not be allowed to drive home, and someone needs to stay with them for 24 hours. ? ? ?SURGICAL WAITING ROOM VISITATION ?Patients having surgery or a procedure in a hospital may have two support people. ?Children under the age of 30 must have an adult with them who is not the patient. ?They may stay in the waiting area during the procedure and may switch out with other visitors. If the patient needs to stay at the hospital during part of their recovery, the visitor guidelines for inpatient rooms apply. ? ?Please refer to the [facility] website for the visitor guidelines for Inpatients (after your surgery is over and you are in a regular room).  ? ? ? ? ? ?Special instructions:   ? ?Oral Hygiene is also important to  reduce your risk of infection.  Remember - BRUSH YOUR TEETH THE MORNING OF SURGERY WITH YOUR REGULAR TOOTHPASTE ? ? ?Tracy Mercado- Preparing For Surgery ? ?Before surgery, you can play an important role. Because skin is not sterile, your skin needs to be as free of germs as possible. You can reduce the number of germs on your skin by washing with CHG (chlorahexidine gluconate) Soap before surgery.  CHG is an antiseptic cleaner which kills germs and bonds with the skin to continue killing germs even after washing.   ? ? ?Please do not use if you  have an allergy to CHG or antibacterial soaps. If your skin becomes reddened/irritated stop using the CHG.  ?Do not shave (including legs and underarms) for at least 48 hours prior to first CHG shower. It is OK to shave your face. ? ?Please follow these instructions carefully. ?  ? ? Shower the NIGHT BEFORE SURGERY and the MORNING OF SURGERY with CHG Soap.  ? If you chose to wash your hair, wash your hair first as usual with your normal shampoo. After you shampoo, rinse your hair and body thoroughly to remove the shampoo.  Then [employer] and genitals (private parts) with your normal soap and rinse thoroughly to remove soap. ? ?After that Use CHG Soap as you would any other liquid soap. You can apply CHG directly to the skin and wash gently with a scrungie or a clean washcloth.  ? ?Apply the CHG Soap to your body ONLY FROM THE NECK DOWN.  Do not use on open wounds or open sores. Avoid contact with your eyes, ears, mouth and genitals (private parts). Wash Face and genitals (private parts)  with your normal soap.  ? ?Wash thoroughly, paying special attention to the area where your surgery will be performed. ? ?Thoroughly rinse your body with warm water from the neck down. ? ?DO NOT shower/wash with your normal soap after using and rinsing off the CHG Soap. ? ?Pat yourself dry with a CLEAN TOWEL. ? ?Wear CLEAN PAJAMAS to bed the night before surgery ? ?Place CLEAN SHEETS on your bed the night before your surgery ? ?DO NOT SLEEP WITH PETS. ? ? ?Day of Surgery: ? ?Take a shower with CHG soap. ?Wear Clean/Comfortable clothing the morning of surgery ?Do not apply any deodorants/lotions.   ?Remember to brush your teeth WITH YOUR REGULAR TOOTHPASTE. ? ? ? ?If you received a COVID test during your pre-op visit  it is requested that you wear a mask when out in public, stay away from anyone that may not be feeling well and notify your surgeon if you develop symptoms. If you have been in contact with anyone that has tested  positive in the last 10 days please notify you surgeon. ? ?  ?Please read over the following fact sheets that you were given.  ? ?

## 2021-03-30 ENCOUNTER — Other Ambulatory Visit: Payer: Self-pay

## 2021-03-30 ENCOUNTER — Encounter (HOSPITAL_COMMUNITY): Payer: Self-pay

## 2021-03-30 ENCOUNTER — Encounter (HOSPITAL_COMMUNITY)
Admission: RE | Admit: 2021-03-30 | Discharge: 2021-03-30 | Disposition: A | Discharge status: Home / Self Care | Payer: 59 | Source: Ambulatory Visit | Attending: Orthopaedic Surgery | Admitting: Orthopaedic Surgery

## 2021-03-30 VITALS — BP 189/111 | HR 88 | Temp 98.3°F | Resp 17 | Ht 66.0 in | Wt 170.2 lb

## 2021-03-30 DIAGNOSIS — M1711 Unilateral primary osteoarthritis, right knee: Secondary | ICD-10-CM | POA: Insufficient documentation

## 2021-03-30 DIAGNOSIS — Z01818 Encounter for other preprocedural examination: Secondary | ICD-10-CM | POA: Diagnosis present

## 2021-03-30 HISTORY — DX: Unspecified osteoarthritis, unspecified site: M19.90

## 2021-03-30 LAB — BASIC METABOLIC PANEL
Anion gap: 7 (ref 5–15)
BUN: 6 mg/dL — ABNORMAL LOW (ref 8–23)
CO2: 25 mmol/L (ref 22–32)
Calcium: 10.2 mg/dL (ref 8.9–10.3)
Chloride: 105 mmol/L (ref 98–111)
Creatinine, Ser: 0.73 mg/dL (ref 0.44–1.00)
GFR, Estimated: >60 mL/min (ref >60)
Glucose, Bld: 107 mg/dL — ABNORMAL HIGH (ref 70–99)
Potassium: 4.3 mmol/L (ref 3.5–5.1)
Sodium: 137 mmol/L (ref 135–145)

## 2021-03-30 LAB — CBC
HCT: 41.5 % (ref 36.0–46.0)
Hemoglobin: 14.4 g/dL (ref 12.0–15.0)
MCH: 35.5 pg — ABNORMAL HIGH (ref 26.0–34.0)
MCHC: 34.7 g/dL (ref 30.0–36.0)
MCV: 102.2 fL — ABNORMAL HIGH (ref 80.0–100.0)
Platelets: 106 10*3/uL — ABNORMAL LOW (ref 150–400)
RBC: 4.06 MIL/uL (ref 3.87–5.11)
RDW: 13 % (ref 11.5–15.5)
WBC: 4.1 10*3/uL (ref 4.0–10.5)
nRBC: 0 % (ref 0.0–0.2)

## 2021-03-30 LAB — TYPE AND SCREEN
ABO/RH(D): O POS
Antibody Screen: NEGATIVE

## 2021-03-30 LAB — SURGICAL PCR SCREEN
MRSA, PCR: NEGATIVE
Staphylococcus aureus: NEGATIVE

## 2021-03-30 NOTE — Progress Notes (Signed)
PCP - none ?Cardiologist - none ? ?PPM/ICD - denies ?Device Orders -  ?Rep Notified -  ? ?Chest x-ray -  ?EKG - 03/30/21 ?Stress Test -  ?ECHO -  ?Cardiac Cath -  ? ?Sleep Study - none ?CPAP -  ? ?Fasting Blood Sugar - n/a ?Checks Blood Sugar _____ times a day ? ?Blood Thinner Instructions:n/a ?Aspirin Instructions:n/a ? ?ERAS Protcol -clear liquids until 0900 ?PRE-SURGERY Ensure or G2- Ensure ? ?COVID TEST- n/a ? ? ?Anesthesia review: yes for elevated BP. Pt has no PCP and is not on meds; abnormal EKG ? ?Patient denies shortness of breath, fever, cough and chest pain at PAT appointment. Pt 's  BP elevated at PAT appointment- 189/111 and 170/110 manually. Pt denied CP,headache, shortness of breath; only complaint was knee pain. BP and EKG result reported to Antionette Poles PA. Per his recommendations, pt was instructed to get a BP cuff and check and record her BP at home on the days prior to surgery. Pt stated that she would do this and she said she understands that if her BP remains elevated she may need to take medicine to control it prior to having surgery.  ? ? ?All instructions explained to the patient, with a verbal understanding of the material. Patient agrees to go over the instructions while at home for a better understanding. Patient also instructed to wear a mask when out in public prior to surgery.The opportunity to ask questions was provided. ?  ?

## 2021-04-02 ENCOUNTER — Telehealth: Payer: Self-pay | Admitting: Orthopaedic Surgery

## 2021-04-02 ENCOUNTER — Other Ambulatory Visit: Payer: Self-pay | Admitting: Physician Assistant

## 2021-04-04 NOTE — H&P (Signed)
TOTAL KNEE ADMISSION H&P ? ?Patient is being admitted for right total knee arthroplasty. ? ?Subjective: ? ?Chief Complaint:right knee pain. ? ?HPI: Tracy Mercado, 61 y.o. female, has a history of pain and functional disability in the right knee due to arthritis and has failed non-surgical conservative treatments for greater than 12 weeks to includeNSAID's and/or analgesics, corticosteriod injections, flexibility and strengthening excercises, use of assistive devices, weight reduction as appropriate, and activity modification.  Onset of symptoms was gradual, starting 2 years ago with gradually worsening course since that time. The patient noted prior procedures on the knee to include  arthroscopy on the right knee(s).  Patient currently rates pain in the right knee(s) at 10 out of 10 with activity. Patient has night pain, worsening of pain with activity and weight bearing, pain that interferes with activities of daily living, pain with passive range of motion, crepitus, and joint swelling.  Patient has evidence of subchondral sclerosis, periarticular osteophytes, and joint space narrowing by imaging studies. There is no active infection. ? ?Patient Active Problem List  ? Diagnosis Date Noted  ? Unilateral primary osteoarthritis, right knee 01/10/2021  ? ?Past Medical History:  ?Diagnosis Date  ? Arthritis   ? CAD (coronary artery disease)   ? Hypertension   ?  ?Past Surgical History:  ?Procedure Laterality Date  ? HAND SURGERY Left   ? KNEE ARTHROSCOPY    ?  ?No current facility-administered medications for this encounter.  ? ?Current Outpatient Medications  ?Medication Sig Dispense Refill Last Dose  ? ibuprofen (ADVIL) 200 MG tablet Take 800 mg by mouth every 8 (eight) hours as needed (for pain.).     ? tiZANidine (ZANAFLEX) 4 MG tablet TAKE 1 TABLET(4 MG) BY MOUTH EVERY 8 HOURS AS NEEDED FOR MUSCLE SPASMS 30 tablet 0   ? traMADol (ULTRAM) 50 MG tablet Take 1-2 tablets (50-100 mg total) by mouth 3 (three) times  daily as needed. (Patient not taking: Reported on 03/24/2021) 30 tablet 0 Not Taking  ? HYDROcodone-acetaminophen (NORCO) 5-325 MG tablet Take 1 tablet by mouth every 6 (six) hours as needed for severe pain. (Patient not taking: Reported on 03/24/2021) 12 tablet 0 Not Taking  ? Misc. Devices (WRIST BRACE) MISC APPLY LEFT AND RIGHT WRIST BRACE TO BILATERAL WRIST/FOREARM. FOR STABILITY AND SUPPORT. TO BE FITTED BY MEDICAL SUPPLY. 2 each 0   ? predniSONE (DELTASONE) 20 MG tablet Take 2 tablets (40 mg total) by mouth daily. (Patient not taking: Reported on 03/24/2021) 10 tablet 0 Not Taking  ? tiZANidine (ZANAFLEX) 4 MG tablet TAKE 1 TABLET(4 MG) BY MOUTH EVERY 8 HOURS AS NEEDED FOR MUSCLE SPASMS 30 tablet 0   ? tiZANidine (ZANAFLEX) 4 MG tablet TAKE 1 TABLET(4 MG) BY MOUTH EVERY 8 HOURS AS NEEDED FOR MUSCLE SPASMS 30 tablet 0   ? ?Allergies  ?Allergen Reactions  ? Hydrocodone Itching  ?  ?Social History  ? ?Tobacco Use  ? Smoking status: Every Day  ?  Packs/day: 0.50  ?  Types: Cigarettes  ? Smokeless tobacco: Never  ?Substance Use Topics  ? Alcohol use: Yes  ?  ?Family History  ?Problem Relation Age of Onset  ? Stroke Mother   ? Healthy Father   ?  ? ?Review of Systems  ?Musculoskeletal:  Positive for joint swelling.  ?All other systems reviewed and are negative. ? ?Objective: ? ?Physical Exam ?Vitals reviewed.  ?Constitutional:   ?   Appearance: Normal appearance.  ?HENT:  ?   Head: Normocephalic and atraumatic.  ?  Eyes:  ?   Extraocular Movements: Extraocular movements intact.  ?   Pupils: Pupils are equal, round, and reactive to light.  ?Cardiovascular:  ?   Rate and Rhythm: Normal rate.  ?   Pulses: Normal pulses.  ?Pulmonary:  ?   Effort: Pulmonary effort is normal.  ?Abdominal:  ?   Palpations: Abdomen is soft.  ?Musculoskeletal:  ?   Cervical back: Normal range of motion and neck supple.  ?   Right knee: Effusion, bony tenderness and crepitus present. Decreased range of motion. Tenderness present over the medial  joint line, lateral joint line and patellar tendon. Abnormal alignment and abnormal meniscus.  ?Neurological:  ?   Mental Status: She is alert and oriented to person, place, and time.  ?Psychiatric:     ?   Behavior: Behavior normal.  ? ? ?Vital signs in last 24 hours: ?  ? ?Labs: ? ? ?Estimated body mass index is 27.47 kg/m? as calculated from the following: ?  Height as of 03/30/21: 5\' 6"  (1.676 m). ?  Weight as of 03/30/21: 77.2 kg. ? ? ?Imaging Review ?Plain radiographs demonstrate severe degenerative joint disease of the right knee(s). The overall alignment ismild varus. The bone quality appears to be good for age and reported activity level. ? ? ? ? ? ?Assessment/Plan: ? ?End stage arthritis, right knee  ? ?The patient history, physical examination, clinical judgment of the provider and imaging studies are consistent with end stage degenerative joint disease of the right knee(s) and total knee arthroplasty is deemed medically necessary. The treatment options including medical management, injection therapy arthroscopy and arthroplasty were discussed at length. The risks and benefits of total knee arthroplasty were presented and reviewed. The risks due to aseptic loosening, infection, stiffness, patella tracking problems, thromboembolic complications and other imponderables were discussed. The patient acknowledged the explanation, agreed to proceed with the plan and consent was signed. Patient is being admitted for inpatient treatment for surgery, pain control, PT, OT, prophylactic antibiotics, VTE prophylaxis, progressive ambulation and ADL's and discharge planning. The patient is planning to be discharged home with home health services ? ? ? ? ?

## 2021-04-04 NOTE — Anesthesia Preprocedure Evaluation (Addendum)
Anesthesia Evaluation  ?Patient identified by MRN, date of birth, ID band ?Patient awake ? ? ? ?Reviewed: ?Allergy & Precautions, H&P , NPO status , Patient's Chart, lab work & pertinent test results ? ?Airway ?Mallampati: II ? ? ?Neck ROM: full ? ? ? Dental ?  ?Pulmonary ?Current Smoker,  ?  ?breath sounds clear to auscultation ? ? ? ? ? ? Cardiovascular ?hypertension,  ?Rhythm:regular Rate:Normal ? ? ?  ?Neuro/Psych ?  ? GI/Hepatic ?  ?Endo/Other  ? ? Renal/GU ?  ? ?  ?Musculoskeletal ? ? Abdominal ?  ?Peds ? Hematology ?  ?Anesthesia Other Findings ? ? Reproductive/Obstetrics ? ?  ? ? ? ? ? ? ? ? ? ? ? ? ? ?  ?  ? ? ? ? ? ? ? ?Anesthesia Physical ?Anesthesia Plan ? ?ASA: 2 ? ?Anesthesia Plan: Spinal and MAC  ? ?Post-op Pain Management: Regional block*  ? ?Induction:  ? ?PONV Risk Score and Plan: 1 and Ondansetron, Propofol infusion, Treatment may vary due to age or medical condition and Midazolam ? ?Airway Management Planned: Simple Face Mask ? ?Additional Equipment:  ? ?Intra-op Plan:  ? ?Post-operative Plan:  ? ?Informed Consent: I have reviewed the patients History and Physical, chart, labs and discussed the procedure including the risks, benefits and alternatives for the proposed anesthesia with the patient or authorized representative who has indicated his/her understanding and acceptance.  ? ? ? ?Dental advisory given ? ?Plan Discussed with: CRNA, Anesthesiologist and Surgeon ? ?Anesthesia Plan Comments: (PAT note by Karoline Caldwell, PA-C: ?Patient noted to have markedly elevated blood pressure preadmission testing appointment.  189/111.  Patient reported severe pain secondary to osteoarthritis of the right knee.  She denies any prior diagnosis of hypertension, denies being prescribed any antihypertensive medications.  Denies any history of cardiovascular disease.  She denies chest pain, headache, shortness of breath, leg swelling.  Recent ED visit notes were reviewed showing  only mildly elevated blood pressure.  EKG at PAT shows sinus rhythm with PACs, rate 80.  Discussed with patient that her current blood pressures are borderline for being able to proceed safely with elective surgery.  Advised that she purchase home blood pressure monitor.  I told her I would call to follow-up with her.  Patient verbalized understanding.  I called and spoke with patient on 04/01/2021.  She reported that she did buy home blood pressure monitor.  Her pressure on the evening of 03/30/2021 was 160/100 and the following morning it was 140/72.  Discussed that these numbers were much more compatible with proceeding with elective surgery.  She will continue to monitor her pressure at home and if there is a trend of it being significantly elevated she will reach out to her primary care provider. ? ?BP Readings from Last 3 Encounters: ?03/30/21 (!) 189/111 ?12/04/20 136/82 ?11/16/20 126/78 ?  ? ?Preop labs reviewed, platelets mildly low at 106k (appears to have chronic mild thrombocytopenia), labs otherwise unremarkable. ? ?EKG 03/30/2021: Sinus rhythm with premature atrial complexes.  Possible septal infarct, age undetermined.  Rate 80.  No significant change compared with prior. ? ?)  ? ? ? ? ? ?Anesthesia Quick Evaluation ? ?

## 2021-04-04 NOTE — Progress Notes (Signed)
Anesthesia Chart Review: ? ?Patient noted to have markedly elevated blood pressure preadmission testing appointment.  189/111.  Patient reported severe pain secondary to osteoarthritis of the right knee.  She denies any prior diagnosis of hypertension, denies being prescribed any antihypertensive medications.  Denies any history of cardiovascular disease.  She denies chest pain, headache, shortness of breath, leg swelling.  Recent ED visit notes were reviewed showing only mildly elevated blood pressure.  EKG at PAT shows sinus rhythm with PACs, rate 80.  Discussed with patient that her current blood pressures are borderline for being able to proceed safely with elective surgery.  Advised that she purchase home blood pressure monitor.  I told her I would call to follow-up with her.  Patient verbalized understanding.  I called and spoke with patient on 04/01/2021.  She reported that she did buy home blood pressure monitor.  Her pressure on the evening of 03/30/2021 was 160/100 and the following morning it was 140/72.  Discussed that these numbers were much more compatible with proceeding with elective surgery.  She will continue to monitor her pressure at home and if there is a trend of it being significantly elevated she will reach out to her primary care provider. ? ?BP Readings from Last 3 Encounters:  ?03/30/21 (!) 189/111  ?12/04/20 136/82  ?11/16/20 126/78  ?  ? ?Preop labs reviewed, platelets mildly low at 106k (appears to have chronic mild thrombocytopenia), labs otherwise unremarkable. ? ?EKG 03/30/2021: Sinus rhythm with premature atrial complexes.  Possible septal infarct, age undetermined.  Rate 80.  No significant change compared with prior. ? ? ? ?Karoline Caldwell, PA-C ?[facility] Short Stay Center/Anesthesiology ?Phone [phone] ?04/04/2021 10:24 AM ? ?

## 2021-04-05 ENCOUNTER — Encounter (HOSPITAL_COMMUNITY): Payer: Self-pay | Admitting: Orthopaedic Surgery

## 2021-04-05 ENCOUNTER — Other Ambulatory Visit: Payer: Self-pay

## 2021-04-05 ENCOUNTER — Encounter (HOSPITAL_COMMUNITY)
Admission: RE | Disposition: A | Discharge status: Home Health | Payer: Self-pay | Source: Home / Self Care | Attending: Orthopaedic Surgery

## 2021-04-05 ENCOUNTER — Observation Stay (HOSPITAL_COMMUNITY)
Admission: RE | Admit: 2021-04-05 | Discharge: 2021-04-06 | Disposition: A | Discharge status: Home Health | Payer: 59 | Attending: Orthopaedic Surgery | Admitting: Orthopaedic Surgery

## 2021-04-05 ENCOUNTER — Ambulatory Visit (HOSPITAL_BASED_OUTPATIENT_CLINIC_OR_DEPARTMENT_OTHER): Payer: 59 | Admitting: Physician Assistant

## 2021-04-05 ENCOUNTER — Observation Stay (HOSPITAL_COMMUNITY): Payer: 59

## 2021-04-05 ENCOUNTER — Ambulatory Visit (HOSPITAL_COMMUNITY): Payer: 59 | Admitting: Physician Assistant

## 2021-04-05 DIAGNOSIS — M1711 Unilateral primary osteoarthritis, right knee: Principal | ICD-10-CM | POA: Insufficient documentation

## 2021-04-05 DIAGNOSIS — Z79899 Other long term (current) drug therapy: Secondary | ICD-10-CM | POA: Insufficient documentation

## 2021-04-05 DIAGNOSIS — Z96651 Presence of right artificial knee joint: Secondary | ICD-10-CM

## 2021-04-05 DIAGNOSIS — F17210 Nicotine dependence, cigarettes, uncomplicated: Secondary | ICD-10-CM | POA: Diagnosis not present

## 2021-04-05 DIAGNOSIS — I10 Essential (primary) hypertension: Secondary | ICD-10-CM | POA: Diagnosis not present

## 2021-04-05 DIAGNOSIS — I2510 Atherosclerotic heart disease of native coronary artery without angina pectoris: Secondary | ICD-10-CM | POA: Insufficient documentation

## 2021-04-05 HISTORY — PX: TOTAL KNEE ARTHROPLASTY: SHX125

## 2021-04-05 LAB — ABO/RH: ABO/RH(D): O POS

## 2021-04-05 SURGERY — ARTHROPLASTY, KNEE, TOTAL
Anesthesia: Monitor Anesthesia Care | Site: Knee | Laterality: Right

## 2021-04-05 MED ORDER — OXYCODONE HCL 5 MG PO TABS
10.0000 mg | ORAL_TABLET | ORAL | Status: DC | PRN
  Administered 2021-04-05 – 2021-04-06 (×4): 15 mg via ORAL
  Filled 2021-04-05 (×4): qty 3

## 2021-04-05 MED ORDER — OXYCODONE HCL 5 MG/5ML PO SOLN: 5.0000 mg | Freq: Once | ORAL | Status: DC | PRN

## 2021-04-05 MED ORDER — PHENOL 1.4 % MT LIQD: 1.0000 | OROMUCOSAL | Status: DC | PRN

## 2021-04-05 MED ORDER — MENTHOL 3 MG MT LOZG: 1.0000 | LOZENGE | OROMUCOSAL | Status: DC | PRN

## 2021-04-05 MED ORDER — PHENYLEPHRINE HCL-NACL 20-0.9 MG/250ML-% IV SOLN
INTRAVENOUS | Status: DC | PRN
  Administered 2021-04-05: 50 ug/min via INTRAVENOUS

## 2021-04-05 MED ORDER — DEXMEDETOMIDINE (PRECEDEX) IN NS 20 MCG/5ML (4 MCG/ML) IV SYRINGE
PREFILLED_SYRINGE | INTRAVENOUS | Status: DC | PRN
  Administered 2021-04-05: 20 ug via INTRAVENOUS

## 2021-04-05 MED ORDER — ONDANSETRON HCL 4 MG/2ML IJ SOLN
INTRAMUSCULAR | Status: DC | PRN
Start: 2021-04-05 — End: 2021-04-05
  Administered 2021-04-05: 4 mg via INTRAVENOUS

## 2021-04-05 MED ORDER — HYDROMORPHONE HCL 1 MG/ML IJ SOLN
0.5000 mg | INTRAMUSCULAR | Status: DC | PRN
  Administered 2021-04-05 – 2021-04-06 (×2): 1 mg via INTRAVENOUS
  Filled 2021-04-05 (×2): qty 1

## 2021-04-05 MED ORDER — ONDANSETRON HCL 4 MG/2ML IJ SOLN: 4.0000 mg | Freq: Four times a day (QID) | INTRAMUSCULAR | Status: DC | PRN

## 2021-04-05 MED ORDER — HYDRALAZINE HCL 20 MG/ML IJ SOLN
10.0000 mg | Freq: Once | INTRAMUSCULAR | Status: AC
  Administered 2021-04-05: 10 mg via INTRAVENOUS

## 2021-04-05 MED ORDER — FENTANYL CITRATE (PF) 100 MCG/2ML IJ SOLN: 25.0000 ug | INTRAMUSCULAR | Status: DC | PRN

## 2021-04-05 MED ORDER — METOCLOPRAMIDE HCL 5 MG/ML IJ SOLN: 5.0000 mg | Freq: Three times a day (TID) | INTRAMUSCULAR | Status: DC | PRN

## 2021-04-05 MED ORDER — PROPOFOL 500 MG/50ML IV EMUL
INTRAVENOUS | Status: DC | PRN
  Administered 2021-04-05: 100 ug/kg/min via INTRAVENOUS

## 2021-04-05 MED ORDER — FENTANYL CITRATE (PF) 100 MCG/2ML IJ SOLN
INTRAMUSCULAR | Status: DC | PRN
Start: 2021-04-05 — End: 2021-04-05
  Administered 2021-04-05: 50 ug via INTRAVENOUS

## 2021-04-05 MED ORDER — OXYCODONE HCL 5 MG PO TABS: 5.0000 mg | ORAL_TABLET | Freq: Once | ORAL | Status: DC | PRN

## 2021-04-05 MED ORDER — CHLORHEXIDINE GLUCONATE 0.12 % MT SOLN
15.0000 mL | Freq: Once | OROMUCOSAL | Status: AC
  Administered 2021-04-05: 15 mL via OROMUCOSAL
  Filled 2021-04-05: qty 15

## 2021-04-05 MED ORDER — BUPIVACAINE IN DEXTROSE 0.75-8.25 % IT SOLN
INTRATHECAL | Status: DC | PRN
  Administered 2021-04-05: 1.8 mL via INTRATHECAL

## 2021-04-05 MED ORDER — ORAL CARE MOUTH RINSE: 15.0000 mL | Freq: Once | OROMUCOSAL | Status: AC

## 2021-04-05 MED ORDER — ALUM & MAG HYDROXIDE-SIMETH 200-200-20 MG/5ML PO SUSP: 30.0000 mL | ORAL | Status: DC | PRN

## 2021-04-05 MED ORDER — MIDAZOLAM HCL 2 MG/2ML IJ SOLN: 1.0000 mg | Freq: Once | INTRAMUSCULAR | Status: AC

## 2021-04-05 MED ORDER — CEFAZOLIN SODIUM-DEXTROSE 2-4 GM/100ML-% IV SOLN
2.0000 g | INTRAVENOUS | Status: AC
  Administered 2021-04-05: 2 g via INTRAVENOUS
  Filled 2021-04-05: qty 100

## 2021-04-05 MED ORDER — LACTATED RINGERS IV SOLN: INTRAVENOUS | Status: DC

## 2021-04-05 MED ORDER — ACETAMINOPHEN 325 MG PO TABS
325.0000 mg | ORAL_TABLET | Freq: Four times a day (QID) | ORAL | Status: DC | PRN
  Administered 2021-04-06: 650 mg via ORAL
  Filled 2021-04-05: qty 2

## 2021-04-05 MED ORDER — ONDANSETRON HCL 4 MG PO TABS
4.0000 mg | ORAL_TABLET | Freq: Four times a day (QID) | ORAL | Status: DC | PRN
  Administered 2021-04-05: 4 mg via ORAL
  Filled 2021-04-05: qty 1

## 2021-04-05 MED ORDER — DEXAMETHASONE SODIUM PHOSPHATE 10 MG/ML IJ SOLN
INTRAMUSCULAR | Status: DC | PRN
  Administered 2021-04-05: 10 mg via INTRAVENOUS

## 2021-04-05 MED ORDER — SODIUM CHLORIDE 0.9 % IV SOLN: INTRAVENOUS | Status: DC

## 2021-04-05 MED ORDER — FENTANYL CITRATE (PF) 250 MCG/5ML IJ SOLN
INTRAMUSCULAR | Status: AC
  Filled 2021-04-05: qty 5

## 2021-04-05 MED ORDER — ASPIRIN 81 MG PO CHEW
81.0000 mg | CHEWABLE_TABLET | Freq: Two times a day (BID) | ORAL | Status: DC
  Administered 2021-04-05 – 2021-04-06 (×2): 81 mg via ORAL
  Filled 2021-04-05 (×2): qty 1

## 2021-04-05 MED ORDER — DOCUSATE SODIUM 100 MG PO CAPS
100.0000 mg | ORAL_CAPSULE | Freq: Two times a day (BID) | ORAL | Status: DC
  Administered 2021-04-05 – 2021-04-06 (×2): 100 mg via ORAL
  Filled 2021-04-05 (×2): qty 1

## 2021-04-05 MED ORDER — HYDRALAZINE HCL 20 MG/ML IJ SOLN
INTRAMUSCULAR | Status: AC
  Filled 2021-04-05: qty 1

## 2021-04-05 MED ORDER — PROPOFOL 10 MG/ML IV BOLUS
INTRAVENOUS | Status: DC | PRN
  Administered 2021-04-05 (×3): 20 mg via INTRAVENOUS

## 2021-04-05 MED ORDER — METHOCARBAMOL 500 MG PO TABS
500.0000 mg | ORAL_TABLET | Freq: Four times a day (QID) | ORAL | Status: DC | PRN
  Administered 2021-04-06: 500 mg via ORAL
  Filled 2021-04-05: qty 1

## 2021-04-05 MED ORDER — DEXTROSE 5 % IV SOLN
500.0000 mg | Freq: Four times a day (QID) | INTRAVENOUS | Status: DC | PRN
  Filled 2021-04-05: qty 5

## 2021-04-05 MED ORDER — SODIUM CHLORIDE 0.9 % IR SOLN
Status: DC | PRN
  Administered 2021-04-05: 1000 mL

## 2021-04-05 MED ORDER — DIPHENHYDRAMINE HCL 12.5 MG/5ML PO ELIX
12.5000 mg | ORAL_SOLUTION | ORAL | Status: DC | PRN
  Administered 2021-04-05 – 2021-04-06 (×2): 25 mg via ORAL
  Filled 2021-04-05 (×2): qty 10

## 2021-04-05 MED ORDER — 0.9 % SODIUM CHLORIDE (POUR BTL) OPTIME
TOPICAL | Status: DC | PRN
  Administered 2021-04-05: 1000 mL

## 2021-04-05 MED ORDER — MIDAZOLAM HCL 2 MG/2ML IJ SOLN
INTRAMUSCULAR | Status: AC
  Administered 2021-04-05: 1 mg via INTRAVENOUS
  Filled 2021-04-05: qty 2

## 2021-04-05 MED ORDER — FENTANYL CITRATE (PF) 100 MCG/2ML IJ SOLN
INTRAMUSCULAR | Status: AC
  Administered 2021-04-05: 50 ug via INTRAVENOUS
  Filled 2021-04-05: qty 2

## 2021-04-05 MED ORDER — PANTOPRAZOLE SODIUM 40 MG PO TBEC
40.0000 mg | DELAYED_RELEASE_TABLET | Freq: Every day | ORAL | Status: DC
  Administered 2021-04-05 – 2021-04-06 (×2): 40 mg via ORAL
  Filled 2021-04-05 (×2): qty 1

## 2021-04-05 MED ORDER — CEFAZOLIN SODIUM-DEXTROSE 1-4 GM/50ML-% IV SOLN
1.0000 g | Freq: Four times a day (QID) | INTRAVENOUS | Status: AC
  Administered 2021-04-05 (×2): 1 g via INTRAVENOUS
  Filled 2021-04-05 (×2): qty 50

## 2021-04-05 MED ORDER — POVIDONE-IODINE 10 % EX SWAB
2.0000 "application " | Freq: Once | CUTANEOUS | Status: AC
  Administered 2021-04-05: 2 via TOPICAL

## 2021-04-05 MED ORDER — TRANEXAMIC ACID-NACL 1000-0.7 MG/100ML-% IV SOLN
1000.0000 mg | INTRAVENOUS | Status: AC
  Administered 2021-04-05: 1000 mg via INTRAVENOUS
  Filled 2021-04-05: qty 100

## 2021-04-05 MED ORDER — METOCLOPRAMIDE HCL 5 MG PO TABS: 5.0000 mg | ORAL_TABLET | Freq: Three times a day (TID) | ORAL | Status: DC | PRN

## 2021-04-05 MED ORDER — ROPIVACAINE HCL 5 MG/ML IJ SOLN
INTRAMUSCULAR | Status: DC | PRN
  Administered 2021-04-05: 25 mL via PERINEURAL

## 2021-04-05 MED ORDER — FENTANYL CITRATE (PF) 100 MCG/2ML IJ SOLN: 50.0000 ug | Freq: Once | INTRAMUSCULAR | Status: AC

## 2021-04-05 MED ORDER — OXYCODONE HCL 5 MG PO TABS: 5.0000 mg | ORAL_TABLET | ORAL | Status: DC | PRN

## 2021-04-05 SURGICAL SUPPLY — 77 items
BAG COUNTER SPONGE SURGICOUNT (BAG) ×3 IMPLANT
BAG SPNG CNTER NS LX DISP (BAG) ×1
BANDAGE ESMARK 6X9 LF (GAUZE/BANDAGES/DRESSINGS) ×2 IMPLANT
BLADE SAG 18X100X1.27 (BLADE) ×3 IMPLANT
BNDG CMPR 9X6 STRL LF SNTH (GAUZE/BANDAGES/DRESSINGS) ×1
BNDG ELASTIC 6X5.8 VLCR STR LF (GAUZE/BANDAGES/DRESSINGS) ×5 IMPLANT
BNDG ESMARK 6X9 LF (GAUZE/BANDAGES/DRESSINGS) ×2
BOWL SMART MIX CTS (DISPOSABLE) ×3 IMPLANT
BSPLAT TIB 5D E CMNT KN RT (Knees) ×1 IMPLANT
CEMENT BONE R 1X40 (Cement) ×2 IMPLANT
COVER SURGICAL LIGHT HANDLE (MISCELLANEOUS) ×3 IMPLANT
CUFF TOURN SGL QUICK 34 (TOURNIQUET CUFF) ×2
CUFF TOURN SGL QUICK 42 (TOURNIQUET CUFF) IMPLANT
CUFF TRNQT CYL 34X4.125X (TOURNIQUET CUFF) ×2 IMPLANT
DRAPE EXTREMITY T 121X128X90 (DISPOSABLE) ×3 IMPLANT
DRAPE HALF SHEET 40X57 (DRAPES) ×3 IMPLANT
DRAPE U-SHAPE 47X51 STRL (DRAPES) ×3 IMPLANT
DRSG PAD ABDOMINAL 8X10 ST (GAUZE/BANDAGES/DRESSINGS) ×3 IMPLANT
DRSG XEROFORM 1X8 (GAUZE/BANDAGES/DRESSINGS) ×1 IMPLANT
DURAPREP 26ML APPLICATOR (WOUND CARE) ×3 IMPLANT
ELECT CAUTERY BLADE 6.4 (BLADE) ×3 IMPLANT
ELECT REM PT RETURN 9FT ADLT (ELECTROSURGICAL) ×2
ELECTRODE REM PT RTRN 9FT ADLT (ELECTROSURGICAL) ×2 IMPLANT
FACESHIELD WRAPAROUND (MASK) ×4 IMPLANT
FACESHIELD WRAPAROUND OR TEAM (MASK) ×4 IMPLANT
FEMUR CMT CR STD SZ 8 RT KNEE (Joint) ×2 IMPLANT
FEMUR CMTD CR STD SZ 8 RT KNEE (Joint) IMPLANT
GAUZE SPONGE 4X4 12PLY STRL (GAUZE/BANDAGES/DRESSINGS) ×3 IMPLANT
GAUZE SPONGE 4X4 12PLY STRL LF (GAUZE/BANDAGES/DRESSINGS) ×1 IMPLANT
GAUZE XEROFORM 1X8 LF (GAUZE/BANDAGES/DRESSINGS) ×3 IMPLANT
GLOVE SRG 8 PF TXTR STRL LF DI (GLOVE) ×4 IMPLANT
GLOVE SURG ORTHO LTX SZ7.5 (GLOVE) ×3 IMPLANT
GLOVE SURG ORTHO LTX SZ8 (GLOVE) ×3 IMPLANT
GLOVE SURG UNDER POLY LF SZ8 (GLOVE) ×4
GOWN STRL REUS W/ TWL LRG LVL3 (GOWN DISPOSABLE) IMPLANT
GOWN STRL REUS W/ TWL XL LVL3 (GOWN DISPOSABLE) ×4 IMPLANT
GOWN STRL REUS W/TWL LRG LVL3 (GOWN DISPOSABLE)
GOWN STRL REUS W/TWL XL LVL3 (GOWN DISPOSABLE) ×4
HANDPIECE INTERPULSE COAX TIP (DISPOSABLE) ×2
HDLS TROCR DRIL PIN KNEE 75 (PIN) ×2
IMMOBILIZER KNEE 20 (SOFTGOODS) ×2
IMMOBILIZER KNEE 20 THIGH 36 (SOFTGOODS) IMPLANT
IMMOBILIZER KNEE 22 UNIV (SOFTGOODS) ×3 IMPLANT
INSERT TIB ARTISURF SZ8-11X12 (Insert) ×2 IMPLANT
KIT BASIN OR (CUSTOM PROCEDURE TRAY) ×3 IMPLANT
KIT TURNOVER KIT B (KITS) ×3 IMPLANT
MANIFOLD NEPTUNE II (INSTRUMENTS) ×3 IMPLANT
NDL 18GX1X1/2 (RX/OR ONLY) (NEEDLE) IMPLANT
NEEDLE 18GX1X1/2 (RX/OR ONLY) (NEEDLE) IMPLANT
NS IRRIG 1000ML POUR BTL (IV SOLUTION) ×3 IMPLANT
PACK TOTAL JOINT (CUSTOM PROCEDURE TRAY) ×3 IMPLANT
PAD ABD 8X10 STRL (GAUZE/BANDAGES/DRESSINGS) ×1 IMPLANT
PAD ARMBOARD 7.5X6 YLW CONV (MISCELLANEOUS) ×3 IMPLANT
PADDING CAST COTTON 6X4 STRL (CAST SUPPLIES) ×3 IMPLANT
PIN DRILL HDLS TROCAR 75 4PK (PIN) IMPLANT
SCREW FEMALE HEX FIX 25X2.5 (ORTHOPEDIC DISPOSABLE SUPPLIES) ×1 IMPLANT
SET HNDPC FAN SPRY TIP SCT (DISPOSABLE) ×2 IMPLANT
SET PAD KNEE POSITIONER (MISCELLANEOUS) ×3 IMPLANT
STAPLER VISISTAT 35W (STAPLE) IMPLANT
STEM POLY PAT PLY 32M KNEE (Knees) ×1 IMPLANT
STEM TIBIA 5 DEG SZ E R KNEE (Knees) IMPLANT
STRIP CLOSURE SKIN 1/2X4 (GAUZE/BANDAGES/DRESSINGS) IMPLANT
SUCTION FRAZIER HANDLE 10FR (MISCELLANEOUS) ×2
SUCTION TUBE FRAZIER 10FR DISP (MISCELLANEOUS) ×2 IMPLANT
SUT MNCRL AB 4-0 PS2 18 (SUTURE) IMPLANT
SUT VIC AB 0 CT1 27 (SUTURE) ×2
SUT VIC AB 0 CT1 27XBRD ANBCTR (SUTURE) ×2 IMPLANT
SUT VIC AB 1 CT1 27 (SUTURE) ×4
SUT VIC AB 1 CT1 27XBRD ANBCTR (SUTURE) ×4 IMPLANT
SUT VIC AB 2-0 CT1 27 (SUTURE) ×4
SUT VIC AB 2-0 CT1 TAPERPNT 27 (SUTURE) ×4 IMPLANT
SYR 50ML LL SCALE MARK (SYRINGE) IMPLANT
TIBIA STEM 5 DEG SZ E R KNEE (Knees) ×2 IMPLANT
TOWEL GREEN STERILE (TOWEL DISPOSABLE) ×3 IMPLANT
TOWEL GREEN STERILE FF (TOWEL DISPOSABLE) ×3 IMPLANT
TRAY CATH 16FR W/PLASTIC CATH (SET/KITS/TRAYS/PACK) IMPLANT
WRAP KNEE MAXI GEL POST OP (GAUZE/BANDAGES/DRESSINGS) ×3 IMPLANT

## 2021-04-05 NOTE — Interval H&P Note (Signed)
History and Physical Interval Note: The patient understands that she is here today for a right total knee replacement to treat her right knee osteoarthritis.  There has been no acute or interval change in her medical status.  Please see H&P.  The risks and benefits of surgery have been explained in detail and informed consent is obtained.  The right operative knee has been marked. ? ?04/05/2021 ?11:14 AM ? ?Tracy Mercado  has presented today for surgery, with the diagnosis of Osteoarthritis /degenerative joint diease right knee.  The various methods of treatment have been discussed with the patient and family. After consideration of risks, benefits and other options for treatment, the patient has consented to  Procedure(s): ?Right TOTAL KNEE ARTHROPLASTY (Right) as a surgical intervention.  The patient's history has been reviewed, patient examined, no change in status, stable for surgery.  I have reviewed the patient's chart and labs.  Questions were answered to the patient's satisfaction.   ? ? ?Kathryne Hitch ? ? ?

## 2021-04-05 NOTE — Evaluation (Signed)
Physical Therapy Evaluation ?Patient Details ?Name: Tracy ReeveMarilyn Y Mercado ?MRN: [id number] ?DOB: 12/31/1960 ?Today's Date: 04/05/2021 ? ?History of Present Illness ? pt is a 61 y/o female admitted 4/4 with pain in R knee and failed non-surgical conservative treatments.  Pt is s/p TKA.  PMHx: HTN, arthritis and CAD  ?Clinical Impression ? Pt admitted with/for R TKA.  Pt needing min assist from standing from lower surfaces, supervision getting to EOB and min guard for gait.Marland Kitchen.  Pt currently limited functionally due to the problems listed below.  (see problems list.)  Pt will benefit from PT to maximize function and safety to be able to get home safely with available assist. ?   ?   ? ?Recommendations for follow up therapy are one component of a multi-disciplinary discharge planning process, led by the attending physician.  Recommendations may be updated based on patient status, additional functional criteria and insurance authorization. ? ?Follow Up Recommendations Home health PT ? ?  ?Assistance Recommended at Discharge Set up Supervision/Assistance  ?Patient can return home with the following ? A little help with bathing/dressing/bathroom;Assistance with cooking/housework;Assist for transportation;Help with stairs or ramp for entrance ? ?  ?Equipment Recommendations Rolling walker (2 wheels);BSC/3in1  ?Recommendations for Other Services ?    ?  ?Functional Status Assessment Patient has had a recent decline in their functional status and demonstrates the ability to make significant improvements in function in a reasonable and predictable amount of time.  ? ?  ?Precautions / Restrictions Precautions ?Precautions: None;Knee ?Required Braces or Orthoses: Knee Immobilizer - Right ?Knee Immobilizer - Right: Other (comment) (pt able to do a SLR <10 degrees x1, KI removed.)  ? ?  ? ?Mobility ? Bed Mobility ?Overal bed mobility: Needs Assistance ?Bed Mobility: Supine to Sit ?  ?  ?Supine to sit: Supervision ?  ?  ?General bed mobility  comments: bridged and came up and forward to EOB without assist or assist of the bed. ?  ? ?Transfers ?Overall transfer level: Needs assistance ?  ?Transfers: Sit to/from Stand ?Sit to Stand: Min assist ?  ?  ?  ?  ?  ?General transfer comment: cues for hand placement.  Assist forward and with boost. ?  ? ?Ambulation/Gait ?Ambulation/Gait assistance: Min guard ?Gait Distance (Feet): 250 Feet ?Assistive device: Rolling walker (2 wheels) ?Gait Pattern/deviations: Step-through pattern ?  ?Gait velocity interpretation: 1.31 - 2.62 ft/sec, indicative of limited community ambulator ?  ?General Gait Details: pt  was guided not to go so far, but it felt good until back to room with progressing nausea and pain.  pt still ambulated well with good heel toe pattern, improved with cues. ? ?Stairs ?  ?  ?  ?  ?  ? ?Wheelchair Mobility ?  ? ?Modified Rankin (Stroke Patients Only) ?  ? ?  ? ?Balance Overall balance assessment: Needs assistance ?Sitting-balance support: No upper extremity supported, Feet supported ?Sitting balance-Leahy Scale: Good ?  ?  ?Standing balance support: Bilateral upper extremity supported, Single extremity supported, During functional activity ?Standing balance-Leahy Scale: Poor ?Standing balance comment: reliant on UE's initially, expect quick progress. ?  ?  ?  ?  ?  ?  ?  ?  ?  ?  ?  ?   ? ? ? ?Pertinent Vitals/Pain Pain Assessment ?Pain Assessment: Faces ?Faces Pain Scale: Hurts even more ?Pain Location: progressively worsened with time up and spiked with donning of knee cooler. ?Pain Descriptors / Indicators: Burning, Aching, Sore ?Pain Intervention(s): Monitored during session  ? ? ?  Home Living Family/patient expects to be discharged to:: Private residence ?Living Arrangements: Spouse/significant other ?Available Help at Discharge: Family;Available PRN/intermittently;Available 24 hours/day ?Type of Home: Apartment ?Home Access: Stairs to enter ?Entrance Stairs-Rails: Right;Left ?Entrance  Stairs-Number of Steps: flight ?  ?Home Layout: One level ?Home Equipment: None ?   ?  ?Prior Function Prior Level of Function : Independent/Modified Independent ?  ?  ?  ?  ?  ?  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 ?ADLs Comments: Independent ?  ? ? ?Hand Dominance  ?   ? ?  ?Extremity/Trunk Assessment  ? Upper Extremity Assessment ?Upper Extremity Assessment: Overall WFL for tasks assessed ?  ? ?Lower Extremity Assessment ?Lower Extremity Assessment: RLE deficits/detail;Overall [facility] for tasks assessed ?RLE Deficits / Details: AAROM to 70 degrees, sitting to 90 degrees at EOB. ?RLE Sensation:  (sensation returning) ?  ? ?   ?Communication  ? Communication: No difficulties  ?Cognition Arousal/Alertness: Awake/alert ?Behavior During Therapy: [facility] for tasks assessed/performed ?Overall Cognitive Status: Within Functional Limits for tasks assessed ?  ?  ?  ?  ?  ?  ?  ?  ?  ?  ?  ?  ?  ?  ?  ?  ?  ?  ?  ? ?  ?General Comments   ? ?  ?Exercises Total Joint Exercises ?Quad Sets: AROM, Strengthening, Both, 10 reps, Supine, Other (comment) (difficult relax, 0 to -2degrees terminal ext.) ?Gluteal Sets: PROM, Strengthening, 10 reps, Supine ?Heel Slides: AAROM, Right, 10 reps, Supine ?Goniometric ROM: ~ 90 degrees sitting EOB  ? ?Assessment/Plan  ?  ?PT Assessment Patient needs continued PT services  ?PT Problem List Decreased strength;Decreased activity tolerance;Decreased mobility;Decreased knowledge of use of DME;Pain ? ?   ?  ?PT Treatment Interventions Gait training;DME instruction;Stair training;Functional mobility training;Therapeutic activities;Balance training;Patient/family education   ? ?PT Goals (Current goals can be found in the Care Plan section)  ?Acute Rehab PT Goals ?Patient Stated Goal: home and back to work soon. ?PT Goal Formulation: With patient ?Potential to Achieve Goals: Good ? ?  ?Frequency BID ?  ? ? ?Co-evaluation   ?  ?  ?  ?  ? ? ?  ?AM-PAC PT "6 Clicks" Mobility  ?Outcome  Measure Help needed turning from your back to your side while in a flat bed without using bedrails?: A Little ?Help needed moving from lying on your back to sitting on the side of a flat bed without using bedrails?: A Little ?Help needed moving to and from a bed to a chair (including a wheelchair)?: A Little ?Help needed standing up from a chair using your arms (e.g., wheelchair or bedside chair)?: A Little ?Help needed to walk in hospital room?: A Little ?Help needed climbing 3-5 steps with a railing? : A Little ?6 Click Score: 18 ? ?  ?End of Session   ?Activity Tolerance: Patient tolerated treatment well;Other (comment) (mildly limited by pain and nausea after pushing herself too much.) ?Patient left: in bed;with call bell/phone within reach;with family/visitor present ?Nurse Communication: Mobility status ?PT Visit Diagnosis: Other abnormalities of gait and mobility (R26.89);Ataxic gait (R26.0);Pain ?Pain - Right/Left: Right ?Pain - part of body: Knee ?  ? ?Time: 0175-1025 ?PT Time Calculation (min) (ACUTE ONLY): 33 min ? ? ?Charges:   PT Evaluation ?$PT Eval Low Complexity: 1 Low ?PT Treatments ?$Gait Training: 8-22 mins ?  ?   ? ? ?04/05/2021 ? ?Jacinto Halim., PT ?Acute Rehabilitation Services ?[phone]  (pager) ?[phone]  (  office) ? ?Eliseo Gum Yael Coppess ?04/05/2021, 7:08 PM ? ?

## 2021-04-05 NOTE — Brief Op Note (Signed)
04/05/2021 ? ?1:48 PM ? ?PATIENT:  HAYDAN WEDIG  61 y.o. female ? ?PRE-OPERATIVE DIAGNOSIS:  Osteoarthritis /degenerative joint diease right knee ? ?POST-OPERATIVE DIAGNOSIS:  Osteoarthritis /degenerative joint diease right knee ? ?PROCEDURE:  Procedure(s): ?Right TOTAL KNEE ARTHROPLASTY (Right) ? ?SURGEON:  Surgeon(s) and Role: ?   Kathryne Hitch, MD - Primary ? ?PHYSICIAN ASSISTANT:  Rexene Edison, PA-C ? ?ANESTHESIA:   regional and spinal ? ?EBL:  50 mL  ? ?COUNTS:  YES ? ?TOURNIQUET:  * Missing tourniquet times found for documented tourniquets in log: [id number] * ? ?DICTATION: .Other Dictation: Dictation Number [id number] ? ?PLAN OF CARE: Admit for overnight observation ? ?PATIENT DISPOSITION:  PACU - hemodynamically stable. ?  ?Delay start of Pharmacological VTE agent (>24hrs) due to surgical blood loss or risk of bleeding: no ? ?

## 2021-04-05 NOTE — Plan of Care (Signed)
?  Problem: Education: ?Goal: Knowledge of the prescribed therapeutic regimen will improve ?Outcome: Progressing ?  ?Problem: Activity: ?Goal: Ability to avoid complications of mobility impairment will improve ?Outcome: Progressing ?  ?Problem: Pain Management: ?Goal: Pain level will decrease with appropriate interventions ?Outcome: Progressing ?  ?Problem: Skin Integrity: ?Goal: Will show signs of wound healing ?Outcome: Progressing ?  ?

## 2021-04-05 NOTE — Anesthesia Procedure Notes (Signed)
Spinal ? ?Patient location during procedure: OR ?Start time: 04/05/2021 12:08 PM ?End time: 04/05/2021 12:12 PM ?Reason for block: surgical anesthesia ?Staffing ?Performed: anesthesiologist  ?Anesthesiologist: Albertha Ghee, MD ?Preanesthetic Checklist ?Completed: patient identified, IV checked, risks and benefits discussed, surgical consent, monitors and equipment checked, pre-op evaluation and timeout performed ?Spinal Block ?Patient position: sitting ?Prep: DuraPrep ?Patient monitoring: cardiac monitor, continuous pulse ox and blood pressure ?Approach: midline ?Location: L3-4 ?Injection technique: single-shot ?Needle ?Needle type: Pencan  ?Needle gauge: 24 G ?Needle length: 9 cm ?Assessment ?Sensory level: T10 ?Events: CSF return ?Additional Notes ?Functioning IV was confirmed and monitors were applied. Sterile prep and drape, including hand hygiene and sterile gloves were used. The patient was positioned and the spine was prepped. The skin was anesthetized with lidocaine.  Free flow of clear CSF was obtained prior to injecting local anesthetic into the CSF.  The spinal needle aspirated freely following injection.  The needle was carefully withdrawn.  The patient tolerated the procedure well.  ? ? ? ?

## 2021-04-05 NOTE — Transfer of Care (Signed)
Immediate Anesthesia Transfer of Care Note ? ?Patient: FELICITY PENIX ? ?Procedure(s) Performed: Right TOTAL KNEE ARTHROPLASTY (Right: Knee) ? ?Patient Location: PACU ? ?Anesthesia Type:Spinal ? ?Level of Consciousness: awake, alert , oriented and patient cooperative ? ?Airway & Oxygen Therapy: Patient Spontanous Breathing and Patient connected to nasal cannula oxygen ? ?Post-op Assessment: Report given to RN and Post -op Vital signs reviewed and stable ? ?Post vital signs: Reviewed and stable ? ?Last Vitals:  ?Vitals Value Taken Time  ?BP 128/87 04/05/21 1406  ?Temp    ?Pulse 67 04/05/21 1409  ?Resp 15 04/05/21 1409  ?SpO2 100 % 04/05/21 1409  ?Vitals shown include unvalidated device data. ? ?Last Pain:  ?Vitals:  ? 04/05/21 1105  ?TempSrc:   ?PainSc: 10-Worst pain ever  ?   ? ?  ? ?Complications: No notable events documented. ?

## 2021-04-05 NOTE — Anesthesia Procedure Notes (Signed)
Anesthesia Regional Block: Adductor canal block  ? ?Pre-Anesthetic Checklist: , timeout performed,  Correct Patient, Correct Site, Correct Laterality,  Correct Procedure, Correct Position, site marked,  Risks and benefits discussed,  Surgical consent,  Pre-op evaluation,  At surgeon's request and post-op pain management ? ?Laterality: Right ? ?Prep: chloraprep     ?  ?Needles:  ?Injection technique: Single-shot ? ?Needle Type: Echogenic Needle   ? ? ?Needle Length: 9cm  ?Needle Gauge: 21  ? ? ? ?Additional Needles: ? ? ?Narrative:  ?Start time: 04/05/2021 11:04 AM ?End time: 04/05/2021 11:14 AM ?Injection made incrementally with aspirations every 5 mL. ? ?Performed by: Personally  ?Anesthesiologist: Achille Rich, MD ? ?Additional Notes: ?Pt tolerated the procedure well. ? ? ? ? ?

## 2021-04-06 ENCOUNTER — Telehealth: Payer: Self-pay

## 2021-04-06 DIAGNOSIS — M1711 Unilateral primary osteoarthritis, right knee: Secondary | ICD-10-CM | POA: Diagnosis not present

## 2021-04-06 LAB — CBC
HCT: 37.3 % (ref 36.0–46.0)
Hemoglobin: 12.8 g/dL (ref 12.0–15.0)
MCH: 35.7 pg — ABNORMAL HIGH (ref 26.0–34.0)
MCHC: 34.3 g/dL (ref 30.0–36.0)
MCV: 103.9 fL — ABNORMAL HIGH (ref 80.0–100.0)
Platelets: 108 10*3/uL — ABNORMAL LOW (ref 150–400)
RBC: 3.59 MIL/uL — ABNORMAL LOW (ref 3.87–5.11)
RDW: 12.4 % (ref 11.5–15.5)
WBC: 9.6 10*3/uL (ref 4.0–10.5)
nRBC: 0 % (ref 0.0–0.2)

## 2021-04-06 LAB — BASIC METABOLIC PANEL
Anion gap: 5 (ref 5–15)
BUN: 9 mg/dL (ref 8–23)
CO2: 23 mmol/L (ref 22–32)
Calcium: 10 mg/dL (ref 8.9–10.3)
Chloride: 105 mmol/L (ref 98–111)
Creatinine, Ser: 0.6 mg/dL (ref 0.44–1.00)
GFR, Estimated: >60 mL/min (ref >60)
Glucose, Bld: 167 mg/dL — ABNORMAL HIGH (ref 70–99)
Potassium: 4 mmol/L (ref 3.5–5.1)
Sodium: 133 mmol/L — ABNORMAL LOW (ref 135–145)

## 2021-04-06 MED ORDER — ASPIRIN 81 MG PO CHEW: 81.0000 mg | CHEWABLE_TABLET | Freq: Two times a day (BID) | ORAL | 1 refills | Status: DC

## 2021-04-06 MED ORDER — METHOCARBAMOL 500 MG PO TABS: 500.0000 mg | ORAL_TABLET | Freq: Four times a day (QID) | ORAL | 1 refills | Status: DC | PRN

## 2021-04-06 MED ORDER — OXYCODONE HCL 5 MG PO TABS: 5.0000 mg | ORAL_TABLET | ORAL | 0 refills | Status: DC | PRN

## 2021-04-06 NOTE — Anesthesia Postprocedure Evaluation (Signed)
Anesthesia Post Note ? ?Patient: Tracy Mercado ? ?Procedure(s) Performed: Right TOTAL KNEE ARTHROPLASTY (Right: Knee) ? ?  ? ?Patient location during evaluation: PACU ?Anesthesia Type: MAC, Spinal and Regional ?Level of consciousness: oriented and awake and alert ?Pain management: pain level controlled ?Vital Signs Assessment: post-procedure vital signs reviewed and stable ?Respiratory status: spontaneous breathing, respiratory function stable and patient connected to nasal cannula oxygen ?Cardiovascular status: blood pressure returned to baseline and stable ?Postop Assessment: no headache, no backache and no apparent nausea or vomiting ?Anesthetic complications: no ? ? ?No notable events documented. ? ?Last Vitals:  ?Vitals:  ? 04/06/21 0825 04/06/21 0826  ?BP: (!) 149/118 (!) 160/94  ?Pulse: 100 95  ?Resp: 16   ?Temp: 36.8 ?C   ?SpO2: 95%   ?  ?Last Pain:  ?Vitals:  ? 04/06/21 0647  ?TempSrc:   ?PainSc: 5   ? ? ?  ?  ?  ?  ?  ?  ? ?[place] ? ? ? ? ?

## 2021-04-06 NOTE — Discharge Summary (Signed)
?Patient ID: ?Tracy Mercado ?MRN: [id number] ?DOB/AGE: 1960/10/29 61 y.o. ? ?Admit date: 04/05/2021 ?Discharge date: 04/06/2021 ? ?Admission Diagnoses:  ?Principal Problem: ?  Unilateral primary osteoarthritis, right knee ?Active Problems: ?  Status post right knee replacement ? ? ?Discharge Diagnoses:  ?Same ? ?Past Medical History:  ?Diagnosis Date  ? Arthritis   ? CAD (coronary artery disease)   ? Hypertension   ? ? ?Surgeries: Procedure(s): ?Right TOTAL KNEE ARTHROPLASTY on 04/05/2021 ?  ?Consultants:  ? ?Discharged Condition: Improved ? ?Hospital Course: Tracy Mercado is an 61 y.o. female who was admitted 04/05/2021 for operative treatment ofUnilateral primary osteoarthritis, right knee. Patient has severe unremitting pain that affects sleep, daily activities, and work/hobbies. After pre-op clearance the patient was taken to the operating room on 04/05/2021 and underwent  Procedure(s): ?Right TOTAL KNEE ARTHROPLASTY.   ? ?Patient was given perioperative antibiotics:  ?Anti-infectives (From admission, onward)  ? ? Start     Dose/Rate Route Frequency Ordered Stop  ? 04/05/21 1800  ceFAZolin (ANCEF) IVPB 1 g/50 mL premix       ? 1 g ?100 mL/hr over 30 Minutes Intravenous Every 6 hours 04/05/21 1600 04/06/21 0914  ? 04/05/21 1000  ceFAZolin (ANCEF) IVPB 2g/100 mL premix       ? 2 g ?200 mL/hr over 30 Minutes Intravenous On call to O.R. 04/05/21 0957 04/05/21 1232  ? ?  ?  ? ?Patient was given sequential compression devices, early ambulation, and chemoprophylaxis to prevent DVT. ? ?Patient benefited maximally from hospital stay and there were no complications.   ? ?Recent vital signs: Patient Vitals for the past 24 hrs: ? BP Temp Temp src Pulse Resp SpO2  ?04/06/21 0826 (!) 160/94 -- -- 95 -- --  ?04/06/21 0825 (!) 149/118 98.2 ?F (36.8 ?C) -- 100 16 95 %  ?04/05/21 2008 129/69 -- -- 77 15 95 %  ?04/05/21 1551 (!) 163/95 98.6 ?F (37 ?C) Oral 60 14 100 %  ?04/05/21 1535 (!) 152/95 -- -- (!) 59 11 100 %  ?04/05/21 1521  (!) 169/102 -- -- -- -- --  ?04/05/21 1520 (!) 169/102 -- -- (!) 48 10 100 %  ?04/05/21 1505 (!) 176/95 -- -- (!) 53 14 99 %  ?04/05/21 1450 (!) 150/98 -- -- (!) 49 12 100 %  ?04/05/21 1435 (!) 160/93 -- -- (!) 49 (!) 9 100 %  ?04/05/21 1420 (!) 158/83 -- -- (!) 55 13 99 %  ?04/05/21 1405 128/87 (!) 97.5 ?F (36.4 ?C) -- 66 17 100 %  ?  ? ?Recent laboratory studies:  ?Recent Labs  ?  04/06/21 ?0333  ?WBC 9.6  ?HGB 12.8  ?HCT 37.3  ?PLT 108*  ?NA 133*  ?K 4.0  ?CL 105  ?CO2 23  ?BUN 9  ?CREATININE 0.60  ?GLUCOSE 167*  ?CALCIUM 10.0  ? ? ? ?Discharge Medications:   ?Allergies as of 04/06/2021   ? ?   Reactions  ? Hydrocodone Itching  ? ?  ? ?  ?Medication List  ?  ? ?TAKE these medications   ? ?aspirin 81 MG chewable tablet ?Chew 1 tablet (81 mg total) by mouth 2 (two) times daily. ?  ?ibuprofen 200 MG tablet ?Commonly known as: ADVIL ?Take 800 mg by mouth every 8 (eight) hours as needed (for pain.). ?  ?methocarbamol 500 MG tablet ?Commonly known as: ROBAXIN ?Take 1 tablet (500 mg total) by mouth every 6 (six) hours as needed for muscle spasms. ?  ?oxyCODONE 5  MG immediate release tablet ?Commonly known as: Oxy IR/ROXICODONE ?Take 1-2 tablets (5-10 mg total) by mouth every 4 (four) hours as needed for moderate pain (pain score 4-6). ?  ?Wrist Brace Misc ?APPLY LEFT AND RIGHT WRIST BRACE TO BILATERAL WRIST/FOREARM. FOR STABILITY AND SUPPORT. TO BE FITTED BY MEDICAL SUPPLY. ?  ? ?  ? ?  ?  ? ? ?  ?Durable Medical Equipment  ?(From admission, onward)  ?  ? ? ?  ? ?  Start     Ordered  ? 04/05/21 1601  DME 3 n 1  Once       ? 04/05/21 1600  ? 04/05/21 1601  DME Walker rolling  Once       ?Question Answer Comment  ?Walker: With 5 Inch Wheels   ?Patient needs a walker to treat with the following condition Status post total right knee replacement   ?  ? 04/05/21 1600  ? ?  ?  ? ?  ? ? ?Diagnostic Studies: DG Knee Right Port ? ?Result Date: 04/05/2021 ?CLINICAL DATA:  Post RIGHT knee replacement surgery EXAM: PORTABLE RIGHT KNEE  - 1-2 VIEW COMPARISON:  11/16/2020 FINDINGS: Osseous demineralization. Components of a RIGHT knee prosthesis are newly identified. No fracture, dislocation, bone destruction, or periprosthetic lucency. IMPRESSION: RIGHT knee prosthesis without acute complication. Electronically Signed   By: Ulyses Southward M.D.   On: 04/05/2021 14:38   ? ?Disposition: Discharge disposition: 01-Home or Self Care ? ? ? ? ? ? ?Discharge Instructions   ? ? Discharge patient   Complete by: As directed ?  ? Discharge disposition: 01-Home or Self Care  ? Discharge patient date: 04/06/2021  ? ?  ? ? ? Follow-up Information   ? ? Kathryne Hitch, MD Follow up in 2 week(s).   ?Specialty: Orthopedic Surgery ?Contact information: ?[address] ?[place] [address] ?[phone] ? ? ?  ?  ? ? [facility] Follow up.   ?Specialty: Home Health Services ?Why: Home health PT services will be provided by [facility] ?Contact information: ?[address] ?[address] ?[place] [address] ?[phone] ? ? ?  ?  ? ?  ?  ? ?  ? ? ? ?Signed: ?Kathryne Hitch ?04/06/2021, 12:03 PM ? ? ? ?

## 2021-04-06 NOTE — Telephone Encounter (Signed)
[facility] for pharmacist of below message from [place]  ?

## 2021-04-06 NOTE — Op Note (Signed)
NAME: Tracy Mercado, Tracy Mercado. ?MEDICAL RECORD NO: [id number] ?ACCOUNT NO: [id number] ?DATE OF BIRTH: [birth year] ?FACILITY: [facility] ?LOCATION: [facility] ?PHYSICIAN: Lind Guest. Ninfa Linden, MD ? ?Operative Report  ? ?DATE OF PROCEDURE: 04/05/2021 ? ? ?PREOPERATIVE DIAGNOSIS:  Severe end-stage arthritis and degenerative joint disease, right knee. ? ?POSTOPERATIVE DIAGNOSIS:  Severe end-stage arthritis and degenerative joint disease, right knee. ? ?PROCEDURE:  Right total knee arthroplasty. ? ?IMPLANTS:  Biomet/Zimmer Persona cemented knee system with size 8 right standard CR femur, size E right tibial tray, 12 mm medial congruent fixed bearing polyethylene insert, 32 mm patellar button. ? ?SURGEON:  Lind Guest. Ninfa Linden, MD ? ?ASSISTANT:  Erskine Emery, PA-C ? ?ANESTHESIA:   ?1.  Right lower extremity adductor canal block. ?2.  Spinal. ? ?ANTIBIOTICS:  2 g IV Ancef. ? ?TOURNIQUET TIME:  Just over 1 hour. ? ?COMPLICATIONS:  None. ? ?BLOOD LOSS:  Less than 100 mL. ? ?INDICATIONS:  The patient is a 61 year old female well known to me.  She has debilitating arthritis of her right knee and recurrent effusions.  Her x-rays show end-stage arthritis.  At this point, her right knee pain is daily and it is detrimentally  ?affecting her mobility, her quality of life and activities of daily living.  She has tried and failed all forms of conservative treatment.  She is miserable and does wish to proceed with a knee replacement.  We have recommended this as well.  We did talk ? about the risk of acute blood loss anemia, nerve or vessel injury, fracture, infection, DVT, implant failure and skin and soft tissue issues.  We talked about our goals being decreased pain, improve mobility and overall improve quality of life. ? ?DESCRIPTION OF PROCEDURE:  After informed consent was obtained, appropriate right knee was marked.  Anesthesia was obtained in the right lower extremity adductor canal block in the holding room.  She was then brought to the  operating room and sat up on  ?the operating table where spinal anesthesia was obtained.  She was then laid in supine position on the operating table.  Foley catheter was placed and a nonsterile tourniquet was placed on the upper right thigh.  Her right thigh, knee, leg, ankle and  ?foot were prepped and draped with DuraPrep and sterile drapes.  A timeout was called and she was identified as correct patient, correct right knee.  I then used Esmarch to wrap that leg and tourniquet was inflated to 300 mm of pressure.  We then made a  ?direct midline incision over the patella and carried this proximally and distally.  We dissected down the knee joint, carried out a medial parapatellar arthrotomy, finding a very large joint effusion and significant synovitis in all three compartments.   ?We evacuated the fluid from the knee and then removed the synovium from the knee.  We then put the knee in a flexed position, removing remnants of ACL, PCL, medial and lateral meniscus as well as osteophytes in all three compartments.  We then used our  ?extramedullary cutting guide for making our proximal tibia cut, setting this for a 3-degree slope correcting for varus and valgus and to take 8 mm off the high side.  We made this cut but then we backed it down 2 more millimeters.  We then used an  ?intramedullary guide for the femur for making our distal femoral cut for a right knee at 5 degrees externally rotated and 10 mm distal femoral cut, we made that cut without difficulty, and brought the  knee back down to full extension and removed more  ?debris from the back of the knee and then placed a 10 mm extension block and we achieved full extension.  We then backed the femur and put our femoral sizing guide based off the epicondylar axis.  Based off of this, we chose a size 8 femur.  We put a  ?4-in-1 cutting block for a size 8 femur, made our anterior and posterior cuts, followed by our chamfer cuts.  We placed a flexion block and we  felt like she was a little more place so the 12 mm flexion block did better and we backed down to full  ?extension and we were pleased with the 12 mm extension block.  We then went back to the tibia and chose a size E right tibial tray for coverage of the tibial plateau.  We set the rotation of the tibial tubercle and the femur and did our drill hole and  ?punched the keel punch off of this.  With a size E right trial tibia, we trialed a size 8 standard CR right femur.  We placed a 10 mm and then went up to 12 mm medial congruent polyethylene insert.  We then made our patellar cut and drilled three holes  ?for size 32 patellar button.  With all trial instrumentation in the knee, we put her through several cycles of motion.  We were pleased with range of motion and stability.  We then removed all trial instrumentation from the knee and irrigated the knee  ?with normal saline solution.  We mixed our cement on the back table and then cemented the real Biomet Zimmer size E right tibia followed by the size 8 standard femur.  We placed our 12 mm medial congruent polyethylene insert, which had really problem  ?seating.  We then cemented our patellar button.  Once the cement had hardened we had to work on adjusting our polyethylene liner and now just was not pleased with this, so we removed that size 12 and placed a brand new size 12 and it fit perfectly.  I  ?then put the knee through several cycles of motion.  We were pleased with range of motion and stability.  We then let the tourniquet down.  Hemostasis was obtained with electrocautery.  We then closed the arthrotomy with interrupted #1 Vicryl suture  ?followed by 0 Vicryl to close deep tissue and 2-0 Vicryl to close the subcutaneous tissue.  The skin was closed with staples.  Xeroform well-padded sterile dressing was applied.  She was taken to recovery room in stable condition with all final counts  ?being correct.  No complications noted.  Of note, Benita Stabile, PA-C,  assisted in the entirety of the case from opening and closing.  He was involved with soft tissue management as well as retracting of soft tissues and helping guide the implant placement.  ? He was involved in layer closure of the wound.  His assistance was medically necessary.  ? ? ? ?[place] ?D: 04/05/2021 1:46:01 pm T: 04/06/2021 12:02:00 am  ?JOB: [id number]  ?

## 2021-04-06 NOTE — Telephone Encounter (Signed)
Pharmacy called wanting clarification on filling methocarbamol due to patient just picking up Tizanidine on 04/04/2021 or is patient substituting Methocarbamol for Tizanidine.  Cb# [id number].  Please advise.  Thank you. ?

## 2021-04-06 NOTE — TOC Transition Note (Signed)
Transition of Care (TOC) - CM/SW Discharge Note ? ? ?Patient Details  ?Name: Tracy Mercado ?MRN: [id number] ?Date of Birth: 06-14-1960 ? ?Transition of Care [facility]) CM/SW Contact:  ?Epifanio Lesches, RN ?Phone Number: ?04/06/2021, 12:43 PM ? ? ?Clinical Narrative:    ?Patient will DC to: home ?Anticipated DC date: 04/06/2021 ?Family notified: yes ?Transport by: car ? ?     -s/p R TKA, 4/4 ? ?Per MD patient ready for DC today. RN, patient, patient's family, and [facility] notified of DC. Pt without Rx med concerns. Referral made with [employer] for DME rolling walker and BSC. Equipment will be delivered to bedside prior to d/c. ? ?Post hospital f/u noted on AVS. ? ?Girlfriend to provide transportation to home. ? ?RNCM will sign off for now as intervention is no longer needed. Please consult [place] again if new needs arise.  ? ? ?Final next level of care: Home w Home Health Services ?Barriers to Discharge: No Barriers Identified ? ? ?Patient Goals and CMS Choice ?  ?  ?Choice offered to / list presented to : Patient ? ?Discharge Placement ?  ?           ?  ?  ?  ?  ? ?Discharge Plan and Services ?  ?  ?           ?DME Arranged: 3-N-1, Walker rolling ?DME Agency: Other - Comment ([employer]) ?Date DME Agency Contacted: 04/06/21 ?Time DME Agency Contacted: 1005 ?Representative spoke with at DME Agency: Vaughan Basta ?HH Arranged: PT ?HH Agency: [facility] ?Date HH Agency Contacted: 04/06/21 ?Time HH Agency Contacted: 1006 ?Representative spoke with at [facility] Agency: arrange prior to surgery by MD office ? ?Social Determinants of Health (SDOH) Interventions ?  ? ? ?Readmission Risk Interventions ?   ? View : No data to display.  ?  ?  ?  ? ? ? ? ? ?

## 2021-04-06 NOTE — Progress Notes (Signed)
Subjective: ?1 Day Post-Op Procedure(s) (LRB): ?Right TOTAL KNEE ARTHROPLASTY (Right) ?Patient reports pain as moderate.   ? ?Objective: ?Vital signs in last 24 hours: ?Temp:  [97.5 ?F (36.4 ?C)-98.6 ?F (37 ?C)] 98.6 ?F (37 ?C) (04/04 1551) ?Pulse Rate:  [48-77] 77 (04/04 2008) ?Resp:  [9-20] 15 (04/04 2008) ?BP: (113-176)/(68-102) 129/69 (04/04 2008) ?SpO2:  [95 %-100 %] 95 % (04/04 2008) ?Weight:  [77.1 kg] 77.1 kg (04/04 1008) ? ?Intake/Output from previous day: ?04/04 0701 - 04/05 0700 ?In: 1614.2 [I.V.:1400.2; IV Piggyback:214] ?Out: 1150 [Urine:1100; Blood:50] ?Intake/Output this shift: ?No intake/output data recorded. ? ?Recent Labs  ?  04/06/21 ?0333  ?HGB 12.8  ? ?Recent Labs  ?  04/06/21 ?0333  ?WBC 9.6  ?RBC 3.59*  ?HCT 37.3  ?PLT 108*  ? ?Recent Labs  ?  04/06/21 ?0333  ?NA 133*  ?K 4.0  ?CL 105  ?CO2 23  ?BUN 9  ?CREATININE 0.60  ?GLUCOSE 167*  ?CALCIUM 10.0  ? ?No results for input(s): LABPT, INR in the last 72 hours. ? ?Sensation intact distally ?Intact pulses distally ?Dorsiflexion/Plantar flexion intact ?Incision: dressing C/D/I ?Compartment soft ? ? ?Assessment/Plan: ?1 Day Post-Op Procedure(s) (LRB): ?Right TOTAL KNEE ARTHROPLASTY (Right) ?Up with therapy ? ? ? ? ? ?Kathryne Hitch ?04/06/2021, 7:51 AM ? ?

## 2021-04-06 NOTE — Progress Notes (Signed)
Physical Therapy Treatment ?Patient Details ?Name: Tracy Mercado ?MRN: [id number] ?DOB: 06/22/1960 ?Today's Date: 04/06/2021 ? ? ?History of Present Illness pt is a 61 y/o female admitted 4/4 with pain in R knee and failed non-surgical conservative treatments.  Pt is s/p TKA.  PMHx: HTN, arthritis and CAD ? ?  ?PT Comments  ? ? Pt is POD # 1 and is progressing well.  She ambulated 120'x2 with supervision and performed 7 steps with bil rails.  Pt with fair/tolerable pain control, good flexion , some difficulty with terminal extension, and good quad activation. Pt demonstrates safe gait & transfers in order to return home from PT perspective once discharged by MD.  While in hospital, will continue to benefit from PT for skilled therapy to advance mobility and exercises.   ?   ?Recommendations for follow up therapy are one component of a multi-disciplinary discharge planning process, led by the attending physician.  Recommendations may be updated based on patient status, additional functional criteria and insurance authorization. ? ?Follow Up Recommendations ? Follow physician's recommendations for discharge plan and follow up therapies ?  ?  ?Assistance Recommended at Discharge Set up Supervision/Assistance  ?Patient can return home with the following A little help with bathing/dressing/bathroom;Assistance with cooking/housework;Assist for transportation;Help with stairs or ramp for entrance ?  ?Equipment Recommendations ? Rolling walker (2 wheels);BSC/3in1  ?  ?Recommendations for Other Services   ? ? ?  ?Precautions / Restrictions Precautions ?Precautions: Fall ?Knee Immobilizer - Right: Discontinue once straight leg raise with < 10 degree lag ?Restrictions ?Other Position/Activity Restrictions: WBAT  ?  ? ?Mobility ? Bed Mobility ?Overal bed mobility: Needs Assistance ?Bed Mobility: Supine to Sit, Sit to Supine ?  ?  ?Supine to sit: Supervision ?Sit to supine: Supervision ?  ?General bed mobility comments:  increased time ?  ? ?Transfers ?Overall transfer level: Needs assistance ?Equipment used: Rolling walker (2 wheels) ?Transfers: Sit to/from Stand ?Sit to Stand: Min guard, Supervision ?  ?  ?  ?  ?  ?General transfer comment: Min guard progressing to supervision; cues for R LE management and hand placement ?  ? ?Ambulation/Gait ?Ambulation/Gait assistance: Supervision ?Gait Distance (Feet): 120 Feet (120'x2) ?Assistive device: Rolling walker (2 wheels) ?Gait Pattern/deviations: Step-through pattern, Antalgic, Decreased weight shift to right ?Gait velocity: decreased ?  ?  ?General Gait Details: Antalgic pattern with initial cues for RW proximity ? ? ?Stairs ? Up/down 7 steps with bil rails and min guard. Educated on sequencing then did not need cues.  ?  ?  ?  ?  ? ? ?Wheelchair Mobility ?  ? ?Modified Rankin (Stroke Patients Only) ?  ? ? ?  ?Balance Overall balance assessment: Needs assistance ?Sitting-balance support: No upper extremity supported, Feet supported ?Sitting balance-Leahy Scale: Good ?  ?  ?Standing balance support: Bilateral upper extremity supported, No upper extremity supported ?Standing balance-Leahy Scale: Fair ?Standing balance comment: RW to ambulate; could static stand no AD ?  ?  ?  ?  ?  ?  ?  ?  ?  ?  ?  ?  ? ?  ?Cognition Arousal/Alertness: Awake/alert ?Behavior During Therapy: [facility] for tasks assessed/performed ?Overall Cognitive Status: Within Functional Limits for tasks assessed ?  ?  ?  ?  ?  ?  ?  ?  ?  ?  ?  ?  ?  ?  ?  ?  ?  ?  ?  ? ?  ?Exercises Total Joint Exercises ?Ankle Circles/Pumps: AROM,  Both, 10 reps, Supine ?Quad Sets: AROM, Both, 5 reps, Supine (cues for quad contraction with 5 sec hold) ?Heel Slides: AAROM, 5 reps, Supine, Right (to tolerance, educated on AAROM with belt) ?Hip ABduction/ADduction: AAROM, Right, Supine, 5 reps (to tolerance, educated on AAROM with belt) ?Long Arc Quad: AROM, Right, 5 reps, Seated ?Knee Flexion: AROM, Right, 5 reps, Seated ?Goniometric ROM:  R knee 10 to 90 degrees ? ?  ?General Comments General comments (skin integrity, edema, etc.): VSS ? ?Educated on safe ice use, no pivots, car transfers, resting with leg straight, and TED hose during day. Also, encouraged walking every 1-2 hours during day. Educated on HEP with focus on mobility the first weeks. Discussed doing exercises within pain control and if pain increasing could decreased ROM, reps, and stop exercises as needed. Encouraged to perform quad sets and ankle pumps frequently for blood flow and to promote full knee extension. ?  ?  ? ?Pertinent Vitals/Pain Pain Assessment ?Pain Assessment: 0-10 ?Pain Score: 5  ?Pain Location: R knee ?Pain Descriptors / Indicators: Discomfort, Sore ?Pain Intervention(s): Limited activity within patient's tolerance, Monitored during session, Premedicated before session, Ice applied  ? ? ?Home Living   ?  ?  ?  ?  ?  ?  ?  ?  ?  ?   ?  ?Prior Function    ?  ?  ?   ? ?PT Goals (current goals can now be found in the care plan section) Progress towards PT goals: Progressing toward goals ? ?  ?Frequency ? ? ? 7X/week ? ? ? ?  ?PT Plan Current plan remains appropriate  ? ? ?Co-evaluation   ?  ?  ?  ?  ? ?  ?AM-PAC PT "6 Clicks" Mobility   ?Outcome Measure ? Help needed turning from your back to your side while in a flat bed without using bedrails?: None ?Help needed moving from lying on your back to sitting on the side of a flat bed without using bedrails?: A Little ?Help needed moving to and from a bed to a chair (including a wheelchair)?: A Little ?Help needed standing up from a chair using your arms (e.g., wheelchair or bedside chair)?: A Little ?Help needed to walk in hospital room?: A Little ?Help needed climbing 3-5 steps with a railing? : A Little ?6 Click Score: 19 ? ?  ?End of Session Equipment Utilized During Treatment: Gait belt ?Activity Tolerance: Patient tolerated treatment well ?Patient left: in bed;with call bell/phone within reach;with family/visitor  present;with bed alarm set ?Nurse Communication: Mobility status ?PT Visit Diagnosis: Other abnormalities of gait and mobility (R26.89);Ataxic gait (R26.0);Pain ?Pain - Right/Left: Right ?Pain - part of body: Knee ?  ? ? ?Time: 9417-4081 ?PT Time Calculation (min) (ACUTE ONLY): 29 min ? ?Charges:  $Gait Training: 8-22 mins ?$Therapeutic Exercise: 8-22 mins          ?          ? ?Anise Salvo, PT ?Acute Rehab Services ?Pager [phone] ?[facility] [phone] ? ? ? ?Tracy Mercado ?04/06/2021, 12:23 PM ? ?

## 2021-04-06 NOTE — Discharge Instructions (Signed)

## 2021-04-07 ENCOUNTER — Encounter (HOSPITAL_COMMUNITY): Payer: Self-pay | Admitting: Orthopaedic Surgery

## 2021-04-12 ENCOUNTER — Other Ambulatory Visit: Payer: Self-pay | Admitting: Orthopaedic Surgery

## 2021-04-12 MED ORDER — TIZANIDINE HCL 4 MG PO TABS: 4.0000 mg | ORAL_TABLET | Freq: Three times a day (TID) | ORAL | 0 refills | Status: DC | PRN

## 2021-04-12 MED ORDER — OXYCODONE HCL 5 MG PO TABS: 5.0000 mg | ORAL_TABLET | ORAL | 0 refills | Status: DC | PRN

## 2021-04-12 NOTE — Telephone Encounter (Signed)
Wife of Tracy Mercado called requesting a pain medication refill of Oxycodone 5mg  immediate relief tabs. Patient is also requesting to be prescribed Tizanidine instead of Robaxin. She has better results.  ?

## 2021-04-18 ENCOUNTER — Ambulatory Visit (INDEPENDENT_AMBULATORY_CARE_PROVIDER_SITE_OTHER): Payer: 59 | Admitting: Orthopaedic Surgery

## 2021-04-18 ENCOUNTER — Encounter: Payer: Self-pay | Admitting: Orthopaedic Surgery

## 2021-04-18 DIAGNOSIS — Z96651 Presence of right artificial knee joint: Secondary | ICD-10-CM

## 2021-04-18 MED ORDER — OXYCODONE HCL 5 MG PO TABS: 5.0000 mg | ORAL_TABLET | ORAL | 0 refills | Status: DC | PRN

## 2021-04-18 NOTE — Progress Notes (Signed)
The patient is 2 weeks tomorrow status post a right knee replacement.  She does report improving range of motion and strength and is doing okay overall.  She does need a refill appropriately of her pain medication.  She has been on aspirin twice a day.  She does have notes from therapy stating that they have been able to flex her in the past 90 degrees. ? ?Examination of her right knee shows a well health surgical incision with staples been removed and Steri-Strips applied.  Her calf is soft.  She lacks full extension by few degrees but I can flex her to past 90 degrees which is a good reflection of how well she is pushing herself. ? ?We will set her up at this point for outpatient physical therapy to work on range of motion and strengthening of the knee.  I will refill her pain medication.  She can stop her aspirin.  We will see her back in 4 weeks to see how she is doing overall but no x-rays are needed. ?

## 2021-04-19 ENCOUNTER — Other Ambulatory Visit: Payer: Self-pay

## 2021-04-19 ENCOUNTER — Telehealth: Payer: Self-pay | Admitting: Orthopaedic Surgery

## 2021-04-19 DIAGNOSIS — Z96651 Presence of right artificial knee joint: Secondary | ICD-10-CM

## 2021-04-19 NOTE — Telephone Encounter (Signed)
25.00 received to complete FMLA forms for pt ?

## 2021-04-22 ENCOUNTER — Encounter: Payer: Self-pay | Admitting: Physical Therapy

## 2021-04-22 ENCOUNTER — Ambulatory Visit: Payer: 59 | Admitting: Physical Therapy

## 2021-04-22 ENCOUNTER — Other Ambulatory Visit: Payer: Self-pay | Admitting: Orthopaedic Surgery

## 2021-04-22 DIAGNOSIS — R600 Localized edema: Secondary | ICD-10-CM

## 2021-04-22 DIAGNOSIS — M6281 Muscle weakness (generalized): Secondary | ICD-10-CM

## 2021-04-22 DIAGNOSIS — M25661 Stiffness of right knee, not elsewhere classified: Secondary | ICD-10-CM | POA: Diagnosis not present

## 2021-04-22 DIAGNOSIS — R2689 Other abnormalities of gait and mobility: Secondary | ICD-10-CM

## 2021-04-22 DIAGNOSIS — M25561 Pain in right knee: Secondary | ICD-10-CM | POA: Diagnosis not present

## 2021-04-22 NOTE — Therapy (Signed)
?OUTPATIENT PHYSICAL THERAPY LOWER EXTREMITY EVALUATION ? ? ?Patient Name: Tracy Mercado ?MRN: [id number] ?DOB:1960/08/01, 61 y.o., female ?Today's Date: 04/22/2021 ? ? PT End of Session - 04/22/21 1057   ? ? Visit Number 1   ? Number of Visits 16   ? Date for PT Re-Evaluation 07/01/21   ? PT Start Time 1100   ? PT Stop Time 1135   ? PT Time Calculation (min) 35 min   ? Activity Tolerance Patient tolerated treatment well   ? Behavior During Therapy [facility] for tasks assessed/performed   ? ?  ?  ? ?  ? ? ?Past Medical History:  ?Diagnosis Date  ? Arthritis   ? CAD (coronary artery disease)   ? Hypertension   ? ?Past Surgical History:  ?Procedure Laterality Date  ? HAND SURGERY Left   ? KNEE ARTHROSCOPY    ? TOTAL KNEE ARTHROPLASTY Right 04/05/2021  ? Procedure: Right TOTAL KNEE ARTHROPLASTY;  Surgeon: Kathryne Hitch, MD;  Location: [facility] OR;  Service: Orthopedics;  Laterality: Right;  ? ?Patient Active Problem List  ? Diagnosis Date Noted  ? Status post right knee replacement 04/05/2021  ? Unilateral primary osteoarthritis, right knee 01/10/2021  ? ? ?PCP: Patient, No Pcp Per (Inactive) ? ?REFERRING PROVIDER: Kathryne Hitch* ? ?REFERRING DIAG: [phone] (ICD-10-CM) - Status post right knee replacement ? ?THERAPY DIAG:  ?Acute pain of right knee ? ?Stiffness of right knee, not elsewhere classified ? ?Muscle weakness (generalized) ? ?Other abnormalities of gait and mobility ? ?Localized edema ? ?ONSET DATE: S/P Rt TKA 04/05/21 ? ?SUBJECTIVE:  ? ?SUBJECTIVE STATEMENT: ?She had Rt knee replaced. She had home health PT She is having some pain and swelling and not sleeping well  ? ?PERTINENT HISTORY: ?See above PMH ? ?PAIN:  ?Are you having pain? Yes: NPRS scale: 8/10 ?Pain location: Rt anterior knee ?Pain description: constant dull pain with sharp random pain ?Aggravating factors: bending and straightening, trying to sleep ?Relieving factors: meds, sometimes ice ? ?PRECAUTIONS: None ? ?WEIGHT BEARING RESTRICTIONS  No ? ?FALLS:  ?Has patient fallen in last 6 months? No ? ?OCCUPATION: International aid/development worker at [employer] ? ?PLOF: Independent ? ?PATIENT GOALS : to walk without cane, stand and walk longer, sleep better, reduce pain ? ? ?OBJECTIVE:  ? ?DIAGNOSTIC FINDINGS: Rt knee XR IMPRESSION: RIGHT knee prosthesis without acute complication. ? ?PATIENT SURVEYS:  ?FOTO 29% functional ? ?COGNITION: ? Overall cognitive status: Within functional limits for tasks assessed   ?  ?SENSATION: ?Light touch intact ?Tightness noted in hamstrings and quads with moderate edema present ? ?POSTURE: slumped posture ? ? ?PALPATION: ?TTP anterior knee ? ?LE ROM: ? ? AROM/PROM Right ?04/22/2021 Left ?04/22/2021  ?Hip flexion    ?Hip extension    ?Hip abduction    ?Hip adduction    ?Hip internal rotation    ?Hip external rotation    ?Knee flexion 96/103   ?Knee extension 10/7   ?Ankle dorsiflexion    ?Ankle plantarflexion    ?Ankle inversion    ?Ankle eversion    ? (Blank rows = not tested) ? ?LE MMT: ? ?MMT Right ?04/22/2021 Left ?04/22/2021  ?Hip flexion 4   ?Hip extension    ?Hip abduction 4   ?Hip adduction 4-   ?Hip internal rotation    ?Hip external rotation    ?Knee flexion 4   ?Knee extension 4   ?Ankle dorsiflexion    ?Ankle plantarflexion    ?Ankle inversion    ?Ankle eversion    ? (  Blank rows = not tested) ? ?GAIT: ?Distance walked: 100 ft ?Assistive device utilized: Single point cane ?Level of assistance: Modified independence ?Comments: antalgic gait on Rt ? ? ? ?TODAY'S TREATMENT: ?04/22/21 ?Reviewed HEP and performed one set of each listed below ?manual therapy for Rt knee PROM to tolerance ?Nu step X 5 min L4 UE/LE ? ? ?PATIENT EDUCATION:  ?Education details: HEP,POC ?Person educated: Patient ?Education method: Explanation, Demonstration, Verbal cues, and Handouts ?Education comprehension: verbalized understanding, returned demonstration, and needs further education ? ? ?HOME EXERCISE PROGRAM: ?Access Code: [email or web address] ?URL:  [email or web address] ?Date: 04/22/2021 ?Prepared by: Ivery Quale ? ?Exercises ?- Seated Hamstring Stretch  - 2 x daily - 6 x weekly - 1 sets - 2 reps - 30 hold ?- Seated Straight Leg Raise with Quad Contraction  - 2 x daily - 6 x weekly - 1-2 sets - 10 reps ?- Seated Long Arc Quad  - 2 x daily - 6 x weekly - 2 sets - 10 reps ?- Seated Knee Flexion Stretch  - 2 x daily - 6 x weekly - 1-2 sets - 10 reps - 5 sec hold ? ? ? ?ASSESSMENT: ? ?CLINICAL IMPRESSION: Patient presents with Rt knee pain, stiffness, edema, weakness, and difficulty walking S/P Rt TKA on 04/05/21. Patient will benefit from skilled PT to address below impairments, limitations and improve overall function. ? ?OBJECTIVE IMPAIRMENTS: decreased activity tolerance, difficulty walking, decreased balance, decreased endurance, decreased mobility, decreased ROM, decreased strength, impaired flexibility, impaired LE use, postural dysfunction, and pain. ? ?ACTIVITY LIMITATIONS: bending, lifting, carry, locomotion, cleaning, community activity, driving, and or occupation ? ?PERSONAL FACTORS:  Rt TKA, HTN, arthritis and CAD are also affecting patient's functional outcome. ? ?REHAB POTENTIAL: Good ? ?CLINICAL DECISION MAKING: Stable/uncomplicated ? ?EVALUATION COMPLEXITY: Low ? ? ? ?GOALS: ?Short term PT Goals Target date: 05/20/2021 ?Pt will be I and compliant with HEP. ?Baseline:  ?Goal status: New ?Pt will decrease pain by 25% overall ?Baseline: ?Goal status: New ? ?Long term PT goals Target date: 07/01/2021 ?Pt will improve Rt knee PROM 2-115 deg to improve functional mobility ?Baseline: ?Goal status: New ?Pt will improve  Rt hip/knee strength to at least 5-/5 MMT to improve functional strength ?Baseline: ?Goal status: New ?Pt will improve FOTO to at least 55% functional to show improved function ?Baseline: ?Goal status: New ?Pt will reduce pain by overall 50% overall with usual activity ?Baseline: ?Goal status: New ?Pt will be able to ambulate  community distances at least 1000 ft WNL gait pattern without complaints ?Baseline: ?Goal status: New ? ?PLAN: ?PT FREQUENCY: 1-2 times per week  ? ?PT DURATION: 10 weeks ? ?PLANNED INTERVENTIONS (unless contraindicated): aquatic PT, Canalith repositioning, cryotherapy, Electrical stimulation, Iontophoresis with 4 mg/ml dexamethasome, Moist heat, traction, Ultrasound, gait training, Therapeutic exercise, balance training, neuromuscular re-education, patient/family education, prosthetic training, manual techniques, passive ROM, dry needling, taping, vasopnuematic device, vestibular, spinal manipulations, joint manipulations ? ?PLAN FOR NEXT SESSION: review HEP, knee ROM FOCUS and quad activation, nu step for ROM, vaso if desired ? ? ? ?April Manson, PT,DPT ?04/22/2021, 11:31 AM ? ?

## 2021-04-25 ENCOUNTER — Encounter: Payer: Self-pay | Admitting: Physical Therapy

## 2021-04-25 ENCOUNTER — Telehealth: Payer: Self-pay | Admitting: Orthopaedic Surgery

## 2021-04-25 ENCOUNTER — Other Ambulatory Visit: Payer: Self-pay | Admitting: Orthopaedic Surgery

## 2021-04-25 ENCOUNTER — Ambulatory Visit (INDEPENDENT_AMBULATORY_CARE_PROVIDER_SITE_OTHER): Payer: 59 | Admitting: Physical Therapy

## 2021-04-25 DIAGNOSIS — M25561 Pain in right knee: Secondary | ICD-10-CM | POA: Diagnosis not present

## 2021-04-25 DIAGNOSIS — M6281 Muscle weakness (generalized): Secondary | ICD-10-CM

## 2021-04-25 DIAGNOSIS — M25661 Stiffness of right knee, not elsewhere classified: Secondary | ICD-10-CM

## 2021-04-25 DIAGNOSIS — R600 Localized edema: Secondary | ICD-10-CM | POA: Diagnosis not present

## 2021-04-25 DIAGNOSIS — R2689 Other abnormalities of gait and mobility: Secondary | ICD-10-CM

## 2021-04-25 MED ORDER — OXYCODONE HCL 5 MG PO TABS: 5.0000 mg | ORAL_TABLET | ORAL | 0 refills | Status: DC | PRN

## 2021-04-25 NOTE — Telephone Encounter (Signed)
Please advise 

## 2021-04-25 NOTE — Telephone Encounter (Signed)
Patient would like oxycodone called in for her. Call back number is [phone] ?

## 2021-04-25 NOTE — Therapy (Addendum)
?OUTPATIENT PHYSICAL THERAPY TREATMENT NOTE ? ? ?Patient Name: Tracy Mercado ?MRN: [id number] ?DOB:February 17, 1960, 61 y.o., female ?Today's Date: 04/25/2021 ? ?PCP: Patient, No Pcp Per (Inactive) ?REFERRING PROVIDER: Kathryne Hitch* ? ?END OF SESSION:  ? PT End of Session - 04/25/21 0947   ? ? Visit Number 2   ? Number of Visits 16   ? Date for PT Re-Evaluation 07/01/21   ? PT Start Time [phone]   ? PT Stop Time 1020   ? PT Time Calculation (min) 39 min   ? Equipment Utilized During Treatment Gait belt   ? Activity Tolerance Patient tolerated treatment well   ? Behavior During Therapy [facility] for tasks assessed/performed   ? ?  ?  ? ?  ? ? ?Past Medical History:  ?Diagnosis Date  ? Arthritis   ? CAD (coronary artery disease)   ? Hypertension   ? ?Past Surgical History:  ?Procedure Laterality Date  ? HAND SURGERY Left   ? KNEE ARTHROSCOPY    ? TOTAL KNEE ARTHROPLASTY Right 04/05/2021  ? Procedure: Right TOTAL KNEE ARTHROPLASTY;  Surgeon: Kathryne Hitch, MD;  Location[facility] OR;  Service: Orthopedics;  Laterality: Right;  ? ?Patient Active Problem List  ? Diagnosis Date Noted  ? Status post right knee replacement 04/05/2021  ? Unilateral primary osteoarthritis, right knee 01/10/2021  ? ? ?REFERRING DIAG: Z96.651 (ICD-10-CM) - Status post right knee replacement ? ?THERAPY DIAG:  ?Acute pain of right knee ? ?Stiffness of right knee, not elsewhere classified ? ?Muscle weakness (generalized) ? ?Localized edema ? ?Other abnormalities of gait and mobility ? ?PERTINENT HISTORY: see above PMH ? ?PRECAUTIONS: none ? ?SUBJECTIVE: Pt arriving reporting 5/10 pain in Rt knee. Pt reporting difficulty sleeping at night due to pain. Pt is trying to wean off prescription pain meds.  ? ?PAIN:  ?Are you having pain? Yes: NPRS scale: 5/10 ?Pain location: Rt knee ?Pain description: achy, stiffness ?Aggravating factors: bending, sleeping  ?Relieving factors: take pain meds ? ? ?OBJECTIVE: (objective measures completed at initial  evaluation unless otherwise dated) ? ?DIAGNOSTIC FINDINGS: Rt knee XR IMPRESSION: RIGHT knee prosthesis without acute complication. ?  ?PATIENT SURVEYS:  ?FOTO 29% functional ?  ?COGNITION: ?          Overall cognitive status: Within functional limits for tasks assessed               ?           ?SENSATION: ?Light touch intact ?Tightness noted in hamstrings and quads with moderate edema present ?  ?POSTURE: slumped posture ?  ?  ?PALPATION: ?TTP anterior knee ?  ?LE ROM: ?  ? AROM/PROM Right ?04/22/2021 Rt ?04/25/21  ?Hip flexion      ?Hip extension      ?Hip abduction      ?Hip adduction      ?Hip internal rotation      ?Hip external rotation      ?Knee flexion 96/103 100/110   ?Knee extension 10/7 12/8   ?Ankle dorsiflexion      ?Ankle plantarflexion      ?Ankle inversion      ?Ankle eversion      ? (Blank rows = not tested) ?  ?LE MMT: ?  ?MMT Right ?04/22/2021   ?Hip flexion 4    ?Hip extension      ?Hip abduction 4    ?Hip adduction 4-    ?Hip internal rotation      ?Hip external rotation      ?  Knee flexion 4    ?Knee extension 4    ?Ankle dorsiflexion      ?Ankle plantarflexion      ?Ankle inversion      ?Ankle eversion      ? (Blank rows = not tested) ?  ?GAIT: ?Distance walked: 100 ft ?Assistive device utilized: Single point cane ?Level of assistance: Modified independence ?Comments: antalgic gait on Rt ?  ?  ?  ?TODAY'S TREATMENT: ?04/25/21: ?TherEX ?Nustep: L4 x 8 minutes ?LAQ: 3# 2 x 10 ?SLR: 2 x 10 (quad set before lift) ?Leg Press 50# bilateral LE's 3 x 10 ?Calf stretch: 2 x 30 seconds on slant board ?Step ups 6 inch x 15 with UE support ?Manual:  ?PROM: flexion and overpressure with extension ?Modalities:  ?Vasopneumatic: medium compression, 34 degrees x 10 minutes ? ? ? ? ?04/22/21 ?Reviewed HEP and performed one set of each listed below ?manual therapy for Rt knee PROM to tolerance ?Nu step X 5 min L4 UE/LE ?  ?  ?PATIENT EDUCATION:  ?Education details: HEP,POC ?Person educated: Patient ?Education method:  Explanation, Demonstration, Verbal cues, and Handouts ?Education comprehension: verbalized understanding, returned demonstration, and needs further education ?  ?  ?HOME EXERCISE PROGRAM: ?Access Code: [email or web address] ?URL: [email or web address] ?Date: 04/22/2021 ?Prepared by: Ivery QualeBrian Nelson ?  ?Exercises ?- Seated Hamstring Stretch  - 2 x daily - 6 x weekly - 1 sets - 2 reps - 30 hold ?- Seated Straight Leg Raise with Quad Contraction  - 2 x daily - 6 x weekly - 1-2 sets - 10 reps ?- Seated Long Arc Quad  - 2 x daily - 6 x weekly - 2 sets - 10 reps ?- Seated Knee Flexion Stretch  - 2 x daily - 6 x weekly - 1-2 sets - 10 reps - 5 sec hold ?  ?  ?  ?ASSESSMENT: ?  ?CLINICAL IMPRESSION:  ?Pt reporting 5/10 pain upon arrival. Pt compliant in her HEP. Pt tolerating exercises well with quad fatigue noted with toward end of session. Continue with skilled PT to maximize pt's function.  ? ? ?  ?OBJECTIVE IMPAIRMENTS: decreased activity tolerance, difficulty walking, decreased balance, decreased endurance, decreased mobility, decreased ROM, decreased strength, impaired flexibility, impaired LE use, postural dysfunction, and pain. ?  ?ACTIVITY LIMITATIONS: bending, lifting, carry, locomotion, cleaning, community activity, driving, and or occupation ?  ?PERSONAL FACTORS:  Rt TKA, HTN, arthritis and CAD are also affecting patient's functional outcome. ?  ?REHAB POTENTIAL: Good ?  ?CLINICAL DECISION MAKING: Stable/uncomplicated ?  ?EVALUATION COMPLEXITY: Low ?  ?  ?  ?GOALS: ?Short term PT Goals Target date: 05/20/2021 ?Pt will be I and compliant with HEP. ?Baseline:  ?Goal status: On-going 04/25/2021 ?Pt will decrease pain by 25% overall ?Baseline: ?Goal status: On-going 04/25/2021 ?  ?Long term PT goals Target date: 07/01/2021 ?Pt will improve Rt knee PROM 2-115 deg to improve functional mobility ?Baseline: ?Goal status: New ?Pt will improve  Rt hip/knee strength to at least 5-/5 MMT to improve functional  strength ?Baseline: ?Goal status: New ?Pt will improve FOTO to at least 55% functional to show improved function ?Baseline: ?Goal status: New ?Pt will reduce pain by overall 50% overall with usual activity ?Baseline: ?Goal status: New ?Pt will be able to ambulate community distances at least 1000 ft WNL gait pattern without complaints ?Baseline: ?Goal status: New ?  ?PLAN: ?PT FREQUENCY: 1-2 times per week  ?  ?PT DURATION: 10 weeks ?  ?PLANNED INTERVENTIONS (unless contraindicated): aquatic PT, Canalith  repositioning, cryotherapy, Electrical stimulation, Iontophoresis with 4 mg/ml dexamethasome, Moist heat, traction, Ultrasound, gait training, Therapeutic exercise, balance training, neuromuscular re-education, patient/family education, prosthetic training, manual techniques, passive ROM, dry needling, taping, vasopnuematic device, vestibular, spinal manipulations, joint manipulations ?  ?PLAN FOR NEXT SESSION: knee ROM FOCUS and quad activation, bike, vaso, gait training to wean from cane ? ? ? ? ?Sharmon Leyden, PT, MPT ?04/25/2021, 10:01 AM ? ?  ? ?

## 2021-04-26 ENCOUNTER — Ambulatory Visit (INDEPENDENT_AMBULATORY_CARE_PROVIDER_SITE_OTHER): Payer: 59 | Admitting: Physical Therapy

## 2021-04-26 ENCOUNTER — Encounter: Payer: Self-pay | Admitting: Physical Therapy

## 2021-04-26 DIAGNOSIS — M25561 Pain in right knee: Secondary | ICD-10-CM | POA: Diagnosis not present

## 2021-04-26 DIAGNOSIS — R600 Localized edema: Secondary | ICD-10-CM

## 2021-04-26 DIAGNOSIS — M25661 Stiffness of right knee, not elsewhere classified: Secondary | ICD-10-CM

## 2021-04-26 DIAGNOSIS — M6281 Muscle weakness (generalized): Secondary | ICD-10-CM

## 2021-04-26 DIAGNOSIS — R2689 Other abnormalities of gait and mobility: Secondary | ICD-10-CM

## 2021-04-26 NOTE — Therapy (Signed)
?OUTPATIENT PHYSICAL THERAPY TREATMENT NOTE ? ? ?Patient Name: Tracy Mercado ?MRN: [id number] ?DOB:09/26/1960, 61 y.o., female ?Today's Date: 04/26/2021 ? ?PCP: Patient, No Pcp Per (Inactive) ?REFERRING PROVIDER: Mcarthur Rossetti* ? ?END OF SESSION:  ? PT End of Session - 04/26/21 1642   ? ? Visit Number 3   ? Number of Visits 16   ? Date for PT Re-Evaluation 07/01/21   ? PT Start Time 1600   ? PT Stop Time [id number]   ? PT Time Calculation (min) 50 min   ? Activity Tolerance Patient tolerated treatment well   ? Behavior During Therapy [facility] for tasks assessed/performed   ? ?  ?  ? ?  ? ? ? ?Past Medical History:  ?Diagnosis Date  ? Arthritis   ? CAD (coronary artery disease)   ? Hypertension   ? ?Past Surgical History:  ?Procedure Laterality Date  ? HAND SURGERY Left   ? KNEE ARTHROSCOPY    ? TOTAL KNEE ARTHROPLASTY Right 04/05/2021  ? Procedure: Right TOTAL KNEE ARTHROPLASTY;  Surgeon: Mcarthur Rossetti, MD;  Location: [facility];  Service: Orthopedics;  Laterality: Right;  ? ?Patient Active Problem List  ? Diagnosis Date Noted  ? Status post right knee replacement 04/05/2021  ? Unilateral primary osteoarthritis, right knee 01/10/2021  ? ? ?REFERRING DIAG: [id number] (ICD-10-CM) - Status post right knee replacement ? ?THERAPY DIAG:  ?Acute pain of right knee ? ?Stiffness of right knee, not elsewhere classified ? ?Muscle weakness (generalized) ? ?Other abnormalities of gait and mobility ? ?Localized edema ? ?PERTINENT HISTORY: see above PMH ? ?PRECAUTIONS: none ? ?SUBJECTIVE: Pt arriving reporting 2/10 pain in Rt knee.  ? ?PAIN:  ?Are you having pain? Yes: NPRS scale: 2/10 ?Pain location: Rt knee ?Pain description: achy, stiffness ?Aggravating factors: bending, sleeping  ?Relieving factors: take pain meds ? ? ?OBJECTIVE: (objective measures completed at initial evaluation unless otherwise dated) ? ?DIAGNOSTIC FINDINGS: Rt knee XR IMPRESSION: RIGHT knee prosthesis without acute complication. ?  ?PATIENT SURVEYS:   ?FOTO 29% functional ?  ?COGNITION: ?          Overall cognitive status: Within functional limits for tasks assessed               ?           ?SENSATION: ?Light touch intact ?Tightness noted in hamstrings and quads with moderate edema present ?  ?POSTURE: slumped posture ?  ?  ?PALPATION: ?TTP anterior knee ?  ?LE ROM: ?  ? AROM/PROM Right ?04/22/2021 Rt ?04/25/21 Rt ?04/26/21  ?Hip flexion       ?Hip extension       ?Hip abduction       ?Hip adduction       ?Hip internal rotation       ?Hip external rotation       ?Knee flexion 96/103 100/110  104/109  ?Knee extension 10/7 12/8  10/8  ?Ankle dorsiflexion       ?Ankle plantarflexion       ?Ankle inversion       ?Ankle eversion       ? (Blank rows = not tested) ?  ?LE MMT: ?  ?MMT Right ?04/22/2021   ?Hip flexion 4    ?Hip extension      ?Hip abduction 4    ?Hip adduction 4-    ?Hip internal rotation      ?Hip external rotation      ?Knee flexion 4    ?Knee extension  4    ?Ankle dorsiflexion      ?Ankle plantarflexion      ?Ankle inversion      ?Ankle eversion      ? (Blank rows = not tested) ?  ?GAIT: ?Distance walked: 100 ft ?Assistive device utilized: Single point cane ?Level of assistance: Modified independence ?Comments: antalgic gait on Rt ?  ?  ?  ?TODAY'S TREATMENT: ?04/26/21: ?TherEX ?Recumbent bike: seat at 8, L3 x 8 minutes ?LAQ: 3# x15 ?SLR seated: 2 x 10 (quad set before lift) ?Leg Press: 75# 2 x 10 bilateral LE's ?Leg Press: 37# 2 x 10 Rt LE only,  ?Calf stretch: 2 x 30 seconds on slant board ?Step ups 6 inch x 15 with UE support ?Standing on Airex mat: feet together 3 x 20 seconds with intermittent support ?F[employer]rist: x 2 minutes ?Amb: 200 feet on level surface with no device ?Manual:  ?PROM: flexion and overpressure with extension ?Modalities:  ?Vasopneumatic: medium compression, 34 degrees x 10 minutes ? ? ?04/25/21: ?TherEX ?Nustep: L4 x 8 minutes ?LAQ: 3# 2 x 10 ?SLR: 2 x 10 (quad set before lift) ?Leg Press ?Calf stretch: 2 x 30 seconds  on slant board ?Step ups 6 inch x 15 with UE support ?Manual:  ?PROM: flexion and overpressure with extension ?Modalities:  ?Vasopneumatic: medium compression, 34 degrees x 10 minutes ? ? ? ? ?04/22/21 ?Reviewed HEP and performed one set of each listed below ?manual therapy for Rt knee PROM to tolerance ?Nu step X 5 min L4 UE/LE ?  ?  ?PATIENT EDUCATION:  ?04/26/2021 ?Education details: Pt instructed to begin walking on level surfaces with no device, gt training performed ?Person educated: Patient ?Education method: Explanation, Demonstration, Verbal cues, and Handouts ?Education comprehension: verbalized understanding, returned demonstration, and needs further education ?  ?  ?HOME EXERCISE PROGRAM: ?Access Code: [place] ?URL: [email or web address] ?Date: 04/22/2021 ?Prepared by: Elsie Ra ?  ?Exercises ?- Seated Hamstring Stretch  - 2 x daily - 6 x weekly - 1 sets - 2 reps - 30 hold ?- Seated Straight Leg Raise with Quad Contraction  - 2 x daily - 6 x weekly - 1-2 sets - 10 reps ?- Seated Long Arc Quad  - 2 x daily - 6 x weekly - 2 sets - 10 reps ?- Seated Knee Flexion Stretch  - 2 x daily - 6 x weekly - 1-2 sets - 10 reps - 5 sec hold ?  ?  ?  ?ASSESSMENT: ?  ?CLINICAL IMPRESSION: Pt reporting 2/10 pain in Rt knee upon arrival. Pt tolerating all exercises well with improvements noted in increased wt on Leg Press as well as improvements with walking on level surfaces. Recommended pt to begin walking on level surfaces with no device. Pt's PROM in Rt knee flexion is 109 degrees. Continue with skilled PT to maximize pt's function.  ? ? ?  ?OBJECTIVE IMPAIRMENTS: decreased activity tolerance, difficulty walking, decreased balance, decreased endurance, decreased mobility, decreased ROM, decreased strength, impaired flexibility, impaired LE use, postural dysfunction, and pain. ?  ?ACTIVITY LIMITATIONS: bending, lifting, carry, locomotion, cleaning, community activity, driving, and or occupation ?   ?PERSONAL FACTORS:  Rt TKA, HTN, arthritis and CAD are also affecting patient's functional outcome. ?  ?REHAB POTENTIAL: Good ?  ?CLINICAL DECISION MAKING: Stable/uncomplicated ?  ?EVALUATION COMPLEXITY: Low ?  ?  ?  ?GOALS: ?Short term PT Goals Target date: 05/20/2021 ?Pt will be I and compliant with HEP. ?Baseline:  ?Goal status: On-going 04/25/2021 ?  Pt will decrease pain by 25% overall ?Baseline: ?Goal status: On-going 04/25/2021 ?  ?Long term PT goals Target date: 07/01/2021 ?Pt will improve Rt knee PROM 2-115 deg to improve functional mobility ?Baseline: ?Goal status: New ?Pt will improve  Rt hip/knee strength to at least 5-/5 MMT to improve functional strength ?Baseline: ?Goal status: New ?Pt will improve FOTO to at least 55% functional to show improved function ?Baseline: ?Goal status: New ?Pt will reduce pain by overall 50% overall with usual activity ?Baseline: ?Goal status: New ?Pt will be able to ambulate community distances at least 1000 ft WNL gait pattern without complaints ?Baseline: ?Goal status: New ?  ?PLAN: ?PT FREQUENCY: 1-2 times per week  ?  ?PT DURATION: 10 weeks ?  ?PLANNED INTERVENTIONS (unless contraindicated): aquatic PT, Canalith repositioning, cryotherapy, Electrical stimulation, Iontophoresis with 4 mg/ml dexamethasome, Moist heat, traction, Ultrasound, gait training, Therapeutic exercise, balance training, neuromuscular re-education, patient/family education, prosthetic training, manual techniques, passive ROM, dry needling, taping, vasopnuematic device, vestibular, spinal manipulations, joint manipulations ?  ?PLAN FOR NEXT SESSION: knee ROM FOCUS and quad activation, bike, vaso, gait training to wean from cane ? ? ? ? ?Oretha Caprice, PT, MPT ?04/26/2021, 4:44 PM ? ?  ? ?

## 2021-05-04 ENCOUNTER — Encounter: Payer: Self-pay | Admitting: Physical Therapy

## 2021-05-04 ENCOUNTER — Ambulatory Visit (INDEPENDENT_AMBULATORY_CARE_PROVIDER_SITE_OTHER): Payer: 59 | Admitting: Physical Therapy

## 2021-05-04 ENCOUNTER — Other Ambulatory Visit: Payer: Self-pay | Admitting: Orthopaedic Surgery

## 2021-05-04 ENCOUNTER — Telehealth: Payer: Self-pay | Admitting: Orthopaedic Surgery

## 2021-05-04 DIAGNOSIS — M25561 Pain in right knee: Secondary | ICD-10-CM | POA: Diagnosis not present

## 2021-05-04 DIAGNOSIS — M25661 Stiffness of right knee, not elsewhere classified: Secondary | ICD-10-CM

## 2021-05-04 DIAGNOSIS — M6281 Muscle weakness (generalized): Secondary | ICD-10-CM | POA: Diagnosis not present

## 2021-05-04 DIAGNOSIS — R600 Localized edema: Secondary | ICD-10-CM

## 2021-05-04 DIAGNOSIS — R2689 Other abnormalities of gait and mobility: Secondary | ICD-10-CM

## 2021-05-04 MED ORDER — OXYCODONE HCL 5 MG PO TABS: 5.0000 mg | ORAL_TABLET | ORAL | 0 refills | Status: DC | PRN

## 2021-05-04 NOTE — Telephone Encounter (Signed)
Pt asking for refill on her medication. The best pharmacy is [id number] - [place], [place] - [address] AT [facility] OF [address] and the best call back number if needed is [phone].  ?

## 2021-05-04 NOTE — Telephone Encounter (Signed)
Please advise 

## 2021-05-04 NOTE — Therapy (Signed)
?OUTPATIENT PHYSICAL THERAPY TREATMENT NOTE ? ? ?Patient Name: Tracy Mercado ?MRN: [id number] ?DOB:07/04/1960, 61 y.o., female ?Today's Date: 05/04/2021 ? ?PCP: Patient, No Pcp Per (Inactive) ?REFERRING PROVIDER: Kathryne Hitch* ? ?END OF SESSION:  ? PT End of Session - 05/04/21 1307   ? ? Visit Number 4   ? Number of Visits 16   ? Date for PT Re-Evaluation 07/01/21   ? PT Start Time 1258   ? PT Stop Time 1336   ? PT Time Calculation (min) 38 min   ? Activity Tolerance Patient tolerated treatment well   ? Behavior During Therapy [facility] for tasks assessed/performed   ? ?  ?  ? ?  ? ? ? ? ?Past Medical History:  ?Diagnosis Date  ? Arthritis   ? CAD (coronary artery disease)   ? Hypertension   ? ?Past Surgical History:  ?Procedure Laterality Date  ? HAND SURGERY Left   ? KNEE ARTHROSCOPY    ? TOTAL KNEE ARTHROPLASTY Right 04/05/2021  ? Procedure: Right TOTAL KNEE ARTHROPLASTY;  Surgeon: Kathryne Hitch, MD;  Location: [facility] OR;  Service: Orthopedics;  Laterality: Right;  ? ?Patient Active Problem List  ? Diagnosis Date Noted  ? Status post right knee replacement 04/05/2021  ? Unilateral primary osteoarthritis, right knee 01/10/2021  ? ? ?REFERRING DIAG: Z96.651 (ICD-10-CM) - Status post right knee replacement ? ?THERAPY DIAG:  ?Acute pain of right knee ? ?Stiffness of right knee, not elsewhere classified ? ?Muscle weakness (generalized) ? ?Other abnormalities of gait and mobility ? ?Localized edema ? ?PERTINENT HISTORY: see above PMH ? ?PRECAUTIONS: none ? ?SUBJECTIVE: knee is tight today; pain is around 4/10  ? ?PAIN:  ?Are you having pain? Yes: NPRS scale: 4/10 ?Pain location: Rt knee ?Pain description: achy, stiffness ?Aggravating factors: bending, sleeping  ?Relieving factors: take pain meds ? ? ?OBJECTIVE: (objective measures completed at initial evaluation unless otherwise dated) ? ? ?PATIENT SURVEYS:  ?04/22/21: FOTO 29% functional ?   ?  ?PALPATION: ?TTP anterior knee ?  ?LE ROM: ?  ? AROM/PROM  Right ?04/22/2021 Rt ?04/25/21 Rt ?04/26/21  ?Hip flexion       ?Hip extension       ?Hip abduction       ?Hip adduction       ?Hip internal rotation       ?Hip external rotation       ?Knee flexion 96/103 100/110  104/109  ?Knee extension 10/7 12/8  10/8  ?Ankle dorsiflexion       ?Ankle plantarflexion       ?Ankle inversion       ?Ankle eversion       ? (Blank rows = not tested) ?  ?LE MMT: ?  ?MMT Right ?04/22/2021  ?Hip flexion 4  ?Hip extension    ?Hip abduction 4  ?Hip adduction 4-  ?Hip internal rotation    ?Hip external rotation    ?Knee flexion 4  ?Knee extension 4  ?Ankle dorsiflexion    ?Ankle plantarflexion    ?Ankle inversion    ?Ankle eversion    ? (Blank rows = not tested) ?  ?GAIT: ?Distance walked: 100 ft ?Assistive device utilized: Single point cane ?Level of assistance: Modified independence ?Comments: antalgic gait on Rt ?  ?  ?  ?TODAY'S TREATMENT: ?05/04/21 ?Therex: ?     Aerobic: ?Bike L3 x 8 min; seat 8 ?    Machines: ?Knee extension 5# 2x10; RLE only with min A from LLE ?Hamstring  curl 15# 2x10; RLE only with min A from LLE PRN ?Leg press 75# 3x10 bil; then RLE only 37# 3x10 ?    Seated: ?Sit to/from stand 2x10 reps without UE support ?     Standing: ?RLE on 4" step with Lt heel taps 2x10 ?Calf stretch 3x30 sec on slant board ?Discussed timing standing tolerance at home to work on increasing activity to simulate return to work activities ? ?Neuro Re-Ed: ?On balance beam: side stepping x 5 laps; tandem walk fwd x 5 laps in // bars; intermittent UE support needed ? ? ? ?04/26/21: ?TherEX ?Recumbent bike: seat at 8, L3 x 8 minutes ?LAQ: 3# x15 ?SLR seated: 2 x 10 (quad set before lift) ?Leg Press: 75# 2 x 10 bilateral LE's ?Leg Press: 37# 2 x 10 Rt LE only,  ?Calf stretch: 2 x 30 seconds on slant board ?Step ups 6 inch x 15 with UE support ?Standing on Airex mat: feet together 3 x 20 seconds with intermittent support ?Fitter Web designer: x 2 minutes ?Amb: 200 feet on level surface with no  device ?Manual:  ?PROM: flexion and overpressure with extension ?Modalities:  ?Vasopneumatic: medium compression, 34 degrees x 10 minutes ? ? ?04/25/21: ?TherEX ?Nustep: L4 x 8 minutes ?LAQ: 3# 2 x 10 ?SLR: 2 x 10 (quad set before lift) ?Leg Press ?Calf stretch: 2 x 30 seconds on slant board ?Step ups 6 inch x 15 with UE support ?Manual:  ?PROM: flexion and overpressure with extension ?Modalities:  ?Vasopneumatic: medium compression, 34 degrees x 10 minutes ? ? ?PATIENT EDUCATION:  ?04/26/2021 ?Education details: Pt instructed to begin walking on level surfaces with no device, gt training performed ?Person educated: Patient ?Education method: Explanation, Demonstration, Verbal cues, and Handouts ?Education comprehension: verbalized understanding, returned demonstration, and needs further education ?  ?  ?HOME EXERCISE PROGRAM: ?Access Code: [email or web address] ?URL: [email or web address] ?Date: 04/22/2021 ?Prepared by: Ivery Quale ?  ?Exercises ?- Seated Hamstring Stretch  - 2 x daily - 6 x weekly - 1 sets - 2 reps - 30 hold ?- Seated Straight Leg Raise with Quad Contraction  - 2 x daily - 6 x weekly - 1-2 sets - 10 reps ?- Seated Long Arc Quad  - 2 x daily - 6 x weekly - 2 sets - 10 reps ?- Seated Knee Flexion Stretch  - 2 x daily - 6 x weekly - 1-2 sets - 10 reps - 5 sec hold ?  ?  ?  ?ASSESSMENT: ?  ?CLINICAL IMPRESSION: Pt tolerated session well today with main focus on strengthening and balance.  Will need to be able to stand for long periods to return to work, so encouraged timing activity at home to get baseline tolerance, then slowly progress as able.  Will continue to benefit from PT to maximize function.  ?  ?OBJECTIVE IMPAIRMENTS: decreased activity tolerance, difficulty walking, decreased balance, decreased endurance, decreased mobility, decreased ROM, decreased strength, impaired flexibility, impaired LE use, postural dysfunction, and pain. ?  ?ACTIVITY LIMITATIONS: bending, lifting, carry,  locomotion, cleaning, community activity, driving, and or occupation ?  ?PERSONAL FACTORS:  Rt TKA, HTN, arthritis and CAD are also affecting patient's functional outcome. ?  ?REHAB POTENTIAL: Good ?  ?CLINICAL DECISION MAKING: Stable/uncomplicated ?  ?EVALUATION COMPLEXITY: Low ?  ?  ?  ?GOALS: ?Short term PT Goals Target date: 05/20/2021 ?Pt will be I and compliant with HEP. ?Baseline:  ?Goal status: On-going 04/25/2021 ?Pt will decrease pain by 25% overall ?Baseline: ?Goal status: On-going  04/25/2021 ?  ?Long term PT goals Target date: 07/01/2021 ?Pt will improve Rt knee PROM 2-115 deg to improve functional mobility ?Baseline: ?Goal status: New ?Pt will improve  Rt hip/knee strength to at least 5-/5 MMT to improve functional strength ?Baseline: ?Goal status: New ?Pt will improve FOTO to at least 55% functional to show improved function ?Baseline: ?Goal status: New ?Pt will reduce pain by overall 50% overall with usual activity ?Baseline: ?Goal status: New ?Pt will be able to ambulate community distances at least 1000 ft WNL gait pattern without complaints ?Baseline: ?Goal status: New ?  ?PLAN: ?PT FREQUENCY: 1-2 times per week  ?  ?PT DURATION: 10 weeks ?  ?PLANNED INTERVENTIONS (unless contraindicated): aquatic PT, Canalith repositioning, cryotherapy, Electrical stimulation, Iontophoresis with 4 mg/ml dexamethasome, Moist heat, traction, Ultrasound, gait training, Therapeutic exercise, balance training, neuromuscular re-education, patient/family education, prosthetic training, manual techniques, passive ROM, dry needling, taping, vasopnuematic device, vestibular, spinal manipulations, joint manipulations ?  ?PLAN FOR NEXT SESSION: continue quad strengthening, bike, vaso, gait training to wean from cane, work simulated tasks ? ? ? ? ?Clarita CraneStephanie F Maday Guarino, PT, DPT ?05/04/21 1:39 PM ? ? ?  ? ?

## 2021-05-06 ENCOUNTER — Ambulatory Visit (INDEPENDENT_AMBULATORY_CARE_PROVIDER_SITE_OTHER): Payer: 59 | Admitting: Rehabilitative and Restorative Service Providers"

## 2021-05-06 ENCOUNTER — Telehealth: Payer: Self-pay | Admitting: Orthopaedic Surgery

## 2021-05-06 ENCOUNTER — Encounter: Payer: Self-pay | Admitting: Rehabilitative and Restorative Service Providers"

## 2021-05-06 ENCOUNTER — Other Ambulatory Visit: Payer: Self-pay | Admitting: Orthopaedic Surgery

## 2021-05-06 DIAGNOSIS — M25561 Pain in right knee: Secondary | ICD-10-CM | POA: Diagnosis not present

## 2021-05-06 DIAGNOSIS — M6281 Muscle weakness (generalized): Secondary | ICD-10-CM | POA: Diagnosis not present

## 2021-05-06 DIAGNOSIS — M25661 Stiffness of right knee, not elsewhere classified: Secondary | ICD-10-CM

## 2021-05-06 DIAGNOSIS — R2689 Other abnormalities of gait and mobility: Secondary | ICD-10-CM

## 2021-05-06 DIAGNOSIS — R600 Localized edema: Secondary | ICD-10-CM

## 2021-05-06 NOTE — Telephone Encounter (Signed)
Patient called. She would like a refill on her muscle relaxer. Her call back number is [phone] ?

## 2021-05-06 NOTE — Therapy (Signed)
?OUTPATIENT PHYSICAL THERAPY TREATMENT NOTE ? ? ?Patient Name: Tracy Mercado ?MRN: [id number] ?DOB:02/24/1960, 61 y.o., female ?Today's Date: 05/06/2021 ? ?PCP: Patient, No Pcp Per (Inactive) ?REFERRING PROVIDER: Kathryne HitchBlackman, Christopher Y* ? ?END OF SESSION:  ? PT End of Session - 05/06/21 1259   ? ? Visit Number 5   ? Number of Visits 16   ? Date for PT Re-Evaluation 07/01/21   ? PT Start Time 1257   ? PT Stop Time 1336   ? PT Time Calculation (min) 39 min   ? Activity Tolerance Patient tolerated treatment well   ? [facility] assessed/performed   ? ?  ?  ? ?  ? ? ? ? ? ?Past Medical History:  ?Diagnosis Date  ? Arthritis   ? CAD (coronary artery disease)   ? Hypertension   ? ?Past Surgical History:  ?Procedure Laterality Date  ? HAND SURGERY Left   ? KNEE ARTHROSCOPY    ? TOTAL KNEE ARTHROPLASTY Right 04/05/2021  ? Procedure: Right TOTAL KNEE ARTHROPLASTY;  Surgeon: Kathryne HitchBlackman, Christopher Y, MD;  [facility];  Service: Orthopedics;  Laterality: Right;  ? ?Patient Active Problem List  ? Diagnosis Date Noted  ? Status post right knee replacement 04/05/2021  ? Unilateral primary osteoarthritis, right knee 01/10/2021  ? ? ?REFERRING DIAG: Z96.651 (ICD-10-CM) - Status post right knee replacement ? ?THERAPY DIAG:  ?Acute pain of right knee ? ?Stiffness of right knee, not elsewhere classified ? ?Muscle weakness (generalized) ? ?Other abnormalities of gait and mobility ? ?Localized edema ? ?PERTINENT HISTORY: see above PMH ? ?PRECAUTIONS: none ? ?SUBJECTIVE: Tightness still noted, rated pain at 4/10 today.  ? ?PAIN:  ?Are you having pain? Yes: NPRS scale: 4/10 ?Pain location: Rt knee ?Pain description: achy, stiffness ?Aggravating factors: complaints c bending ?Relieving factors: take pain meds, HEP at times ? ? ?OBJECTIVE: (objective measures completed at initial evaluation unless otherwise dated) ? ? ?PATIENT SURVEYS:  ?04/22/21: FOTO 29% functional ?   ?PALPATION: ?TTP anterior knee ?  ?LE  ROM: ?  ? AROM/PROM Right ?04/22/2021 Rt ?04/25/21 Rt ?04/26/21  ?Hip flexion       ?Hip extension       ?Hip abduction       ?Hip adduction       ?Hip internal rotation       ?Hip external rotation       ?Knee flexion 96/103 100/110  104/109  ?Knee extension 10/7 12/8  10/8  ?Ankle dorsiflexion       ?Ankle plantarflexion       ?Ankle inversion       ?Ankle eversion       ? (Blank rows = not tested) ?  ?LE MMT: ?  ?MMT Right ?04/22/2021 Right ?05/06/2021 Left ?05/06/2021  ?Hip flexion 4    ?Hip extension      ?Hip abduction 4    ?Hip adduction 4-    ?Hip internal rotation      ?Hip external rotation      ?Knee flexion 4    ?Knee extension 4 5/5 ?36.6, 37.3 lbs 5/5 ?60, 64 lbs  ?Ankle dorsiflexion      ?Ankle plantarflexion      ?Ankle inversion      ?Ankle eversion      ? (Blank rows = not tested) ?  ?GAIT: ?05/06/2021:  In clinic ambulation independently, cane use reported at times at home and in community ?  ?  ?  ?TODAY'S TREATMENT: ?  05/06/2021 ?Therex: ?     Aerobic: ?Bike L3 x 8 min; seat 8 ?    Machines: ?Knee extension 5#  3 x 10 eccentric lowering Rt leg, double leg up (slow lowering cues ) ?Hamstring curl 2 x 15 Rt leg only (slow focus control cues) ?Leg press Rt leg only 2 x 15 37 lbs ? Hamstring stretch after each set 30 sec Rt leg ?    Supine/seated: ?Heel prop TKE stretch 2 mins each position c cues for home use ?     Standing: ?Runner stretch Rt Calf stretch 3x30 sec on slant board c cues for pushing heel into ground  ?Fwd step up Rt 6 inch x 15 ? ? ?05/04/21 ?Therex: ?     Aerobic: ?Bike L3 x 8 min; seat 8 ?    Machines: ?Knee extension 5# 2x10; RLE only with min A from LLE ?Hamstring curl 15# 2x10; RLE only with min A from LLE PRN ?Leg press 75# 3x10 bil; then RLE only 37# 3x10 ?    Seated: ?Sit to/from stand 2x10 reps without UE support ?     Standing: ?RLE on 4" step with Lt heel taps 2x10 ?Calf stretch 3x30 sec on slant board ?Discussed timing standing tolerance at home to work on increasing activity to  simulate return to work activities ? ?Neuro Re-Ed: ?On balance beam: side stepping x 5 laps; tandem walk fwd x 5 laps in // bars; intermittent UE support needed ? ? ? ?04/26/21: ?TherEX ?Recumbent bike: seat at 8, L3 x 8 minutes ?LAQ: 3# x15 ?SLR seated: 2 x 10 (quad set before lift) ?Leg Press: 75# 2 x 10 bilateral LE's ?Leg Press: 37# 2 x 10 Rt LE only,  ?Calf stretch: 2 x 30 seconds on slant board ?Step ups 6 inch x 15 with UE support ?Standing on Airex mat: feet together 3 x 20 seconds with intermittent support ?Fitter Web designer: x 2 minutes ?Amb: 200 feet on level surface with no device ?Manual:  ?PROM: flexion and overpressure with extension ?Modalities:  ?Vasopneumatic: medium compression, 34 degrees x 10 minutes ? ? ?PATIENT EDUCATION:  ?04/26/2021 ?Education details: Pt instructed to begin walking on level surfaces with no device, gt training performed ?Person educated: Patient ?Education method: Explanation, Demonstration, Verbal cues, and Handouts ?Education comprehension: verbalized understanding, returned demonstration, and needs further education ?  ?  ?HOME EXERCISE PROGRAM: ?Access Code: [email or web address] ?URL: [email or web address] ?Date: 04/22/2021 ?Prepared by: Ivery Quale ?  ?Exercises ?- Seated Hamstring Stretch  - 2 x daily - 6 x weekly - 1 sets - 2 reps - 30 hold ?- Seated Straight Leg Raise with Quad Contraction  - 2 x daily - 6 x weekly - 1-2 sets - 10 reps ?- Seated Long Arc Quad  - 2 x daily - 6 x weekly - 2 sets - 10 reps ?- Seated Knee Flexion Stretch  - 2 x daily - 6 x weekly - 1-2 sets - 10 reps - 5 sec hold ?  ?  ?  ?ASSESSMENT: ?  ?CLINICAL IMPRESSION:  ?Detailed in discussion c Pt about inclusion of heel prop TKE at home for extension mobility gains.  Continued progression in extension mobility to improve ambulation quality.  Skilled PT services continued to be indicated at this time.  ?  ?OBJECTIVE IMPAIRMENTS: decreased activity tolerance, difficulty walking, decreased  balance, decreased endurance, decreased mobility, decreased ROM, decreased strength, impaired flexibility, impaired LE use, postural dysfunction, and pain. ?  ?ACTIVITY LIMITATIONS: bending, lifting,  carry, locomotion, cleaning, community activity, driving, and or occupation ?  ?PERSONAL FACTORS:  Rt TKA, HTN, arthritis and CAD are also affecting patient's functional outcome. ?  ?REHAB POTENTIAL: Good ?  ?CLINICAL DECISION MAKING: Stable/uncomplicated ?  ?EVALUATION COMPLEXITY: Low ?  ?  ?  ?GOALS: ?Short term PT Goals Target date: 05/20/2021 ?Pt will be I and compliant with HEP. ?Baseline:  ?Goal status: On-going 04/25/2021 ?Pt will decrease pain by 25% overall ?Baseline: ?Goal status: On-going 04/25/2021 ?  ?Long term PT goals Target date: 07/01/2021 ?Pt will improve Rt knee PROM 2-115 deg to improve functional mobility ?Baseline: ?Goal status: New ?Pt will improve  Rt hip/knee strength to at least 5-/5 MMT to improve functional strength ?Baseline: ?Goal status: New ?Pt will improve FOTO to at least 55% functional to show improved function ?Baseline: ?Goal status: New ?Pt will reduce pain by overall 50% overall with usual activity ?Baseline: ?Goal status: New ?Pt will be able to ambulate community distances at least 1000 ft WNL gait pattern without complaints ?Baseline: ?Goal status: New ?  ?PLAN: ?PT FREQUENCY: 1-2 times per week  ?  ?PT DURATION: 10 weeks ?  ?PLANNED INTERVENTIONS (unless contraindicated): aquatic PT, Canalith repositioning, cryotherapy, Electrical stimulation, Iontophoresis with 4 mg/ml dexamethasome, Moist heat, traction, Ultrasound, gait training, Therapeutic exercise, balance training, neuromuscular re-education, patient/family education, prosthetic training, manual techniques, passive ROM, dry needling, taping, vasopnuematic device, vestibular, spinal manipulations, joint manipulations ?  ?PLAN FOR NEXT SESSION: Continued TKE mobility and strength improvements.  ? ?Chyrel Masson, PT, DPT, OCS,  ATC ?05/06/21  1:34 PM ? ? ? ? ?  ? ?

## 2021-05-09 ENCOUNTER — Other Ambulatory Visit: Payer: Self-pay | Admitting: Orthopaedic Surgery

## 2021-05-09 MED ORDER — TIZANIDINE HCL 4 MG PO TABS: ORAL_TABLET | ORAL | 1 refills | Status: DC

## 2021-05-16 ENCOUNTER — Ambulatory Visit (INDEPENDENT_AMBULATORY_CARE_PROVIDER_SITE_OTHER): Payer: 59 | Admitting: Orthopaedic Surgery

## 2021-05-16 ENCOUNTER — Encounter: Payer: Self-pay | Admitting: Orthopaedic Surgery

## 2021-05-16 DIAGNOSIS — Z96651 Presence of right artificial knee joint: Secondary | ICD-10-CM

## 2021-05-16 MED ORDER — OXYCODONE HCL 5 MG PO TABS: 5.0000 mg | ORAL_TABLET | Freq: Four times a day (QID) | ORAL | 0 refills | Status: DC | PRN

## 2021-05-16 MED ORDER — TIZANIDINE HCL 4 MG PO TABS: ORAL_TABLET | ORAL | 1 refills | Status: DC

## 2021-05-16 NOTE — Progress Notes (Signed)
The patient is 6 weeks tomorrow status post a right total knee arthroplasty.  She reports that she is doing much better with getting her knee bending and moving. ? ?There is still swelling from her right knee replacement to be expected at 6 weeks.  The knee feels ligamentously stable.  She lacks full extension by about 3 degrees and I can flex her to past under 15 degrees.  The knee feels stable. ? ?She will continue to increase her activities as comfort allows.  I will talk to her about weaning pain medications and will refill her oxycodone only 1 more time and then wean from there.  I will send in some Zanaflex as well.  I would like to see her back in 4 weeks so that visit we can talk about return to work.  No x-rays are needed at that visit. ?

## 2021-05-20 ENCOUNTER — Ambulatory Visit (INDEPENDENT_AMBULATORY_CARE_PROVIDER_SITE_OTHER): Payer: 59 | Admitting: Rehabilitative and Restorative Service Providers"

## 2021-05-20 ENCOUNTER — Encounter: Payer: Self-pay | Admitting: Rehabilitative and Restorative Service Providers"

## 2021-05-20 DIAGNOSIS — M6281 Muscle weakness (generalized): Secondary | ICD-10-CM | POA: Diagnosis not present

## 2021-05-20 DIAGNOSIS — R2689 Other abnormalities of gait and mobility: Secondary | ICD-10-CM | POA: Diagnosis not present

## 2021-05-20 DIAGNOSIS — M25661 Stiffness of right knee, not elsewhere classified: Secondary | ICD-10-CM | POA: Diagnosis not present

## 2021-05-20 DIAGNOSIS — R600 Localized edema: Secondary | ICD-10-CM

## 2021-05-20 DIAGNOSIS — M25561 Pain in right knee: Secondary | ICD-10-CM

## 2021-05-20 NOTE — Therapy (Signed)
OUTPATIENT PHYSICAL THERAPY TREATMENT NOTE   Patient Name: Tracy Mercado MRN: [id number] DOB:April 19, 1960, 61 y.o., female Today's Date: 05/20/2021  PCP: Patient, No Pcp Per (Inactive) REFERRING PROVIDER: Mcarthur Rossetti*  END OF SESSION:   PT End of Session - 05/20/21 1303     Visit Number 6    Number of Visits 16    Date for PT Re-Evaluation 07/01/21    PT Start Time 1255    PT Stop Time 9628    PT Time Calculation (min) 43 min    Activity Tolerance Patient tolerated treatment well    Behavior During Therapy WFL for tasks assessed/performed                 Past Medical History:  Diagnosis Date   Arthritis    CAD (coronary artery disease)    Hypertension    Past Surgical History:  Procedure Laterality Date   HAND SURGERY Left    KNEE ARTHROSCOPY     TOTAL KNEE ARTHROPLASTY Right 04/05/2021   Procedure: Right TOTAL KNEE ARTHROPLASTY;  Surgeon: Mcarthur Rossetti, MD;  Location: [place];  Service: Orthopedics;  Laterality: Right;   Patient Active Problem List   Diagnosis Date Noted   Status post right knee replacement 04/05/2021   Unilateral primary osteoarthritis, right knee 01/10/2021    REFERRING DIAG: Z66.294 (ICD-10-CM) - Status post right knee replacement  THERAPY DIAG:  Acute pain of right knee  Stiffness of right knee, not elsewhere classified  Muscle weakness (generalized)  Other abnormalities of gait and mobility  Localized edema  PERTINENT HISTORY: see above PMH  PRECAUTIONS: none  SUBJECTIVE: Tightness still noted, rated pain at 4/10 today.   PAIN:  Are you having pain? Yes: NPRS scale: 4/10 Pain location: Rt knee Pain description: achy, stiffness Aggravating factors: complaints c bending Relieving factors: take pain meds, HEP at times   OBJECTIVE: (objective measures completed at initial evaluation unless otherwise dated)   PATIENT SURVEYS:  05/20/2021: FOTO update 77%  04/22/21: FOTO 29% functional     PALPATION: 04/22/2021: TTP anterior knee   LE ROM:    AROM/PROM Right 04/22/2021 Rt 04/25/21 Rt 04/26/21 Right 05/20/2021  Hip flexion        Hip extension        Hip abduction        Hip adduction        Hip internal rotation        Hip external rotation        Knee flexion 96/103 100/110  104/109   Knee extension 10/7 12/8  10/8 AROM -8 LAQ -2 in quad set in supine  Ankle dorsiflexion        Ankle plantarflexion        Ankle inversion        Ankle eversion         (Blank rows = not tested)   LE MMT:   MMT Right 04/22/2021 Right 05/06/2021 Left 05/06/2021 Right 05/20/2021  Hip flexion 4   5/5  Hip extension       Hip abduction 4     Hip adduction 4-     Hip internal rotation       Hip external rotation       Knee flexion 4     Knee extension 4 5/5 36.6, 37.3 lbs 5/5 60, 64 lbs 5/5  45.3, 42.9 lbs  Ankle dorsiflexion       Ankle plantarflexion       Ankle inversion  Ankle eversion        (Blank rows = not tested)   GAIT: 05/06/2021:  In clinic ambulation independently, cane use reported at times at home and in community       TODAY'S TREATMENT: 05/20/2021 Therex:      Aerobic: Bike L3 x 8 min; seat 8     Machines: Knee extension 5#  3 x 10 eccentric lowering Rt leg, double leg up (slow lowering cues ) Hamstring curl 3 x 10 Rt leg only 10 lbs (slow focus control cues)     Standing: Runner stretch Rt Calf stretch 3x30 sec on slant board c cues for pushing heel into ground  Blue band TKE hold 5 sec x 15  TherActivity (to improve functional strength for stairs, transfers, ambulation)  6 inch step up and over/down WB on Rt leg c single hand rail x 16 Leg press Rt leg only 2 x 15 37 lbs  Hamstring stretch after each set 30 sec Rt leg  05/06/2021 Therex:      Aerobic: Bike L3 x 8 min; seat 8     Machines: Knee extension 5#  3 x 10 eccentric lowering Rt leg, double leg up (slow lowering cues ) Hamstring curl 2 x 15 Rt leg only (slow focus control  cues) Leg press Rt leg only 2 x 15 37 lbs  Hamstring stretch after each set 30 sec Rt leg     Supine/seated: Heel prop TKE stretch 2 mins each position c cues for home use      Standing: Runner stretch Rt Calf stretch 3x30 sec on slant board c cues for pushing heel into ground  Fwd step up Rt 6 inch x 15   05/04/21 Therex:      Aerobic: Bike L3 x 8 min; seat 8     Machines: Knee extension 5# 2x10; RLE only with min A from LLE Hamstring curl 15# 2x10; RLE only with min A from LLE PRN Leg press 75# 3x10 bil; then RLE only 37# 3x10     Seated: Sit to/from stand 2x10 reps without UE support      Standing: RLE on 4" step with Lt heel taps 2x10 Calf stretch 3x30 sec on slant board Discussed timing standing tolerance at home to work on increasing activity to simulate return to work activities  Neuro Re-Ed: On balance beam: side stepping x 5 laps; tandem walk fwd x 5 laps in // bars; intermittent UE support needed   PATIENT EDUCATION:  04/26/2021 Education details: Pt instructed to begin walking on level surfaces with no device, gt training performed Person educated: Patient Education method: Explanation, Demonstration, Verbal cues, and Handouts Education comprehension: verbalized understanding, returned demonstration, and needs further education     HOME EXERCISE PROGRAM: Access Code: [facility] URL: [email or web address] Date: 04/22/2021 Prepared by: Elsie Ra   Exercises - Seated Hamstring Stretch  - 2 x daily - 6 x weekly - 1 sets - 2 reps - 30 hold - Seated Straight Leg Raise with Quad Contraction  - 2 x daily - 6 x weekly - 1-2 sets - 10 reps - Seated Long Arc Quad  - 2 x daily - 6 x weekly - 2 sets - 10 reps - Seated Knee Flexion Stretch  - 2 x daily - 6 x weekly - 1-2 sets - 10 reps - 5 sec hold       ASSESSMENT:   CLINICAL IMPRESSION:  Pt demonstrated fair to good control in step activity today, making  progress towards reciprocal gait pattern for  stair navigation.  Continued strengthening and end range mobility gains warranted at this time.    OBJECTIVE IMPAIRMENTS: decreased activity tolerance, difficulty walking, decreased balance, decreased endurance, decreased mobility, decreased ROM, decreased strength, impaired flexibility, impaired LE use, postural dysfunction, and pain.   ACTIVITY LIMITATIONS: bending, lifting, carry, locomotion, cleaning, community activity, driving, and or occupation   PERSONAL FACTORS:  Rt TKA, HTN, arthritis and CAD are also affecting patient's functional outcome.   REHAB POTENTIAL: Good   CLINICAL DECISION MAKING: Stable/uncomplicated   EVALUATION COMPLEXITY: Low       GOALS: Short term PT Goals Target date: 05/20/2021 Pt will be I and compliant with HEP. Baseline:  Goal status: MET 05/20/2021 Pt will decrease pain by 25% overall Baseline: Goal status: MET 05/20/2021   Long term PT goals Target date: 07/01/2021 Pt will improve Rt knee PROM 2-115 deg to improve functional mobility Baseline: Goal status: on going 05/20/2021 Pt will improve  Rt hip/knee strength to at least 5-/5 MMT to improve functional strength Baseline: Goal status: on going 05/20/2021 Pt will improve FOTO to at least 55% functional to show improved function Baseline: Goal status: MET 05/20/2021 Pt will reduce pain by overall 50% overall with usual activity Baseline: Goal status: MET 05/20/2021 Pt will be able to ambulate community distances at least 1000 ft WNL gait pattern without complaints Baseline: Goal status: MET 05/20/2021   PLAN: PT FREQUENCY: 1-2 times per week    PT DURATION: 10 weeks   PLANNED INTERVENTIONS (unless contraindicated): aquatic PT, Canalith repositioning, cryotherapy, Electrical stimulation, Iontophoresis with 4 mg/ml dexamethasome, Moist heat, traction, Ultrasound, gait training, Therapeutic exercise, balance training, neuromuscular re-education, patient/family education, prosthetic training, manual  techniques, passive ROM, dry needling, taping, vasopnuematic device, vestibular, spinal manipulations, joint manipulations   PLAN FOR NEXT SESSION: Continue to improve quad strength and hamstring mobility.   Scot Jun, PT, DPT, OCS, ATC 05/20/21  1:44 PM

## 2021-05-23 ENCOUNTER — Other Ambulatory Visit: Payer: Self-pay | Admitting: Orthopaedic Surgery

## 2021-05-23 ENCOUNTER — Encounter: Payer: Self-pay | Admitting: Physical Therapy

## 2021-05-23 ENCOUNTER — Telehealth: Payer: Self-pay | Admitting: Orthopaedic Surgery

## 2021-05-23 ENCOUNTER — Ambulatory Visit (INDEPENDENT_AMBULATORY_CARE_PROVIDER_SITE_OTHER): Payer: 59 | Admitting: Physical Therapy

## 2021-05-23 DIAGNOSIS — R2689 Other abnormalities of gait and mobility: Secondary | ICD-10-CM

## 2021-05-23 DIAGNOSIS — M25661 Stiffness of right knee, not elsewhere classified: Secondary | ICD-10-CM

## 2021-05-23 DIAGNOSIS — M6281 Muscle weakness (generalized): Secondary | ICD-10-CM | POA: Diagnosis not present

## 2021-05-23 DIAGNOSIS — R600 Localized edema: Secondary | ICD-10-CM

## 2021-05-23 DIAGNOSIS — M25561 Pain in right knee: Secondary | ICD-10-CM | POA: Diagnosis not present

## 2021-05-23 MED ORDER — HYDROCODONE-ACETAMINOPHEN 7.5-325 MG PO TABS: 1.0000 | ORAL_TABLET | Freq: Four times a day (QID) | ORAL | 0 refills | Status: DC | PRN

## 2021-05-23 NOTE — Telephone Encounter (Signed)
Pt called stating dr. Ninfa Linden is to start her on a new pain medication. Pt is asking for a call back and send new medication to pharmacy on file. Pt phone number is [phone].

## 2021-05-23 NOTE — Telephone Encounter (Signed)
Please advise 

## 2021-05-23 NOTE — Therapy (Signed)
OUTPATIENT PHYSICAL THERAPY TREATMENT NOTE   Patient Name: Tracy Mercado MRN: [id number] DOB:1960/05/01, 61 y.o., female Today's Date: 05/23/2021  PCP: Patient, No Pcp Per (Inactive) REFERRING PROVIDER: Mcarthur Rossetti*  END OF SESSION:   PT End of Session - 05/23/21 1623     Visit Number 7    Number of Visits 16    Date for PT Re-Evaluation 07/01/21    PT Start Time 1600    PT Stop Time 1640    PT Time Calculation (min) 40 min    Activity Tolerance Patient tolerated treatment well    Behavior During Therapy WFL for tasks assessed/performed                  Past Medical History:  Diagnosis Date   Arthritis    CAD (coronary artery disease)    Hypertension    Past Surgical History:  Procedure Laterality Date   HAND SURGERY Left    KNEE ARTHROSCOPY     TOTAL KNEE ARTHROPLASTY Right 04/05/2021   Procedure: Right TOTAL KNEE ARTHROPLASTY;  Surgeon: Mcarthur Rossetti, MD;  Location: [place];  Service: Orthopedics;  Laterality: Right;   Patient Active Problem List   Diagnosis Date Noted   Status post right knee replacement 04/05/2021   Unilateral primary osteoarthritis, right knee 01/10/2021    REFERRING DIAG: T62.263 (ICD-10-CM) - Status post right knee replacement  THERAPY DIAG:  Acute pain of right knee  Stiffness of right knee, not elsewhere classified  Muscle weakness (generalized)  Other abnormalities of gait and mobility  Localized edema  PERTINENT HISTORY: see above PMH  PRECAUTIONS: none  SUBJECTIVE: pain is not bad today, but still with some stiffness  PAIN:  Are you having pain? Yes: NPRS scale: 1/10 Pain location: Rt knee Pain description: achy, stiffness Aggravating factors: complaints c bending Relieving factors: take pain meds, HEP at times   OBJECTIVE: (objective measures completed at initial evaluation unless otherwise dated)   PATIENT SURVEYS:  05/20/2021: FOTO update 77%  04/22/21: FOTO 29% functional     PALPATION: 04/22/2021: TTP anterior knee   LE ROM:    AROM/PROM Right 04/22/2021 Rt 04/25/21 Rt 04/26/21 Right 05/20/2021 Right 05/24/21  Hip flexion         Hip extension         Hip abduction         Hip adduction         Hip internal rotation         Hip external rotation         Knee flexion 96/103 100/110  104/109  110/117  Knee extension 10/7 12/8  10/8 AROM -8 LAQ -2 in quad set in supine -4/0  Ankle dorsiflexion         Ankle plantarflexion         Ankle inversion         Ankle eversion          (Blank rows = not tested)   LE MMT:   MMT Right 04/22/2021 Right 05/06/2021 Left 05/06/2021 Right 05/20/2021  Hip flexion 4   5/5  Hip extension       Hip abduction 4     Hip adduction 4-     Hip internal rotation       Hip external rotation       Knee flexion 4     Knee extension 4 5/5 36.6, 37.3 lbs 5/5 60, 64 lbs 5/5  45.3, 42.9 lbs  Ankle dorsiflexion  Ankle plantarflexion       Ankle inversion       Ankle eversion        (Blank rows = not tested)   GAIT: 05/06/2021:  In clinic ambulation independently, cane use reported at times at home and in community       TODAY'S TREATMENT: 05/23/2021 Therex:      Aerobic: Bike L3 x 8 min; seat 8     Machines: Knee extension 5#  3 x 10 eccentric lowering Rt leg, double leg up (slow lowering cues ) Hamstring curl 3 x 10 Rt leg only 10 lbs (slow focus control cues) Leg press Rt leg only 2 x 15 37 lbs, then DL 75# 2X15     Standing:   Sidestepping on blue foam 5 round trips   Tandem walk on foam 5 round trips Blue band TKE hold 5 sec x 15  6 inch step up and over/down WB on Rt leg c single hand rail x 16 fwd and X 10 each way lateral    Seated:  Sit to stands 2X10 no UE support  Seated H.S. stretch 30 sec X 3  Manual therapy  Rt knee PROM with extension emphasis and extension mobs   05/20/2021 Therex:      Aerobic: Bike L3 x 8 min; seat 8     Machines: Knee extension 5#  3 x 10 eccentric lowering Rt  leg, double leg up (slow lowering cues ) Hamstring curl 3 x 10 Rt leg only 10 lbs (slow focus control cues)     Standing: Runner stretch Rt Calf stretch 3x30 sec on slant board c cues for pushing heel into ground  Blue band TKE hold 5 sec x 15  TherActivity (to improve functional strength for stairs, transfers, ambulation)  6 inch step up and over/down WB on Rt leg c single hand rail x 16 Leg press Rt leg only 2 x 15 37 lbs  Hamstring stretch after each set 30 sec Rt leg  05/06/2021 Therex:      Aerobic: Bike L3 x 8 min; seat 8     Machines: Knee extension 5#  3 x 10 eccentric lowering Rt leg, double leg up (slow lowering cues ) Hamstring curl 2 x 15 Rt leg only (slow focus control cues) Leg press Rt leg only 2 x 15 37 lbs  Hamstring stretch after each set 30 sec Rt leg     Supine/seated: Heel prop TKE stretch 2 mins each position c cues for home use      Standing: Runner stretch Rt Calf stretch 3x30 sec on slant board c cues for pushing heel into ground  Fwd step up Rt 6 inch x 15   05/04/21 Therex:      Aerobic: Bike L3 x 8 min; seat 8     Machines: Knee extension 5# 2x10; RLE only with min A from LLE Hamstring curl 15# 2x10; RLE only with min A from LLE PRN Leg press 75# 3x10 bil; then RLE only 37# 3x10     Seated: Sit to/from stand 2x10 reps without UE support      Standing: RLE on 4" step with Lt heel taps 2x10 Calf stretch 3x30 sec on slant board Discussed timing standing tolerance at home to work on increasing activity to simulate return to work activities  Neuro Re-Ed: On balance beam: side stepping x 5 laps; tandem walk fwd x 5 laps in // bars; intermittent UE support needed   PATIENT EDUCATION:  04/26/2021 Education details: Pt instructed to begin walking on level surfaces with no device, gt training performed Person educated: Patient Education method: Explanation, Demonstration, Verbal cues, and Handouts Education comprehension: verbalized understanding,  returned demonstration, and needs further education     HOME EXERCISE PROGRAM: Access Code: [facility] URL: [email or web address] Date: 04/22/2021 Prepared by: Elsie Ra   Exercises - Seated Hamstring Stretch  - 2 x daily - 6 x weekly - 1 sets - 2 reps - 30 hold - Seated Straight Leg Raise with Quad Contraction  - 2 x daily - 6 x weekly - 1-2 sets - 10 reps - Seated Long Arc Quad  - 2 x daily - 6 x weekly - 2 sets - 10 reps - Seated Knee Flexion Stretch  - 2 x daily - 6 x weekly - 1-2 sets - 10 reps - 5 sec hold       ASSESSMENT:   CLINICAL IMPRESSION:  She is progressing very well with PT but does still lack mild ROM and strength deficits that we will continue to work to progress with PT.   OBJECTIVE IMPAIRMENTS: decreased activity tolerance, difficulty walking, decreased balance, decreased endurance, decreased mobility, decreased ROM, decreased strength, impaired flexibility, impaired LE use, postural dysfunction, and pain.   ACTIVITY LIMITATIONS: bending, lifting, carry, locomotion, cleaning, community activity, driving, and or occupation   PERSONAL FACTORS:  Rt TKA, HTN, arthritis and CAD are also affecting patient's functional outcome.   REHAB POTENTIAL: Good   CLINICAL DECISION MAKING: Stable/uncomplicated   EVALUATION COMPLEXITY: Low       GOALS: Short term PT Goals Target date: 05/20/2021 Pt will be I and compliant with HEP. Baseline:  Goal status: MET 05/20/2021 Pt will decrease pain by 25% overall Baseline: Goal status: MET 05/20/2021   Long term PT goals Target date: 07/01/2021 Pt will improve Rt knee PROM 2-115 deg to improve functional mobility Baseline: Goal status: on going 05/20/2021 Pt will improve  Rt hip/knee strength to at least 5-/5 MMT to improve functional strength Baseline: Goal status: on going 05/20/2021 Pt will improve FOTO to at least 55% functional to show improved function Baseline: Goal status: MET 05/20/2021 Pt will reduce  pain by overall 50% overall with usual activity Baseline: Goal status: MET 05/20/2021 Pt will be able to ambulate community distances at least 1000 ft WNL gait pattern without complaints Baseline: Goal status: MET 05/20/2021   PLAN: PT FREQUENCY: 1-2 times per week    PT DURATION: 10 weeks   PLANNED INTERVENTIONS (unless contraindicated): aquatic PT, Canalith repositioning, cryotherapy, Electrical stimulation, Iontophoresis with 4 mg/ml dexamethasome, Moist heat, traction, Ultrasound, gait training, Therapeutic exercise, balance training, neuromuscular re-education, patient/family education, prosthetic training, manual techniques, passive ROM, dry needling, taping, vasopnuematic device, vestibular, spinal manipulations, joint manipulations   PLAN FOR NEXT SESSION: Continue to improve quad strength and hamstring mobility.   Elsie Ra, PT, DPT 05/23/21 4:24 PM

## 2021-05-25 ENCOUNTER — Telehealth: Payer: Self-pay

## 2021-05-25 ENCOUNTER — Other Ambulatory Visit: Payer: Self-pay | Admitting: Orthopaedic Surgery

## 2021-05-25 MED ORDER — TRAMADOL HCL 50 MG PO TABS: 100.0000 mg | ORAL_TABLET | Freq: Four times a day (QID) | ORAL | 0 refills | Status: DC | PRN

## 2021-05-25 NOTE — Telephone Encounter (Signed)
Please advise 

## 2021-05-25 NOTE — Telephone Encounter (Signed)
I called and talked to the pt. She already tried benadryl. I advised her to stop and try the tramadol. She stated understanding

## 2021-05-25 NOTE — Telephone Encounter (Signed)
Patient called stating that she is having a reaction to the Hydrocodone that she was prescribed On Monday, 05/23/2021.  Patient stated that she started itching really bad Monday night and Tuesday.  Did advise patient not to take any more, patient voiced that she understands.  Would like another Rx sent to her pharmacy for pain.  Cb# [phone].  Please advise.  Thank you.

## 2021-05-27 ENCOUNTER — Encounter: Payer: Self-pay | Admitting: Rehabilitative and Restorative Service Providers"

## 2021-05-27 ENCOUNTER — Ambulatory Visit (INDEPENDENT_AMBULATORY_CARE_PROVIDER_SITE_OTHER): Payer: 59 | Admitting: Rehabilitative and Restorative Service Providers"

## 2021-05-27 DIAGNOSIS — M6281 Muscle weakness (generalized): Secondary | ICD-10-CM

## 2021-05-27 DIAGNOSIS — M25661 Stiffness of right knee, not elsewhere classified: Secondary | ICD-10-CM | POA: Diagnosis not present

## 2021-05-27 DIAGNOSIS — M25561 Pain in right knee: Secondary | ICD-10-CM | POA: Diagnosis not present

## 2021-05-27 DIAGNOSIS — R600 Localized edema: Secondary | ICD-10-CM

## 2021-05-27 DIAGNOSIS — R2689 Other abnormalities of gait and mobility: Secondary | ICD-10-CM

## 2021-05-27 NOTE — Therapy (Signed)
OUTPATIENT PHYSICAL THERAPY TREATMENT NOTE   Patient Name: Tracy Mercado MRN: [id number] DOB:07-Oct-1960, 61 y.o., female Today's Date: 05/27/2021  PCP: Patient, No Pcp Per (Inactive) REFERRING PROVIDER: Mcarthur Rossetti*  END OF SESSION:   PT End of Session - 05/27/21 1051     Visit Number 8    Number of Visits 16    Date for PT Re-Evaluation 07/01/21    PT Start Time 0086    PT Stop Time 7619    PT Time Calculation (min) 39 min    Activity Tolerance Patient tolerated treatment well    Behavior During Therapy WFL for tasks assessed/performed                   Past Medical History:  Diagnosis Date   Arthritis    CAD (coronary artery disease)    Hypertension    Past Surgical History:  Procedure Laterality Date   HAND SURGERY Left    KNEE ARTHROSCOPY     TOTAL KNEE ARTHROPLASTY Right 04/05/2021   Procedure: Right TOTAL KNEE ARTHROPLASTY;  Surgeon: Mcarthur Rossetti, MD;  Location: [place];  Service: Orthopedics;  Laterality: Right;   Patient Active Problem List   Diagnosis Date Noted   Status post right knee replacement 04/05/2021   Unilateral primary osteoarthritis, right knee 01/10/2021    REFERRING DIAG: J09.326 (ICD-10-CM) - Status post right knee replacement  THERAPY DIAG:  Acute pain of right knee  Stiffness of right knee, not elsewhere classified  Muscle weakness (generalized)  Other abnormalities of gait and mobility  Localized edema  PERTINENT HISTORY: see above PMH  PRECAUTIONS: none  SUBJECTIVE: Pt indicated the MD office switched off oxy to another medicine.  No pain at rest noted.   PAIN:  NPRS scale: 0/10 Pain location: Rt knee Pain description:  Aggravating factors:  Relieving factors:    OBJECTIVE: (objective measures completed at initial evaluation unless otherwise dated)   PATIENT SURVEYS:  05/20/2021: FOTO update 77%  04/22/21: FOTO 29% functional    PALPATION: 04/22/2021: TTP anterior knee   LE  ROM:    AROM/PROM Right 04/22/2021 Rt 04/25/21 Rt 04/26/21 Right 05/20/2021 Right 05/24/21  Hip flexion         Hip extension         Hip abduction         Hip adduction         Hip internal rotation         Hip external rotation         Knee flexion 96/103 100/110  104/109  110/117  Knee extension 10/7 12/8  10/8 AROM -8 LAQ -2 in quad set in supine -4/0  Ankle dorsiflexion         Ankle plantarflexion         Ankle inversion         Ankle eversion          (Blank rows = not tested)   LE MMT:   MMT Right 04/22/2021 Right 05/06/2021 Left 05/06/2021 Right 05/20/2021  Hip flexion 4   5/5  Hip extension       Hip abduction 4     Hip adduction 4-     Hip internal rotation       Hip external rotation       Knee flexion 4     Knee extension 4 5/5 36.6, 37.3 lbs 5/5 60, 64 lbs 5/5  45.3, 42.9 lbs  Ankle dorsiflexion  Ankle plantarflexion       Ankle inversion       Ankle eversion        (Blank rows = not tested)   GAIT: 05/06/2021:  In clinic ambulation independently, cane use reported at times at home and in community       TODAY'S TREATMENT: 05/27/2021 Therex:     Aerobic: Bike L3 x 8 min; seat 8     Machines: Knee extension 10#  3 x 10 eccentric lowering Rt leg, double leg up (slow lowering cues )    Standing: Runner stretch Rt Calf stretch 3x30 sec on slant board c cues for pushing heel into ground       Seated  Seated TKE stretch 3 mins in chair c foot prop 2 lbs on knee Rt   NeuroRe-ed  SLS c vector reaching light touch c contralateral fwd/lateral/reverse x 8 bilateral  Anterior/posterior weight shift on foam 2 mins for ankle strategy improvements  TherActivity (to improve functional strength for stairs, transfers, ambulation)  6 inch lateral step down c TKE blue band 2 x 10  Leg press Rt leg only 2 x 15 37 lbs, DL 81 lbs X15  Hamstring stretch after each set 30 sec Rt leg   05/23/2021 Therex:      Aerobic: Bike L3 x 8 min; seat 8      Machines: Knee extension 5#  3 x 10 eccentric lowering Rt leg, double leg up (slow lowering cues ) Hamstring curl 3 x 10 Rt leg only 10 lbs (slow focus control cues) Leg press Rt leg only 2 x 15 37 lbs, then DL 75# 2X15     Standing:   Sidestepping on blue foam 5 round trips   Tandem walk on foam 5 round trips Blue band TKE hold 5 sec x 15  6 inch step up and over/down WB on Rt leg c single hand rail x 16 fwd and X 10 each way lateral    Seated:  Sit to stands 2X10 no UE support  Seated H.S. stretch 30 sec X 3  Manual therapy  Rt knee PROM with extension emphasis and extension mobs   05/20/2021 Therex:      Aerobic: Bike L3 x 8 min; seat 8     Machines: Knee extension 5#  3 x 10 eccentric lowering Rt leg, double leg up (slow lowering cues ) Hamstring curl 3 x 10 Rt leg only 10 lbs (slow focus control cues)     Standing: Runner stretch Rt Calf stretch 3x30 sec on slant board c cues for pushing heel into ground  Blue band TKE hold 5 sec x 15  TherActivity (to improve functional strength for stairs, transfers, ambulation)  6 inch step up and over/down WB on Rt leg c single hand rail x 16 Leg press Rt leg only 2 x 15 37 lbs  Hamstring stretch after each set 30 sec Rt leg   PATIENT EDUCATION:  04/26/2021 Education details: Pt instructed to begin walking on level surfaces with no device, gt training performed Person educated: Patient Education method: Consulting civil engineer, Demonstration, Verbal cues, and Handouts Education comprehension: verbalized understanding, returned demonstration, and needs further education     HOME EXERCISE PROGRAM: Access Code: [facility] URL: [email or web address] Date: 04/22/2021 Prepared by: Elsie Ra   Exercises - Seated Hamstring Stretch  - 2 x daily - 6 x weekly - 1 sets - 2 reps - 30 hold - Seated Straight Leg Raise with Quad Contraction  - 2  x daily - 6 x weekly - 1-2 sets - 10 reps - Seated Long Arc Quad  - 2 x daily - 6 x weekly -  2 sets - 10 reps - Seated Knee Flexion Stretch  - 2 x daily - 6 x weekly - 1-2 sets - 10 reps - 5 sec hold       ASSESSMENT:   CLINICAL IMPRESSION:  Pt to benefit from continued progressive WB strengthening and extension gains to facilitate improved ambulation and functional movement patterns for daily activity. Overall progressing well in activity.    OBJECTIVE IMPAIRMENTS: decreased activity tolerance, difficulty walking, decreased balance, decreased endurance, decreased mobility, decreased ROM, decreased strength, impaired flexibility, impaired LE use, postural dysfunction, and pain.   ACTIVITY LIMITATIONS: bending, lifting, carry, locomotion, cleaning, community activity, driving, and or occupation   PERSONAL FACTORS:  Rt TKA, HTN, arthritis and CAD are also affecting patient's functional outcome.   REHAB POTENTIAL: Good   CLINICAL DECISION MAKING: Stable/uncomplicated   EVALUATION COMPLEXITY: Low       GOALS: Short term PT Goals Target date: 05/20/2021 Pt will be I and compliant with HEP. Baseline:  Goal status: MET 05/20/2021 Pt will decrease pain by 25% overall Baseline: Goal status: MET 05/20/2021   Long term PT goals Target date: 07/01/2021 Pt will improve Rt knee PROM 2-115 deg to improve functional mobility Baseline: Goal status: on going 05/20/2021 Pt will improve  Rt hip/knee strength to at least 5-/5 MMT to improve functional strength Baseline: Goal status: on going 05/20/2021 Pt will improve FOTO to at least 55% functional to show improved function Baseline: Goal status: MET 05/20/2021 Pt will reduce pain by overall 50% overall with usual activity Baseline: Goal status: MET 05/20/2021 Pt will be able to ambulate community distances at least 1000 ft WNL gait pattern without complaints Baseline: Goal status: MET 05/20/2021   PLAN: PT FREQUENCY: 1-2 times per week    PT DURATION: 10 weeks   PLANNED INTERVENTIONS (unless contraindicated): aquatic PT, Canalith  repositioning, cryotherapy, Electrical stimulation, Iontophoresis with 4 mg/ml dexamethasome, Moist heat, traction, Ultrasound, gait training, Therapeutic exercise, balance training, neuromuscular re-education, patient/family education, prosthetic training, manual techniques, passive ROM, dry needling, taping, vasopnuematic device, vestibular, spinal manipulations, joint manipulations   PLAN FOR NEXT SESSION: WB strengthening continued, dynamic balance control improvements.    Scot Jun, PT, DPT, OCS, ATC 05/27/21  11:29 AM

## 2021-05-31 ENCOUNTER — Ambulatory Visit (INDEPENDENT_AMBULATORY_CARE_PROVIDER_SITE_OTHER): Payer: 59 | Admitting: Physical Therapy

## 2021-05-31 ENCOUNTER — Encounter: Payer: Self-pay | Admitting: Physical Therapy

## 2021-05-31 DIAGNOSIS — M25661 Stiffness of right knee, not elsewhere classified: Secondary | ICD-10-CM

## 2021-05-31 DIAGNOSIS — R600 Localized edema: Secondary | ICD-10-CM

## 2021-05-31 DIAGNOSIS — M6281 Muscle weakness (generalized): Secondary | ICD-10-CM | POA: Diagnosis not present

## 2021-05-31 DIAGNOSIS — R2689 Other abnormalities of gait and mobility: Secondary | ICD-10-CM | POA: Diagnosis not present

## 2021-05-31 DIAGNOSIS — M25561 Pain in right knee: Secondary | ICD-10-CM

## 2021-05-31 NOTE — Therapy (Signed)
OUTPATIENT PHYSICAL THERAPY TREATMENT NOTE   Patient Name: Tracy Mercado MRN: [id number] DOB:1960-01-05, 61 y.o., female Today's Date: 05/31/2021  PCP: Patient, No Pcp Per (Inactive) REFERRING PROVIDER: Mcarthur Rossetti*  END OF SESSION:   PT End of Session - 05/31/21 1611     Visit Number 9    Number of Visits 16    Date for PT Re-Evaluation 07/01/21    PT Start Time 1600    PT Stop Time 1640    PT Time Calculation (min) 40 min    Activity Tolerance Patient tolerated treatment well    Behavior During Therapy WFL for tasks assessed/performed                   Past Medical History:  Diagnosis Date   Arthritis    CAD (coronary artery disease)    Hypertension    Past Surgical History:  Procedure Laterality Date   HAND SURGERY Left    KNEE ARTHROSCOPY     TOTAL KNEE ARTHROPLASTY Right 04/05/2021   Procedure: Right TOTAL KNEE ARTHROPLASTY;  Surgeon: Mcarthur Rossetti, MD;  Location: [place];  Service: Orthopedics;  Laterality: Right;   Patient Active Problem List   Diagnosis Date Noted   Status post right knee replacement 04/05/2021   Unilateral primary osteoarthritis, right knee 01/10/2021    REFERRING DIAG: X51.700 (ICD-10-CM) - Status post right knee replacement  THERAPY DIAG:  Acute pain of right knee  Stiffness of right knee, not elsewhere classified  Muscle weakness (generalized)  Other abnormalities of gait and mobility  Localized edema  PERTINENT HISTORY: see above PMH  PRECAUTIONS: none  SUBJECTIVE: Pt indicated the MD office switched off oxy to another medicine.  No pain at rest noted.   PAIN:  NPRS scale: 0/10 Pain location: Rt knee Pain description:  Aggravating factors:  Relieving factors:    OBJECTIVE: (objective measures completed at initial evaluation unless otherwise dated)   PATIENT SURVEYS:  05/20/2021: FOTO update 77%  04/22/21: FOTO 29% functional    PALPATION: 04/22/2021: TTP anterior knee   LE  ROM:    AROM/PROM Right 04/22/2021 Rt 04/25/21 Rt 04/26/21 Right 05/20/2021 Right 05/24/21  Hip flexion         Hip extension         Hip abduction         Hip adduction         Hip internal rotation         Hip external rotation         Knee flexion 96/103 100/110  104/109  110/117  Knee extension 10/7 12/8  10/8 AROM -8 LAQ -2 in quad set in supine -4/0  Ankle dorsiflexion         Ankle plantarflexion         Ankle inversion         Ankle eversion          (Blank rows = not tested)   LE MMT:   MMT Right 04/22/2021 Right 05/06/2021 Left 05/06/2021 Right 05/20/2021  Hip flexion 4   5/5  Hip extension       Hip abduction 4     Hip adduction 4-     Hip internal rotation       Hip external rotation       Knee flexion 4     Knee extension 4 5/5 36.6, 37.3 lbs 5/5 60, 64 lbs 5/5  45.3, 42.9 lbs  Ankle dorsiflexion  Ankle plantarflexion       Ankle inversion       Ankle eversion        (Blank rows = not tested)   GAIT: 05/06/2021:  In clinic ambulation independently, cane use reported at times at home and in community       TODAY'S TREATMENT: 05/31/2021 Therex:    Aerobic: Bike L3 x 8 min; seat 8  Machines: Knee extension 10#  3 x 10 eccentric lowering Rt leg, double leg up Hamstring curl 3 x 10 Rt leg only 15 lbs (slow focus control cues) Leg press Rt leg only 2 x 15 43 lbs, then DL 87# 2X15     Standing:  Sidestepping on blue foam 5 round trips  Tandem walk on foam 5 round trips Gastroc stretch 30 sec X 3 bilat 6 inch step up and over/down WB on Rt leg c single hand rail x 10 fwd and X 10 each way lateral  Seated: Sit to stands 2X10 no UE support Seated H.S. stretch 30 sec X 3  05/27/2021 Therex:     Aerobic: Bike L3 x 8 min; seat 8     Machines: Knee extension 10#  3 x 10 eccentric lowering Rt leg, double leg up (slow lowering cues )    Standing: Runner stretch Rt Calf stretch 3x30 sec on slant board c cues for pushing heel into ground        Seated  Seated TKE stretch 3 mins in chair c foot prop 2 lbs on knee Rt   NeuroRe-ed  SLS c vector reaching light touch c contralateral fwd/lateral/reverse x 8 bilateral  Anterior/posterior weight shift on foam 2 mins for ankle strategy improvements  TherActivity (to improve functional strength for stairs, transfers, ambulation)  6 inch lateral step down c TKE blue band 2 x 10  Leg press Rt leg only 2 x 15 37 lbs, DL 81 lbs X15  Hamstring stretch after each set 30 sec Rt leg   05/23/2021 Therex:      Aerobic: Bike L3 x 8 min; seat 8     Machines: Knee extension 5#  3 x 10 eccentric lowering Rt leg, double leg up (slow lowering cues ) Hamstring curl 3 x 10 Rt leg only 10 lbs (slow focus control cues) Leg press Rt leg only 2 x 15 37 lbs, then DL 75# 2X15     Standing:   Sidestepping on blue foam 5 round trips   Tandem walk on foam 5 round trips Blue band TKE hold 5 sec x 15  6 inch step up and over/down WB on Rt leg c single hand rail x 16 fwd and X 10 each way lateral    Seated:  Sit to stands 2X10 no UE support  Seated H.S. stretch 30 sec X 3  Manual therapy  Rt knee PROM with extension emphasis and extension mobs   05/20/2021 Therex:      Aerobic: Bike L3 x 8 min; seat 8     Machines: Knee extension 5#  3 x 10 eccentric lowering Rt leg, double leg up (slow lowering cues ) Hamstring curl 3 x 10 Rt leg only 10 lbs (slow focus control cues)     Standing: Runner stretch Rt Calf stretch 3x30 sec on slant board c cues for pushing heel into ground  Blue band TKE hold 5 sec x 15  TherActivity (to improve functional strength for stairs, transfers, ambulation)  6 inch step up and over/down WB on  Rt leg c single hand rail x 16 Leg press Rt leg only 2 x 15 37 lbs  Hamstring stretch after each set 30 sec Rt leg   PATIENT EDUCATION:  04/26/2021 Education details: Pt instructed to begin walking on level surfaces with no device, gt training performed Person educated:  Patient Education method: Explanation, Demonstration, Verbal cues, and Handouts Education comprehension: verbalized understanding, returned demonstration, and needs further education     HOME EXERCISE PROGRAM: Access Code: [facility] URL: [email or web address] Date: 04/22/2021 Prepared by: Elsie Ra   Exercises - Seated Hamstring Stretch  - 2 x daily - 6 x weekly - 1 sets - 2 reps - 30 hold - Seated Straight Leg Raise with Quad Contraction  - 2 x daily - 6 x weekly - 1-2 sets - 10 reps - Seated Long Arc Quad  - 2 x daily - 6 x weekly - 2 sets - 10 reps - Seated Knee Flexion Stretch  - 2 x daily - 6 x weekly - 1-2 sets - 10 reps - 5 sec hold       ASSESSMENT:   CLINICAL IMPRESSION:  She showed improved strength by being able to progress resistance on weight machines for Rt knee strengthening exercises with good tolerance to this. Overall is making good progress. Continue POC.    OBJECTIVE IMPAIRMENTS: decreased activity tolerance, difficulty walking, decreased balance, decreased endurance, decreased mobility, decreased ROM, decreased strength, impaired flexibility, impaired LE use, postural dysfunction, and pain.   ACTIVITY LIMITATIONS: bending, lifting, carry, locomotion, cleaning, community activity, driving, and or occupation   PERSONAL FACTORS:  Rt TKA, HTN, arthritis and CAD are also affecting patient's functional outcome.   REHAB POTENTIAL: Good   CLINICAL DECISION MAKING: Stable/uncomplicated   EVALUATION COMPLEXITY: Low       GOALS: Short term PT Goals Target date: 05/20/2021 Pt will be I and compliant with HEP. Baseline:  Goal status: MET 05/20/2021 Pt will decrease pain by 25% overall Baseline: Goal status: MET 05/20/2021   Long term PT goals Target date: 07/01/2021 Pt will improve Rt knee PROM 2-115 deg to improve functional mobility Baseline: Goal status: on going 05/20/2021 Pt will improve  Rt hip/knee strength to at least 5-/5 MMT to improve  functional strength Baseline: Goal status: on going 05/20/2021 Pt will improve FOTO to at least 55% functional to show improved function Baseline: Goal status: MET 05/20/2021 Pt will reduce pain by overall 50% overall with usual activity Baseline: Goal status: MET 05/20/2021 Pt will be able to ambulate community distances at least 1000 ft WNL gait pattern without complaints Baseline: Goal status: MET 05/20/2021   PLAN: PT FREQUENCY: 1-2 times per week    PT DURATION: 10 weeks   PLANNED INTERVENTIONS (unless contraindicated): aquatic PT, Canalith repositioning, cryotherapy, Electrical stimulation, Iontophoresis with 4 mg/ml dexamethasome, Moist heat, traction, Ultrasound, gait training, Therapeutic exercise, balance training, neuromuscular re-education, patient/family education, prosthetic training, manual techniques, passive ROM, dry needling, taping, vasopnuematic device, vestibular, spinal manipulations, joint manipulations   PLAN FOR NEXT SESSION: WB strengthening continued, dynamic balance control improvements.    Elsie Ra, PT, DPT 05/31/21 4:30 PM

## 2021-06-03 ENCOUNTER — Encounter: Payer: Self-pay | Admitting: Physical Therapy

## 2021-06-03 ENCOUNTER — Telehealth: Payer: Self-pay | Admitting: Orthopaedic Surgery

## 2021-06-03 ENCOUNTER — Ambulatory Visit (INDEPENDENT_AMBULATORY_CARE_PROVIDER_SITE_OTHER): Payer: 59 | Admitting: Physical Therapy

## 2021-06-03 ENCOUNTER — Other Ambulatory Visit: Payer: Self-pay | Admitting: Orthopaedic Surgery

## 2021-06-03 DIAGNOSIS — M25661 Stiffness of right knee, not elsewhere classified: Secondary | ICD-10-CM

## 2021-06-03 DIAGNOSIS — M25561 Pain in right knee: Secondary | ICD-10-CM

## 2021-06-03 DIAGNOSIS — R2689 Other abnormalities of gait and mobility: Secondary | ICD-10-CM

## 2021-06-03 DIAGNOSIS — M6281 Muscle weakness (generalized): Secondary | ICD-10-CM | POA: Diagnosis not present

## 2021-06-03 DIAGNOSIS — R600 Localized edema: Secondary | ICD-10-CM

## 2021-06-03 MED ORDER — TRAMADOL HCL 50 MG PO TABS: 100.0000 mg | ORAL_TABLET | Freq: Four times a day (QID) | ORAL | 0 refills | Status: DC | PRN

## 2021-06-03 NOTE — Telephone Encounter (Signed)
Pt called requesting a refill of tramadol. Please send to pharmacy on file. Pt phone number is [phone].

## 2021-06-03 NOTE — Telephone Encounter (Signed)
Please advise 

## 2021-06-03 NOTE — Therapy (Signed)
OUTPATIENT PHYSICAL THERAPY TREATMENT NOTE   Patient Name: Tracy Mercado MRN: [id number] DOB:Sep 25, 1960, 61 y.o., female Today's Date: 06/03/2021  PCP: Patient, No Pcp Per (Inactive) REFERRING PROVIDER: Mcarthur Rossetti*  END OF SESSION:   PT End of Session - 06/03/21 1112     Visit Number 10    Number of Visits 16    Date for PT Re-Evaluation 07/01/21    PT Start Time 1100    PT Stop Time 1140    PT Time Calculation (min) 40 min    Activity Tolerance Patient tolerated treatment well    Behavior During Therapy WFL for tasks assessed/performed                   Past Medical History:  Diagnosis Date   Arthritis    CAD (coronary artery disease)    Hypertension    Past Surgical History:  Procedure Laterality Date   HAND SURGERY Left    KNEE ARTHROSCOPY     TOTAL KNEE ARTHROPLASTY Right 04/05/2021   Procedure: Right TOTAL KNEE ARTHROPLASTY;  Surgeon: Mcarthur Rossetti, MD;  Location: [facility];  Service: Orthopedics;  Laterality: Right;   Patient Active Problem List   Diagnosis Date Noted   Status post right knee replacement 04/05/2021   Unilateral primary osteoarthritis, right knee 01/10/2021    REFERRING DIAG: I77.824 (ICD-10-CM) - Status post right knee replacement  THERAPY DIAG:  Acute pain of right knee  Stiffness of right knee, not elsewhere classified  Muscle weakness (generalized)  Other abnormalities of gait and mobility  Localized edema  PERTINENT HISTORY: see above PMH  PRECAUTIONS: none  SUBJECTIVE: She denies pain today, does have stiffness in her knee. Wants to return to work soon  PAIN:  NPRS scale: 0/10 Pain location: Rt knee Pain description:  Aggravating factors:  Relieving factors:    OBJECTIVE: (objective measures completed at initial evaluation unless otherwise dated)   PATIENT SURVEYS:  05/20/2021: FOTO update 77%  04/22/21: FOTO 29% functional    PALPATION: 04/22/2021: TTP anterior knee   LE ROM:     AROM/PROM Right 04/22/2021 Rt 04/25/21 Rt 04/26/21 Right 05/20/2021 Right 05/24/21  Hip flexion         Hip extension         Hip abduction         Hip adduction         Hip internal rotation         Hip external rotation         Knee flexion 96/103 100/110  104/109  110/117  Knee extension 10/7 12/8  10/8 AROM -8 LAQ -2 in quad set in supine -4/0  Ankle dorsiflexion         Ankle plantarflexion         Ankle inversion         Ankle eversion          (Blank rows = not tested)   LE MMT:   MMT Right 04/22/2021 Right 05/06/2021 Left 05/06/2021 Right 05/20/2021 Right 06/03/21  Hip flexion 4   5/5   Hip extension        Hip abduction 4      Hip adduction 4-      Hip internal rotation        Hip external rotation        Knee flexion 4    5/5  Knee extension 4 5/5 36.6, 37.3 lbs 5/5 60, 64 lbs 5/5  45.3, 42.9 lbs  5/5  Ankle dorsiflexion        Ankle plantarflexion        Ankle inversion        Ankle eversion         (Blank rows = not tested)   GAIT: 05/06/2021:  In clinic ambulation independently, cane use reported at times at home and in community       TODAY'S TREATMENT: 06/03/2021 Therex:    Aerobic: Nu step L6 X 10 min  Machines: Knee extension 10#  3 x 10 eccentric lowering Rt leg, double leg up Hamstring curl 3 x 10 Rt leg only 15 lbs (slow focus control cues) Leg press Rt leg only 2 x 15 43 lbs, then DL 87# 2X15     Standing:  Tandem walk at counter top 3 trips fwd and 3 trips retro Gastroc stretch 30 sec X 3 bilat TRX squats 2X10 TRX lunges X 10 bilat  Seated: Sit to stands 2X10 no UE support from low mat slightly lower than chair Seated H.S. stretch 30 sec X 3  05/31/2021 Therex:    Aerobic: Bike L3 x 8 min; seat 8  Machines: Knee extension 10#  3 x 10 eccentric lowering Rt leg, double leg up Hamstring curl 3 x 10 Rt leg only 15 lbs (slow focus control cues) Leg press Rt leg only 2 x 15 43 lbs, then DL 87# 2X15     Standing:  Sidestepping on blue foam  5 round trips  Tandem walk on foam 5 round trips Gastroc stretch 30 sec X 3 bilat 6 inch step up and over/down WB on Rt leg c single hand rail x 10 fwd and X 10 each way lateral  Seated: Sit to stands 2X10 no UE support Seated H.S. stretch 30 sec X 3  05/27/2021 Therex:     Aerobic: Bike L3 x 8 min; seat 8     Machines: Knee extension 10#  3 x 10 eccentric lowering Rt leg, double leg up (slow lowering cues )    Standing: Runner stretch Rt Calf stretch 3x30 sec on slant board c cues for pushing heel into ground       Seated  Seated TKE stretch 3 mins in chair c foot prop 2 lbs on knee Rt   NeuroRe-ed  SLS c vector reaching light touch c contralateral fwd/lateral/reverse x 8 bilateral  Anterior/posterior weight shift on foam 2 mins for ankle strategy improvements  TherActivity (to improve functional strength for stairs, transfers, ambulation)  6 inch lateral step down c TKE blue band 2 x 10  Leg press Rt leg only 2 x 15 37 lbs, DL 81 lbs X15  Hamstring stretch after each set 30 sec Rt leg   05/23/2021 Therex:      Aerobic: Bike L3 x 8 min; seat 8     Machines: Knee extension 5#  3 x 10 eccentric lowering Rt leg, double leg up (slow lowering cues ) Hamstring curl 3 x 10 Rt leg only 10 lbs (slow focus control cues) Leg press Rt leg only 2 x 15 37 lbs, then DL 75# 2X15     Standing:   Sidestepping on blue foam 5 round trips   Tandem walk on foam 5 round trips Blue band TKE hold 5 sec x 15  6 inch step up and over/down WB on Rt leg c single hand rail x 16 fwd and X 10 each way lateral    Seated:  Sit to stands 2X10 no  UE support  Seated H.S. stretch 30 sec X 3  Manual therapy  Rt knee PROM with extension emphasis and extension mobs   05/20/2021 Therex:      Aerobic: Bike L3 x 8 min; seat 8     Machines: Knee extension 5#  3 x 10 eccentric lowering Rt leg, double leg up (slow lowering cues ) Hamstring curl 3 x 10 Rt leg only 10 lbs (slow focus control cues)      Standing: Runner stretch Rt Calf stretch 3x30 sec on slant board c cues for pushing heel into ground  Blue band TKE hold 5 sec x 15  TherActivity (to improve functional strength for stairs, transfers, ambulation)  6 inch step up and over/down WB on Rt leg c single hand rail x 16 Leg press Rt leg only 2 x 15 37 lbs  Hamstring stretch after each set 30 sec Rt leg   PATIENT EDUCATION:  04/26/2021 Education details: Pt instructed to begin walking on level surfaces with no device, gt training performed Person educated: Patient Education method: Explanation, Demonstration, Verbal cues, and Handouts Education comprehension: verbalized understanding, returned demonstration, and needs further education     HOME EXERCISE PROGRAM: Access Code: [facility] URL: [email or web address] Date: 04/22/2021 Prepared by: Elsie Ra   Exercises - Seated Hamstring Stretch  - 2 x daily - 6 x weekly - 1 sets - 2 reps - 30 hold - Seated Straight Leg Raise with Quad Contraction  - 2 x daily - 6 x weekly - 1-2 sets - 10 reps - Seated Long Arc Quad  - 2 x daily - 6 x weekly - 2 sets - 10 reps - Seated Knee Flexion Stretch  - 2 x daily - 6 x weekly - 1-2 sets - 10 reps - 5 sec hold       ASSESSMENT:   CLINICAL IMPRESSION:  She has done very well with PT, she has good overall ROM and strength noted in Rt knee. She has one more PT visit left scheduled and will likely be ready for discharge then if MD is in agreement. She does have a physical job that she wants to return to. I feel she may need to start with half days for first 2 weeks or so as an 8 hour standing day will be difficult to return to right away   OBJECTIVE IMPAIRMENTS: decreased activity tolerance, difficulty walking, decreased balance, decreased endurance, decreased mobility, decreased ROM, decreased strength, impaired flexibility, impaired LE use, postural dysfunction, and pain.   ACTIVITY LIMITATIONS: bending, lifting, carry,  locomotion, cleaning, community activity, driving, and or occupation   PERSONAL FACTORS:  Rt TKA, HTN, arthritis and CAD are also affecting patient's functional outcome.   REHAB POTENTIAL: Good   CLINICAL DECISION MAKING: Stable/uncomplicated   EVALUATION COMPLEXITY: Low       GOALS: Short term PT Goals Target date: 05/20/2021 Pt will be I and compliant with HEP. Baseline:  Goal status: MET 05/20/2021 Pt will decrease pain by 25% overall Baseline: Goal status: MET 05/20/2021   Long term PT goals Target date: 07/01/2021 Pt will improve Rt knee PROM 2-115 deg to improve functional mobility Baseline: Goal status: on going 05/20/2021 Pt will improve  Rt hip/knee strength to at least 5-/5 MMT to improve functional strength Baseline: Goal status: on going 05/20/2021 Pt will improve FOTO to at least 55% functional to show improved function Baseline: Goal status: MET 05/20/2021 Pt will reduce pain by overall 50% overall with usual activity Baseline: Goal status:  MET 05/20/2021 Pt will be able to ambulate community distances at least 1000 ft WNL gait pattern without complaints Baseline: Goal status: MET 05/20/2021   PLAN: PT FREQUENCY: 1-2 times per week    PT DURATION: 10 weeks   PLANNED INTERVENTIONS (unless contraindicated): aquatic PT, Canalith repositioning, cryotherapy, Electrical stimulation, Iontophoresis with 4 mg/ml dexamethasome, Moist heat, traction, Ultrasound, gait training, Therapeutic exercise, balance training, neuromuscular re-education, patient/family education, prosthetic training, manual techniques, passive ROM, dry needling, taping, vasopnuematic device, vestibular, spinal manipulations, joint manipulations   PLAN FOR NEXT SESSION: MD note    Elsie Ra, PT, DPT 06/03/21 11:15 AM

## 2021-06-03 NOTE — Telephone Encounter (Signed)
[employer] forms received. To [employer]. 

## 2021-06-10 ENCOUNTER — Ambulatory Visit (INDEPENDENT_AMBULATORY_CARE_PROVIDER_SITE_OTHER): Payer: 59 | Admitting: Physical Therapy

## 2021-06-10 ENCOUNTER — Encounter: Payer: Self-pay | Admitting: Physical Therapy

## 2021-06-10 DIAGNOSIS — M25661 Stiffness of right knee, not elsewhere classified: Secondary | ICD-10-CM

## 2021-06-10 DIAGNOSIS — M25561 Pain in right knee: Secondary | ICD-10-CM

## 2021-06-10 DIAGNOSIS — M6281 Muscle weakness (generalized): Secondary | ICD-10-CM

## 2021-06-10 DIAGNOSIS — R2689 Other abnormalities of gait and mobility: Secondary | ICD-10-CM | POA: Diagnosis not present

## 2021-06-10 DIAGNOSIS — R600 Localized edema: Secondary | ICD-10-CM

## 2021-06-10 NOTE — Therapy (Signed)
OUTPATIENT PHYSICAL THERAPY TREATMENT NOTE PHYSICAL THERAPY DISCHARGE SUMMARY  Visits from Start of Care: 11  Current functional level related to goals / functional outcomes: See below   Remaining deficits: See below   Education / Equipment: HEP  Plan:  She agrees to discharge. Patient goals were met. Patient is being discharged due to meeting the PT goals and is pleased with her current functional level.      Patient Name: Tracy Mercado MRN: [id number] DOB:Dec 04, 1960, 61 y.o., female Today's Date: 06/10/2021  PCP: Patient, No Pcp Per (Inactive) REFERRING PROVIDER: Mcarthur Rossetti*  END OF SESSION:   PT End of Session - 06/10/21 0806     Visit Number 11    Number of Visits 16    Date for PT Re-Evaluation 07/01/21    PT Start Time 0800    PT Stop Time 0840    PT Time Calculation (min) 40 min    Activity Tolerance Patient tolerated treatment well    Behavior During Therapy [facility] for tasks assessed/performed                   Past Medical History:  Diagnosis Date   Arthritis    CAD (coronary artery disease)    Hypertension    Past Surgical History:  Procedure Laterality Date   HAND SURGERY Left    KNEE ARTHROSCOPY     TOTAL KNEE ARTHROPLASTY Right 04/05/2021   Procedure: Right TOTAL KNEE ARTHROPLASTY;  Surgeon: Mcarthur Rossetti, MD;  Location: [facility];  Service: Orthopedics;  Laterality: Right;   Patient Active Problem List   Diagnosis Date Noted   Status post right knee replacement 04/05/2021   Unilateral primary osteoarthritis, right knee 01/10/2021    REFERRING DIAG: Z96.651 (ICD-10-CM) - Status post right knee replacement  THERAPY DIAG:  Acute pain of right knee  Stiffness of right knee, not elsewhere classified  Muscle weakness (generalized)  Other abnormalities of gait and mobility  Localized edema  PERTINENT HISTORY: see above PMH  PRECAUTIONS: none  SUBJECTIVE: She relays she is ready to return to work  PAIN:   NPRS scale: 0/10 Pain location: Rt knee Pain description:  Aggravating factors:  Relieving factors:    OBJECTIVE: (objective measures completed at initial evaluation unless otherwise dated)   PATIENT SURVEYS:  05/20/2021: FOTO update 77%  04/22/21: FOTO 29% functional 06/10/21: FOTO 90% functional    PALPATION: 04/22/2021: TTP anterior knee   LE ROM:    AROM/PROM Right 04/22/2021 Rt 04/25/21 Rt 04/26/21 Right 05/20/2021 Right 05/24/21 Right 06/10/21  Hip flexion          Hip extension          Hip abduction          Hip adduction          Hip internal rotation          Hip external rotation          Knee flexion 96/103 100/110  104/109  110/117 112/120  Knee extension 10/7 12/8  10/8 AROM -8 LAQ -2 in quad set in supine -4/0 -4/0  Ankle dorsiflexion          Ankle plantarflexion          Ankle inversion          Ankle eversion           (Blank rows = not tested)   LE MMT:   MMT Right 04/22/2021 Right 05/06/2021 Left 05/06/2021 Right 05/20/2021 Right 06/03/21  Hip flexion 4   5/5   Hip extension        Hip abduction 4      Hip adduction 4-      Hip internal rotation        Hip external rotation        Knee flexion 4    5/5  Knee extension 4 5/5 36.6, 37.3 lbs 5/5 60, 64 lbs 5/5  45.3, 42.9 lbs 5/5  Ankle dorsiflexion        Ankle plantarflexion        Ankle inversion        Ankle eversion         (Blank rows = not tested)   GAIT: 05/06/2021:  In clinic ambulation independently, cane use reported at times at home and in community       TODAY'S TREATMENT: 06/10/2021 Therex:    Aerobic: Recumbent bike 10 min L5  Machines: Knee extension 10#  3 x 10 eccentric lowering Rt leg, double leg up Hamstring curl 3 x 10 Rt leg only 20 lbs (slow focus control cues) Leg press Rt leg only 2 x 15 50 lbs, then DL 93# 2X15     Standing:  Step ups 6 inch up with Rt and down in front with left X 10  Lateral step ups 6 inch X 10 bilat Gastroc stretch 30 sec X 3 bilat TRX  squats X10 holding 5 sec TRX lunges X 10 bilat  Seated: Seated H.S. stretch 30 sec X 3  06/03/2021 Therex:    Aerobic: Nu step L6 X 10 min  Machines: Knee extension 10#  3 x 10 eccentric lowering Rt leg, double leg up Hamstring curl 3 x 10 Rt leg only 15 lbs (slow focus control cues) Leg press Rt leg only 2 x 15 43 lbs, then DL 87# 2X15     Standing:  Tandem walk at counter top 3 trips fwd and 3 trips retro Gastroc stretch 30 sec X 3 bilat TRX squats 2X10 TRX lunges X 10 bilat  Seated: Sit to stands 2X10 no UE support from low mat slightly lower than chair Seated H.S. stretch 30 sec X 3  05/31/2021 Therex:    Aerobic: Bike L3 x 8 min; seat 8  Machines: Knee extension 10#  3 x 10 eccentric lowering Rt leg, double leg up Hamstring curl 3 x 10 Rt leg only 15 lbs (slow focus control cues) Leg press Rt leg only 2 x 15 43 lbs, then DL 87# 2X15     Standing:  Sidestepping on blue foam 5 round trips  Tandem walk on foam 5 round trips Gastroc stretch 30 sec X 3 bilat 6 inch step up and over/down WB on Rt leg c single hand rail x 10 fwd and X 10 each way lateral  Seated: Sit to stands 2X10 no UE support Seated H.S. stretch 30 sec X 3  05/27/2021 Therex:     Aerobic: Bike L3 x 8 min; seat 8     Machines: Knee extension 10#  3 x 10 eccentric lowering Rt leg, double leg up (slow lowering cues )    Standing: Runner stretch Rt Calf stretch 3x30 sec on slant board c cues for pushing heel into ground       Seated  Seated TKE stretch 3 mins in chair c foot prop 2 lbs on knee Rt   NeuroRe-ed  SLS c vector reaching light touch c contralateral fwd/lateral/reverse x 8 bilateral  Anterior/posterior weight  shift on foam 2 mins for ankle strategy improvements  TherActivity (to improve functional strength for stairs, transfers, ambulation)  6 inch lateral step down c TKE blue band 2 x 10  Leg press Rt leg only 2 x 15 37 lbs, DL 81 lbs X15  Hamstring stretch after each set 30 sec Rt  leg   05/23/2021 Therex:      Aerobic: Bike L3 x 8 min; seat 8     Machines: Knee extension 5#  3 x 10 eccentric lowering Rt leg, double leg up (slow lowering cues ) Hamstring curl 3 x 10 Rt leg only 10 lbs (slow focus control cues) Leg press Rt leg only 2 x 15 37 lbs, then DL 75# 2X15     Standing:   Sidestepping on blue foam 5 round trips   Tandem walk on foam 5 round trips Blue band TKE hold 5 sec x 15  6 inch step up and over/down WB on Rt leg c single hand rail x 16 fwd and X 10 each way lateral    Seated:  Sit to stands 2X10 no UE support  Seated H.S. stretch 30 sec X 3  Manual therapy  Rt knee PROM with extension emphasis and extension mobs   05/20/2021 Therex:      Aerobic: Bike L3 x 8 min; seat 8     Machines: Knee extension 5#  3 x 10 eccentric lowering Rt leg, double leg up (slow lowering cues ) Hamstring curl 3 x 10 Rt leg only 10 lbs (slow focus control cues)     Standing: Runner stretch Rt Calf stretch 3x30 sec on slant board c cues for pushing heel into ground  Blue band TKE hold 5 sec x 15  TherActivity (to improve functional strength for stairs, transfers, ambulation)  6 inch step up and over/down WB on Rt leg c single hand rail x 16 Leg press Rt leg only 2 x 15 37 lbs  Hamstring stretch after each set 30 sec Rt leg   PATIENT EDUCATION:  04/26/2021 Education details: Pt instructed to begin walking on level surfaces with no device, gt training performed Person educated: Patient Education method: Consulting civil engineer, Demonstration, Verbal cues, and Handouts Education comprehension: verbalized understanding, returned demonstration, and needs further education     HOME EXERCISE PROGRAM: Access Code: [facility] URL: [email or web address] Date: 04/22/2021 Prepared by: Elsie Ra   Exercises - Seated Hamstring Stretch  - 2 x daily - 6 x weekly - 1 sets - 2 reps - 30 hold - Seated Straight Leg Raise with Quad Contraction  - 2 x daily - 6 x weekly  - 1-2 sets - 10 reps - Seated Long Arc Quad  - 2 x daily - 6 x weekly - 2 sets - 10 reps - Seated Knee Flexion Stretch  - 2 x daily - 6 x weekly - 1-2 sets - 10 reps - 5 sec hold       ASSESSMENT:   CLINICAL IMPRESSION:  She has done excellent with PT and now met all PT goals and will be discharged to independent program. She will follow up with MD next week and she feels ready to return to work. I do feel she should start with half days of her normal physical job and slowly work her way back to full days. She had no further questions or concerns about discharge today   OBJECTIVE IMPAIRMENTS: decreased activity tolerance, difficulty walking, decreased balance, decreased endurance, decreased mobility, decreased ROM, decreased strength, impaired  flexibility, impaired LE use, postural dysfunction, and pain.   ACTIVITY LIMITATIONS: bending, lifting, carry, locomotion, cleaning, community activity, driving, and or occupation   PERSONAL FACTORS:  Rt TKA, HTN, arthritis and CAD are also affecting patient's functional outcome.   REHAB POTENTIAL: Good   CLINICAL DECISION MAKING: Stable/uncomplicated   EVALUATION COMPLEXITY: Low       GOALS: Short term PT Goals Target date: 05/20/2021 Pt will be I and compliant with HEP. Baseline:  Goal status: MET 05/20/2021 Pt will decrease pain by 25% overall Baseline: Goal status: MET 05/20/2021   Long term PT goals Target date: 07/01/2021 Pt will improve Rt knee PROM 2-115 deg to improve functional mobility Baseline: Goal status: MET 06/10/21 Pt will improve  Rt hip/knee strength to at least 5-/5 MMT to improve functional strength Baseline: Goal status: MET 06/11/31 Pt will improve FOTO to at least 90% functional to show improved function Baseline: Goal status: MET 05/20/2021 Pt will reduce pain by overall 50% overall with usual activity Baseline: Goal status: MET 05/20/2021 Pt will be able to ambulate community distances at least 1000 ft WNL gait  pattern without complaints Baseline: Goal status: MET 05/20/2021   PLAN: PT FREQUENCY: 1-2 times per week    PT DURATION: 10 weeks   PLANNED INTERVENTIONS (unless contraindicated): aquatic PT, Canalith repositioning, cryotherapy, Electrical stimulation, Iontophoresis with 4 mg/ml dexamethasome, Moist heat, traction, Ultrasound, gait training, Therapeutic exercise, balance training, neuromuscular re-education, patient/family education, prosthetic training, manual techniques, passive ROM, dry needling, taping, vasopnuematic device, vestibular, spinal manipulations, joint manipulations   PLAN FOR NEXT SESSION: DC today   Elsie Ra, PT, DPT 06/10/21 8:08 AM

## 2021-06-13 ENCOUNTER — Telehealth: Payer: Self-pay | Admitting: Orthopaedic Surgery

## 2021-06-13 ENCOUNTER — Encounter: Payer: Self-pay | Admitting: Orthopaedic Surgery

## 2021-06-13 ENCOUNTER — Ambulatory Visit (INDEPENDENT_AMBULATORY_CARE_PROVIDER_SITE_OTHER): Payer: 59 | Admitting: Orthopaedic Surgery

## 2021-06-13 ENCOUNTER — Encounter: Payer: 59 | Admitting: Physical Therapy

## 2021-06-13 DIAGNOSIS — Z96651 Presence of right artificial knee joint: Secondary | ICD-10-CM

## 2021-06-13 NOTE — Telephone Encounter (Signed)
Received $25.00 cash, medical records release form/ Forwarding to [employer] today ?

## 2021-06-13 NOTE — Progress Notes (Signed)
The patient is now 8 and half weeks status post a right total knee arthroplasty.  She reports good progress and is ready to return to work starting next week.  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  I agree with her being able to return to work but will have restrictions of lifting no greater than 20 pounds for the first 6 weeks and to be allowed to rest her knee 15 minutes at a time twice per shift.  She does have some swelling of her right knee to be expected at 8 and half weeks postoperative.  However, her range of motion is excellent and full and the knee feels stable.  From my standpoint she is released to return to work as mentioned above.  We will see her back in 3 months with a standing AP and lateral of her right operative knee.

## 2021-06-17 ENCOUNTER — Other Ambulatory Visit: Payer: Self-pay | Admitting: Orthopaedic Surgery

## 2021-06-17 ENCOUNTER — Telehealth: Payer: Self-pay | Admitting: Orthopaedic Surgery

## 2021-06-17 MED ORDER — TRAMADOL HCL 50 MG PO TABS: 100.0000 mg | ORAL_TABLET | Freq: Four times a day (QID) | ORAL | 0 refills | Status: DC | PRN

## 2021-06-17 NOTE — Telephone Encounter (Signed)
Patient called needing Rx refilled (Tramadol) The number to contact patient is [phone]

## 2021-07-07 ENCOUNTER — Telehealth: Payer: Self-pay | Admitting: Orthopaedic Surgery

## 2021-07-07 ENCOUNTER — Other Ambulatory Visit: Payer: Self-pay | Admitting: Orthopaedic Surgery

## 2021-07-07 MED ORDER — TEMAZEPAM 7.5 MG PO CAPS: 7.5000 mg | ORAL_CAPSULE | Freq: Every evening | ORAL | 0 refills | Status: DC | PRN

## 2021-07-07 NOTE — Telephone Encounter (Signed)
I called and talked to the pt. 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 She wanted to know what to do for her pain. I advised pt to wear knee sleeve she has and take ibu and tylenol during the day. Pt stated understanding and would try that and would try the sleep medication.

## 2021-07-07 NOTE — Telephone Encounter (Signed)
Please advise 

## 2021-07-07 NOTE — Telephone Encounter (Signed)
Pt called requesting a call back. Pt states she need something to help her sleep. She states she dont want any narcotic pain meds but would like to discuss other med to help her sleep. Pt states tramadol is not helping. Please call pt at [phone].

## 2021-07-14 ENCOUNTER — Other Ambulatory Visit: Payer: Self-pay | Admitting: Orthopaedic Surgery

## 2021-07-14 MED ORDER — TRAMADOL HCL 50 MG PO TABS: 100.0000 mg | ORAL_TABLET | Freq: Four times a day (QID) | ORAL | 0 refills | Status: DC | PRN

## 2021-07-14 NOTE — Telephone Encounter (Signed)
Please advise 

## 2021-07-14 NOTE — Telephone Encounter (Signed)
Patient called in for refill on Tramadol

## 2021-07-20 ENCOUNTER — Other Ambulatory Visit: Payer: Self-pay | Admitting: Orthopaedic Surgery

## 2021-07-20 ENCOUNTER — Telehealth: Payer: Self-pay | Admitting: Orthopaedic Surgery

## 2021-07-20 MED ORDER — TEMAZEPAM 15 MG PO CAPS: 15.0000 mg | ORAL_CAPSULE | Freq: Every evening | ORAL | 0 refills | Status: DC | PRN

## 2021-07-20 NOTE — Telephone Encounter (Signed)
Please advise 

## 2021-07-20 NOTE — Telephone Encounter (Signed)
Pt called requesting a refill of temazepam. Pt states she need 15 mg instead of 7.5. Please send to pharmacy on file. Pt phone number is [phone]

## 2021-07-21 NOTE — Telephone Encounter (Signed)
Pt was called and advised and stated understanding. Will contact her pcp in the future

## 2021-08-01 ENCOUNTER — Telehealth: Payer: Self-pay | Admitting: Orthopaedic Surgery

## 2021-08-01 ENCOUNTER — Other Ambulatory Visit: Payer: Self-pay | Admitting: Orthopaedic Surgery

## 2021-08-01 MED ORDER — TRAMADOL HCL 50 MG PO TABS: 50.0000 mg | ORAL_TABLET | Freq: Four times a day (QID) | ORAL | 0 refills | Status: DC | PRN

## 2021-08-01 MED ORDER — TIZANIDINE HCL 4 MG PO TABS: ORAL_TABLET | ORAL | 1 refills | Status: DC

## 2021-08-01 NOTE — Telephone Encounter (Signed)
Refill on zanaflex and tramadol

## 2021-08-02 ENCOUNTER — Telehealth: Payer: Self-pay | Admitting: Orthopaedic Surgery

## 2021-08-02 NOTE — Telephone Encounter (Signed)
Pt called requesting a card for right total knee replacement. Please call pt when ready for pick up. Pt phone number is [phone]

## 2021-08-03 NOTE — Telephone Encounter (Signed)
Patient aware card ready at front desk

## 2021-08-16 ENCOUNTER — Telehealth: Payer: Self-pay | Admitting: Orthopaedic Surgery

## 2021-08-16 ENCOUNTER — Other Ambulatory Visit: Payer: Self-pay | Admitting: Physician Assistant

## 2021-08-16 MED ORDER — TRAMADOL HCL 50 MG PO TABS: 50.0000 mg | ORAL_TABLET | Freq: Four times a day (QID) | ORAL | 0 refills | Status: DC | PRN

## 2021-08-16 NOTE — Telephone Encounter (Signed)
Pt's wife called requesting refill of tramadol for pt. Pt phone number is [phone].

## 2021-08-16 NOTE — Telephone Encounter (Signed)
Please advise 

## 2021-08-17 NOTE — Telephone Encounter (Signed)
Tried calling pt to inform. No answer

## 2021-09-01 ENCOUNTER — Telehealth: Payer: Self-pay | Admitting: Orthopaedic Surgery

## 2021-09-01 ENCOUNTER — Other Ambulatory Visit: Payer: Self-pay | Admitting: Orthopaedic Surgery

## 2021-09-01 MED ORDER — TRAMADOL HCL 50 MG PO TABS
50.0000 mg | ORAL_TABLET | Freq: Four times a day (QID) | ORAL | 0 refills | Status: DC | PRN
Start: 2021-09-01 — End: 2021-09-20

## 2021-09-01 NOTE — Telephone Encounter (Signed)
Refill on tramadol   Cb [phone]

## 2021-09-02 ENCOUNTER — Telehealth: Payer: Self-pay | Admitting: Orthopaedic Surgery

## 2021-09-02 ENCOUNTER — Other Ambulatory Visit: Payer: Self-pay | Admitting: Orthopaedic Surgery

## 2021-09-02 MED ORDER — TIZANIDINE HCL 4 MG PO TABS: ORAL_TABLET | ORAL | 1 refills | Status: DC

## 2021-09-02 NOTE — Telephone Encounter (Signed)
Received call from [place] advised patient need Rx refilled Tizanidine. The number to contact Harriett Sine is [phone]

## 2021-09-02 NOTE — Telephone Encounter (Signed)
Please advise 

## 2021-09-13 ENCOUNTER — Ambulatory Visit (INDEPENDENT_AMBULATORY_CARE_PROVIDER_SITE_OTHER): Payer: Medicaid Other | Admitting: Orthopaedic Surgery

## 2021-09-13 ENCOUNTER — Encounter: Payer: Self-pay | Admitting: Orthopaedic Surgery

## 2021-09-13 ENCOUNTER — Ambulatory Visit (INDEPENDENT_AMBULATORY_CARE_PROVIDER_SITE_OTHER): Payer: Medicaid Other

## 2021-09-13 DIAGNOSIS — Z96651 Presence of right artificial knee joint: Secondary | ICD-10-CM | POA: Diagnosis not present

## 2021-09-13 MED ORDER — GABAPENTIN 300 MG PO CAPS: 300.0000 mg | ORAL_CAPSULE | Freq: Every evening | ORAL | 1 refills | Status: DC | PRN

## 2021-09-13 NOTE — Progress Notes (Signed)
The patient is now just over 5 months status post a right total knee arthroplasty.  She reports good range of motion and strength and occasional sharp burning pain at night.  She says she is doing well.  Her right knee looks good overall.  There are some slight swelling to be expected.  Her range of motion is full and the knee feels ligamentously stable.  2 views of the right knee standing show well-seated total knee arthroplasty with no complicating features.  At this point the next time I need to see her back is not for 6 months unless she is having any issues.  At that visit we will have a final AP and lateral the right knee.  I will send in some Neurontin to try as needed at night.

## 2021-09-20 ENCOUNTER — Other Ambulatory Visit: Payer: Self-pay | Admitting: Orthopaedic Surgery

## 2021-09-20 ENCOUNTER — Telehealth: Payer: Self-pay | Admitting: Orthopaedic Surgery

## 2021-09-20 MED ORDER — TRAMADOL HCL 50 MG PO TABS: 50.0000 mg | ORAL_TABLET | Freq: Four times a day (QID) | ORAL | 0 refills | Status: DC | PRN

## 2021-09-20 MED ORDER — TIZANIDINE HCL 4 MG PO TABS: ORAL_TABLET | ORAL | 1 refills | Status: DC

## 2021-09-20 NOTE — Telephone Encounter (Signed)
Patient called asking for refills on Tramadol and Zanaflex. CB # [phone]

## 2021-09-20 NOTE — Telephone Encounter (Signed)
Please advise 

## 2021-09-23 ENCOUNTER — Telehealth: Payer: Self-pay | Admitting: Orthopaedic Surgery

## 2021-09-23 NOTE — Telephone Encounter (Signed)
Called and advised pt. She stated understanding  

## 2021-09-23 NOTE — Telephone Encounter (Signed)
Pt called requesting a right knee brace. She is back at work and need something to help with swelling. Please call pt about this matter at [phone].

## 2021-09-23 NOTE — Telephone Encounter (Signed)
Please advise 

## 2021-10-07 ENCOUNTER — Other Ambulatory Visit: Payer: Self-pay | Admitting: Orthopaedic Surgery

## 2021-10-07 ENCOUNTER — Telehealth: Payer: Self-pay | Admitting: Orthopaedic Surgery

## 2021-10-07 MED ORDER — GABAPENTIN 300 MG PO CAPS: 300.0000 mg | ORAL_CAPSULE | Freq: Every evening | ORAL | 1 refills | Status: DC | PRN

## 2021-10-07 NOTE — Telephone Encounter (Signed)
Patient called in requesting refill of gabapentin please advise

## 2021-10-11 IMAGING — DX DG FOOT COMPLETE 3+V*R*
3 series · 3 of 3 positions shown · non-contrast
Comparison: November 25, 2013

CLINICAL DATA: Status post trauma.

EXAM:
RIGHT FOOT COMPLETE - 3+ VIEW

[foot ap]
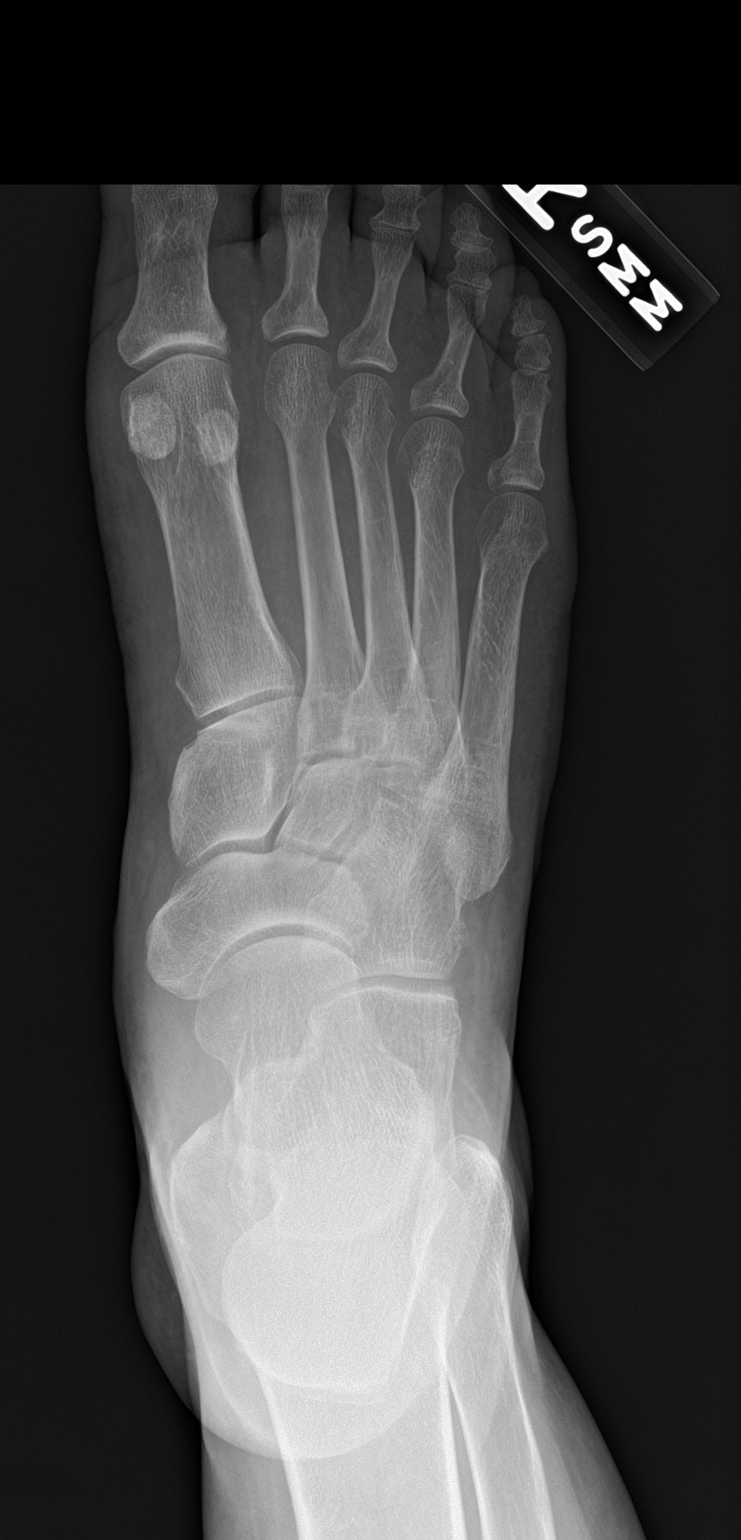

[foot obl]
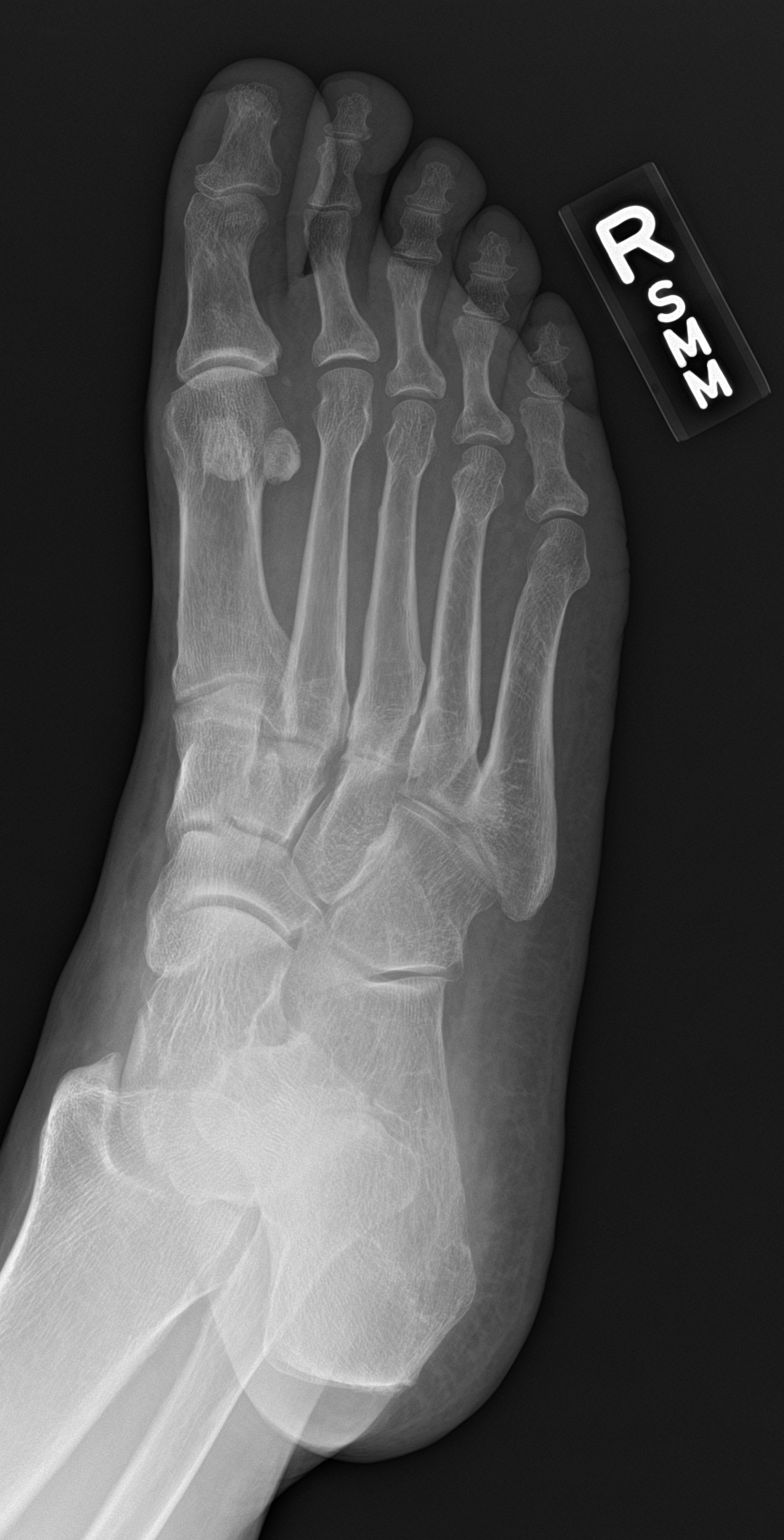

[foot lat]
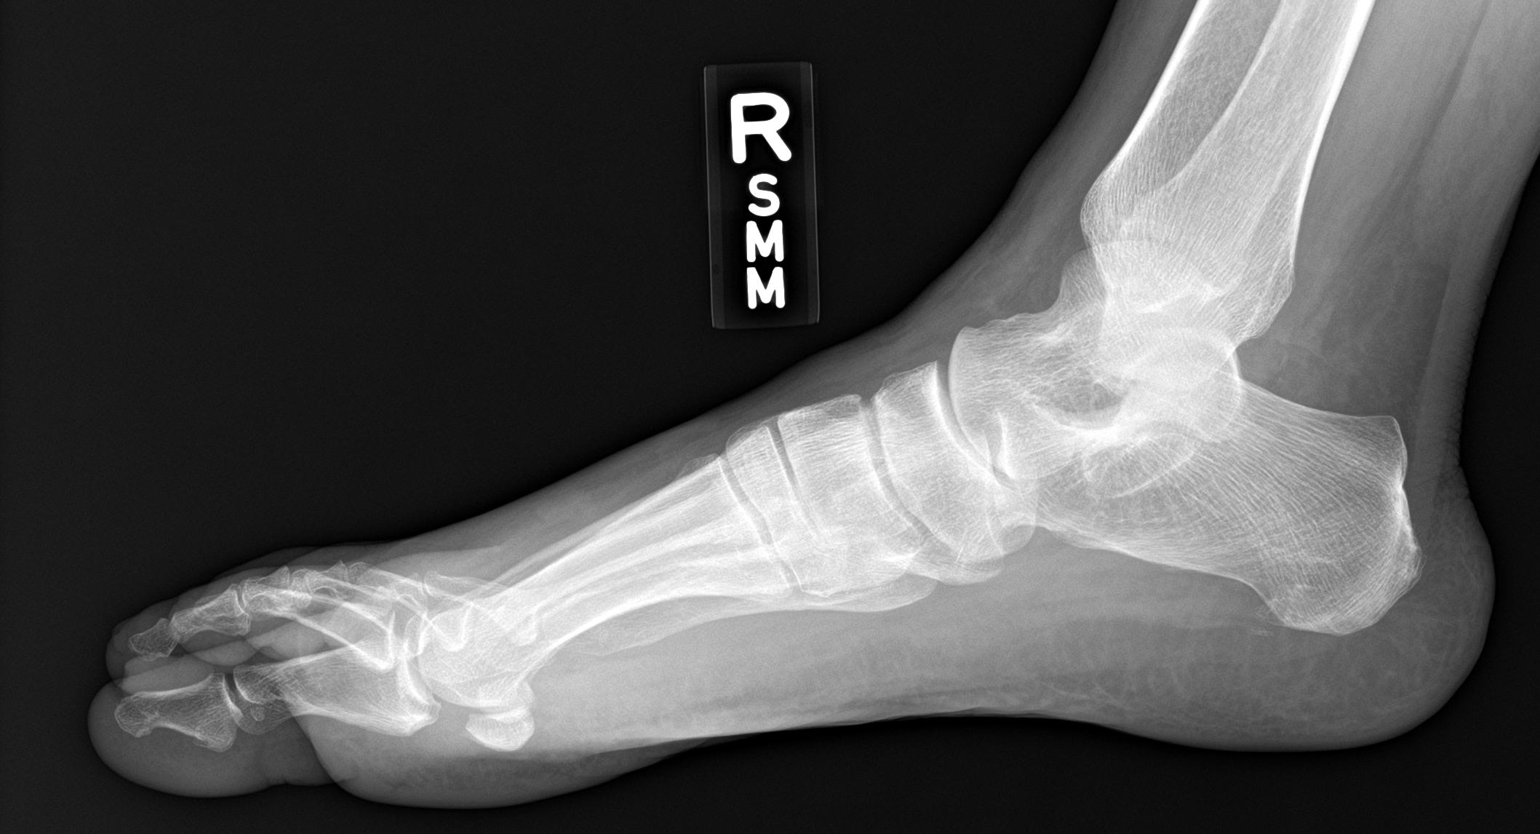

[3 of 3 positions shown; findings below may reference images not displayed]

FINDINGS: There is no evidence of acute fracture or dislocation. A small
chronic fracture deformity is seen involving the proximal phalanx of
the fifth right toe. There is no evidence of arthropathy or other
focal bone abnormality. Soft tissues are unremarkable.
IMPRESSION: No acute fracture or dislocation.

## 2021-10-26 ENCOUNTER — Telehealth: Payer: Self-pay | Admitting: Orthopaedic Surgery

## 2021-11-09 ENCOUNTER — Other Ambulatory Visit: Payer: Self-pay | Admitting: Orthopaedic Surgery

## 2021-11-09 MED ORDER — TIZANIDINE HCL 4 MG PO TABS: ORAL_TABLET | ORAL | 1 refills | Status: DC

## 2021-11-09 MED ORDER — TRAMADOL HCL 50 MG PO TABS: 50.0000 mg | ORAL_TABLET | Freq: Two times a day (BID) | ORAL | 0 refills | Status: DC | PRN

## 2021-11-09 NOTE — Telephone Encounter (Signed)
Pt's wife Harriett Sine called requesting a refill for pt. Pt needs refills of tramadol, and tizanidine. Please send to pharmacy on file. Pt phone number is [phone]

## 2021-11-09 NOTE — Telephone Encounter (Signed)
Patient aware of the below message  

## 2022-01-31 ENCOUNTER — Other Ambulatory Visit: Payer: Self-pay | Admitting: Orthopaedic Surgery

## 2022-02-05 ENCOUNTER — Other Ambulatory Visit: Payer: Self-pay | Admitting: Orthopaedic Surgery

## 2022-03-09 ENCOUNTER — Encounter: Payer: Self-pay | Admitting: Radiology

## 2022-03-14 ENCOUNTER — Encounter: Payer: Self-pay | Admitting: Physician Assistant

## 2022-03-14 ENCOUNTER — Ambulatory Visit (INDEPENDENT_AMBULATORY_CARE_PROVIDER_SITE_OTHER): Payer: Medicaid Other

## 2022-03-14 ENCOUNTER — Ambulatory Visit (INDEPENDENT_AMBULATORY_CARE_PROVIDER_SITE_OTHER): Payer: Medicaid Other | Admitting: Physician Assistant

## 2022-03-14 DIAGNOSIS — Z96651 Presence of right artificial knee joint: Secondary | ICD-10-CM

## 2022-03-14 DIAGNOSIS — M79604 Pain in right leg: Secondary | ICD-10-CM

## 2022-03-14 MED ORDER — METHYLPREDNISOLONE 4 MG PO TABS: ORAL_TABLET | ORAL | 0 refills | Status: DC

## 2022-03-14 MED ORDER — TIZANIDINE HCL 4 MG PO TABS: ORAL_TABLET | ORAL | 1 refills | Status: DC

## 2022-03-14 MED ORDER — GABAPENTIN 300 MG PO CAPS: ORAL_CAPSULE | ORAL | 3 refills | Status: DC

## 2022-03-14 NOTE — Progress Notes (Signed)
Office Visit Note   Patient: Tracy Mercado           Date of Birth: [birth year]           MRN: [id number] Visit Date: 03/14/2022              Requested by: No referring provider defined for this encounter. PCP: Patient, No Pcp Per   Assessment & Plan: Visit Diagnoses:  1. Pain in right leg   2. History of total right knee replacement     Plan: Will refill her tenacity in and gabapentin.  Will place her on Medrol Dosepak she is to take no NSAIDs while on the Medrol Dosepak this is discussed with her at length.  Also send her to formal physical therapy here at our office for her lumbar spine to work on core strengthening, back exercises, stretching, home exercise program and include modalities.  Will see her back here in approximately 5 weeks to see how she is doing overall.  Questions were encouraged and answered at length.  Follow-Up Instructions: Return in about 5 weeks (around 04/18/2022).   Orders:  Orders Placed This Encounter  Procedures   XR Knee 1-2 Views Right   XR Lumbar Spine 2-3 Views   Ambulatory referral to Physical Therapy   Meds ordered this encounter  Medications   tiZANidine (ZANAFLEX) 4 MG tablet    Sig: TAKE 1 TABLET(4 MG) BY MOUTH EVERY 8 HOURS AS NEEDED FOR MUSCLE SPASMS    Dispense:  60 tablet    Refill:  1   gabapentin (NEURONTIN) 300 MG capsule    Sig: TAKE 1 CAPSULE(300 MG) BY MOUTH AT BEDTIME AS NEEDED    Dispense:  30 capsule    Refill:  3   methylPREDNISolone (MEDROL) 4 MG tablet    Sig: Take as directed    Dispense:  21 tablet    Refill:  0      Procedures: No procedures performed   Clinical Data: No additional findings.   Subjective: Chief Complaint  Patient presents with   Right Knee - Routine Post Op    HPI Tracy Mercado returns today for follow-up status post right total knee arthroplasty 04/05/2021.  States her knee is overall doing well.  Her main complaint is she is having hot pins-and-needles from the knee down to the  ankle region.  It awakens her at night.  She does note some back pain.  Pain is worse after standing or walking for prolonged period of time.  She has been taking temazepam and gabapentin which help with pain and allow her some sleep.  She has had no known injury.  She denies any bowel or bladder dysfunction.  No saddle anesthesia like symptoms.  Negative for fevers chills.  Negative for weight loss. Review of Systems See HPI otherwise negative  Objective: Vital Signs: There were no vitals taken for this visit.  Physical Exam General: Well-developed well-nourished female no acute distress.  Psych alert and oriented x 3  Ortho Exam Right knee: Good range of motion of the right knee.  No abnormal warmth erythema or effusion.  No instability valgus varus stressing.  Surgical incisions well-healed calf supple nontender. Lower extremities she has 5 out of 5 strength throughout the lower extremities against resistance.  Negative straight leg raise bilaterally.  Tenderness right lower lumbar paraspinous region.  Tight hamstrings.  She comes within 3 to 4 inches of being out touch toes.  She has limited extension lumbar spine.  Sensation grossly intact bilateral feet dorsal pedal pulses are 2+. Specialty Comments:  No specialty comments available.  Imaging: XR Knee 1-2 Views Right  Result Date: 03/14/2022 Right knee 2 views: Status post right total knee arthroplasty well-seated components.  No acute fracture.  Knee is well located.  XR Lumbar Spine 2-3 Views  Result Date: 03/14/2022 Lumbar spine 2 views: No acute fractures.  No spinal listhesis.  Disc space overall well-maintained except for L5-S1 which is narrowed.  No acute findings.  Bilateral hips appear well located.    PMFS History: Patient Active Problem List   Diagnosis Date Noted   Status post right knee replacement 04/05/2021   Unilateral primary osteoarthritis, right knee 01/10/2021   Past Medical History:  Diagnosis Date    Arthritis    CAD (coronary artery disease)    Hypertension     Family History  Problem Relation Age of Onset   Stroke Mother    Healthy Father     Past Surgical History:  Procedure Laterality Date   HAND SURGERY Left    KNEE ARTHROSCOPY     TOTAL KNEE ARTHROPLASTY Right 04/05/2021   Procedure: Right TOTAL KNEE ARTHROPLASTY;  Surgeon: Mcarthur Rossetti, MD;  Location: [facility];  Service: Orthopedics;  Laterality: Right;   Social History   Occupational History   Not on file  Tobacco Use   Smoking status: Every Day    Packs/day: 0.50    Types: Cigarettes   Smokeless tobacco: Never  Vaping Use   Vaping Use: Never used  Substance and Sexual Activity   Alcohol use: Yes   Drug use: No   Sexual activity: Not Currently

## 2022-04-03 NOTE — Therapy (Signed)
OUTPATIENT PHYSICAL THERAPY THORACOLUMBAR EVALUATION   Patient Name: Tracy Mercado MRN: [id number] DOB:[birth year], 62 y.o., female Today's Date: 04/04/2022  END OF SESSION:  PT End of Session - 04/04/22 0851     Visit Number 1    Number of Visits 9    Date for PT Re-Evaluation 05/30/22    Authorization Type [place] [employer]    Authorization Time Period no auth, 27 VL    PT Start Time [phone]   late check in   PT Stop Time 0924    PT Time Calculation (min) 32 min    Activity Tolerance Patient tolerated treatment well    Behavior During Therapy WFL for tasks assessed/performed             Past Medical History:  Diagnosis Date   Arthritis    CAD (coronary artery disease)    Hypertension    Past Surgical History:  Procedure Laterality Date   HAND SURGERY Left    KNEE ARTHROSCOPY     TOTAL KNEE ARTHROPLASTY Right 04/05/2021   Procedure: Right TOTAL KNEE ARTHROPLASTY;  Surgeon: Mcarthur Rossetti, MD;  Location: [facility];  Service: Orthopedics;  Laterality: Right;   Patient Active Problem List   Diagnosis Date Noted   Status post right knee replacement 04/05/2021   Unilateral primary osteoarthritis, right knee 01/10/2021    PCP: no PCP in chart  REFERRING PROVIDER: Pete Pelt, PA-C  REFERRING DIAG: [phone] (ICD-10-CM) - Pain in right leg  note: "LBP - work on strengthening, stretching, HEP, and modalities"  Rationale for Evaluation and Treatment: Rehabilitation  THERAPY DIAG:  Other low back pain  Right knee pain, unspecified chronicity  Other abnormalities of gait and mobility  ONSET DATE: after returning to work   SUBJECTIVE:                                                                                                                                                                                           SUBJECTIVE STATEMENT: Had a knee replacement last year and states she did well, got PT and went back to work within a couple months. States her  knee/back pain may have been because she went back to work too quickly, has a very active job.  Symptoms have stayed about the same since onset, worsens with time up on her feet and with lifting.   Occasional burning in anterior knee, not in dermatomal distribution. Red flag questioning assuring.   PERTINENT HISTORY:  Hx R TKA, CAD, HTN  PAIN:  Are you having pain: "not really" Location/description: low back (along belt line) and R knee, some distal referral  but not all the way into foot Best-worst over past week: 0-8/10  Per eval -  - aggravating factors: vacuuming, walking/standing >2 hrs, sitting for a while, lying on back, lifting heavy items at work  - Easing factors: rest    PRECAUTIONS: None  WEIGHT BEARING RESTRICTIONS: No  FALLS:  Has patient fallen in last 6 months? Yes. Number of falls 1, last week. Luggage got caught on car door. Denies balance issues.   LIVING ENVIRONMENT: Apartment, [address]STE B rail. Lives w/ wife who works part time. Housework split between the two of them.   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 , lots of lifting   PLOF: Independent  PATIENT GOALS: get ready for retirement, wants to stay active   NEXT MD VISIT: end of April  OBJECTIVE:   DIAGNOSTIC FINDINGS:  XR lumbar spine and R knee 03/14/22 impressions reassuring (see epic for details)  PATIENT SURVEYS:  Oswestry: 8/50, 16%   SCREENING FOR RED FLAGS: Red flag questioning/screening reassuring    COGNITION: Overall cognitive status: Within functional limits for tasks assessed     SENSATION: Light touch intact all dermatomes, mild light touch diminished lateral R knee from surgery    POSTURE: guarded UT BIL, increased FHP and kyphosis  PALPATION: Deferred given time constraints  LUMBAR ROM:   AROM eval  Flexion 100% *  Extension 100* relieving   Right lateral flexion   Left lateral flexion   Right rotation 100%  Left rotation 25% *    (Blank rows = not tested) (Key: WFL = within functional limits not formally assessed, * = concordant pain, s = stiffness/stretching sensation, NT = not tested)  Comments: repeated extensions with improved symptoms but does not subsequently change flexion tolerance  LOWER EXTREMITY ROM:     Active  Right eval Left eval  Hip flexion    Hip extension    Hip internal rotation    Hip external rotation    Knee extension    Knee flexion    (Blank rows = not tested) (Key: WFL = within functional limits not formally assessed, * = concordant pain, s = stiffness/stretching sensation, NT = not tested)  Comments:    LOWER EXTREMITY MMT:    MMT Right eval Left eval  Hip flexion 4 knee pain 5  Hip abduction (modified sitting) 5 54  Hip internal rotation    Hip external rotation    Knee flexion 4 hamstring cramp 5  Knee extension 5 5  Ankle dorsiflexion 5 5   (Blank rows = not tested) (Key: WFL = within functional limits not formally assessed, * = concordant pain, s = stiffness/stretching sensation, NT = not tested)  Comments:    FUNCTIONAL TESTS:  5 times sit to stand: 9.79 sec no UE support, no increase in pain  GAIT: Distance walked: within clinic Assistive device utilized: None Level of assistance: Complete Independence Comments: gait mechanics grossly WNL  TODAY'S TREATMENT:  [facility] Adult PT Treatment:                                                DATE: 04/04/22 Therapeutic Exercise: Seated thoracolumbar extension x10 cues for comfortable ROM Seated lumbar rotation x10 cues for comfortable ROM HEP + handout, education   PATIENT EDUCATION:  Education details: Pt education on PT impairments, prognosis, and POC. Informed consent. Rationale for interventions, safe/appropriate HEP performance Person educated: Patient Education method: Explanation,  Demonstration, Tactile cues, Verbal cues, and Handouts Education comprehension: verbalized understanding, returned demonstration, verbal cues required, tactile cues required, and needs further education    HOME EXERCISE PROGRAM: Access Code: [email or web address] URL: [email or web address] Date: 04/04/2022 Prepared by: Enis Slipper  Exercises - Seated Trunk Rotation - Arms Crossed  - 1 x daily - 7 x weekly - 2 sets - 10 reps - Seated Thoracic Lumbar Extension  - 1 x daily - 7 x weekly - 2 sets - 10 reps  ASSESSMENT:  CLINICAL IMPRESSION: Patient is a pleasant 62 y.o. woman who was seen today for physical therapy evaluation and treatment for low back pain/RLE pain. Pt reports ongoing back + R knee pain since return to work after R TKA last year, tends to worsen as day goes on. On exam today pt demos limitations in lumbar mobility with extension preference, mild RLE weakness compared to L. Tolerates HEP well, no adverse events. Recommend skilled PT in order to address relevant deficits and improve functional tolerance. Pt departs today's session in no acute distress, all voiced questions/concerns addressed appropriately from PT perspective.    OBJECTIVE IMPAIRMENTS: decreased activity tolerance, decreased endurance, decreased mobility, difficulty walking, decreased ROM, decreased strength, and pain.   ACTIVITY LIMITATIONS: carrying, lifting, bending, standing, squatting, stairs, transfers, and locomotion level  PARTICIPATION LIMITATIONS: cleaning, laundry, community activity, and occupation  PERSONAL FACTORS: Time since onset of injury/illness/exacerbation and 1-2 comorbidities: CAD, HTN  are also affecting patient's functional outcome.   REHAB POTENTIAL: Good  CLINICAL DECISION MAKING: Stable/uncomplicated  EVALUATION COMPLEXITY: Low   GOALS: Goals reviewed with patient? No  SHORT TERM GOALS: Target date: 05/02/2022   Pt will demonstrate appropriate understanding and performance  of initially prescribed HEP in order to facilitate improved independence with management of symptoms.  Baseline: HEP provided on eval Goal status: INITIAL    LONG TERM GOALS: Target date: 05/30/2022    Pt will score less than or equal to 8% on Modified Oswestry in order to demonstrate improved perception of functional status due to symptoms.  Baseline: 16% Goal status: INITIAL  2.  Pt will demonstrate symmetrical and painless lumbar rotation AROM in order to demonstrate improved tolerance to functional movement patterns.  Baseline: see ROM chart above Goal status: INITIAL  3.  Pt will demonstrate grossly symmetrical LE MMT globally in order to demonstrate improved strength for functional movements.  Baseline: see MMT chart above Goal status: INITIAL  4. Pt will be able to lift up to 25# with less than 2 pt increase in pain on NPS for 5 repetitions in order to improve tolerance to work tasks.   Baseline: pain with lifting at work  Goal status: INITIAL   PLAN:  PT FREQUENCY: 1x/week  PT DURATION: 8 weeks  PLANNED INTERVENTIONS: Therapeutic exercises, Therapeutic activity, Neuromuscular re-education, Balance training, Gait training, Patient/Family education, Self Care, Joint mobilization, Stair training, DME instructions, Aquatic  Therapy, Dry Needling, Electrical stimulation, Spinal mobilization, Cryotherapy, Moist heat, Taping, Manual therapy, and Re-evaluation.  PLAN FOR NEXT SESSION: Progress ROM/strengthening exercises as able/appropriate, review HEP.    Leeroy Cha PT, DPT 04/04/2022 1:50 PM

## 2022-04-04 ENCOUNTER — Encounter: Payer: Self-pay | Admitting: Physical Therapy

## 2022-04-04 ENCOUNTER — Ambulatory Visit: Payer: Medicaid Other | Attending: Physician Assistant | Admitting: Physical Therapy

## 2022-04-04 ENCOUNTER — Other Ambulatory Visit: Payer: Self-pay

## 2022-04-04 DIAGNOSIS — M6281 Muscle weakness (generalized): Secondary | ICD-10-CM | POA: Diagnosis present

## 2022-04-04 DIAGNOSIS — M79604 Pain in right leg: Secondary | ICD-10-CM | POA: Insufficient documentation

## 2022-04-04 DIAGNOSIS — M25661 Stiffness of right knee, not elsewhere classified: Secondary | ICD-10-CM | POA: Diagnosis present

## 2022-04-04 DIAGNOSIS — R2689 Other abnormalities of gait and mobility: Secondary | ICD-10-CM | POA: Diagnosis present

## 2022-04-04 DIAGNOSIS — M5459 Other low back pain: Secondary | ICD-10-CM | POA: Diagnosis present

## 2022-04-04 DIAGNOSIS — R600 Localized edema: Secondary | ICD-10-CM | POA: Diagnosis present

## 2022-04-04 DIAGNOSIS — M25561 Pain in right knee: Secondary | ICD-10-CM | POA: Diagnosis present

## 2022-04-18 ENCOUNTER — Encounter: Payer: Self-pay | Admitting: Physical Therapy

## 2022-04-18 ENCOUNTER — Ambulatory Visit: Payer: Medicaid Other | Admitting: Physical Therapy

## 2022-04-18 DIAGNOSIS — M5459 Other low back pain: Secondary | ICD-10-CM | POA: Diagnosis not present

## 2022-04-18 DIAGNOSIS — M25561 Pain in right knee: Secondary | ICD-10-CM

## 2022-04-18 NOTE — Therapy (Signed)
OUTPATIENT PHYSICAL THERAPY TREATMENT NOTE   Patient Name: Tracy Mercado MRN: [id number] DOB:09/05/1960, 62 y.o., 62 y.o., female Today's Date: 04/18/2022  PCP: no PCP in chart   REFERRING PROVIDER: Kirtland Bouchard, PA-C  END OF SESSION:   PT End of Session - 04/18/22 0805     Visit Number 2    Number of Visits 9    Date for PT Re-Evaluation 05/30/22    Authorization Type [place] [employer]    Authorization Time Period no auth, 27 VL    PT Start Time 0800    PT Stop Time 0845    PT Time Calculation (min) 45 min             Past Medical History:  Diagnosis Date   Arthritis    CAD (coronary artery disease)    Hypertension    Past Surgical History:  Procedure Laterality Date   HAND SURGERY Left    KNEE ARTHROSCOPY     TOTAL KNEE ARTHROPLASTY Right 04/05/2021   Procedure: Right TOTAL KNEE ARTHROPLASTY;  Surgeon: Kathryne Hitch, MD;  Location: [facility];  Service: Orthopedics;  Laterality: Right;   Patient Active Problem List   Diagnosis Date Noted   Status post right knee replacement 04/05/2021   Unilateral primary osteoarthritis, right knee 01/10/2021    REFERRING DIAG: M79.604 (ICD-10-CM) - Pain in right leg  note: "LBP - work on strengthening, stretching, HEP, and modalities"  THERAPY DIAG:  Other low back pain  Right knee pain, unspecified chronicity  Rationale for Evaluation and Treatment rehabilitation  PERTINENT HISTORY:  Hx R TKA, CAD, HTN  PRECAUTIONS: none  SUBJECTIVE:                                                                                                                                                                                     /.  SUBJECTIVE STATEMENT:  Pt reports exercises are okay, rates back and knee pain at    PAIN:  Are you having pain:8/10 Location/description: low back (along belt line) and R knee, some distal referral but not all the way into foot Best-worst over past week: 0-8/10  Per eval -  - aggravating factors:  vacuuming, walking/standing >2 hrs, sitting for a while, lying on back, lifting heavy items at work  - Easing factors: rest     OBJECTIVE: (objective measures completed at initial evaluation unless otherwise dated)   DIAGNOSTIC FINDINGS:  XR lumbar spine and R knee 03/14/22 impressions reassuring (see epic for details)   PATIENT SURVEYS:  Oswestry: 8/50, 16%    SCREENING FOR RED FLAGS: Red flag questioning/screening reassuring     COGNITION: Overall cognitive status: Within functional  limits for tasks assessed                          SENSATION: Light touch intact all dermatomes, mild light touch diminished lateral R knee from surgery      POSTURE: guarded UT BIL, increased FHP and kyphosis   PALPATION: Deferred given time constraints   LUMBAR ROM:    AROM eval  Flexion 100% *  Extension 100* relieving   Right lateral flexion    Left lateral flexion    Right rotation 100%  Left rotation 25% *   (Blank rows = not tested) (Key: WFL = within functional limits not formally assessed, * = concordant pain, s = stiffness/stretching sensation, NT = not tested)  Comments: repeated extensions with improved symptoms but does not subsequently change flexion tolerance   LOWER EXTREMITY ROM:      Active  Right eval Left eval Right 04/18/22  Hip flexion       Hip extension       Hip internal rotation       Hip external rotation       Knee extension       Knee flexion     115  (Blank rows = not tested) (Key: WFL = within functional limits not formally assessed, * = concordant pain, s = stiffness/stretching sensation, NT = not tested)  Comments:     LOWER EXTREMITY MMT:     MMT Right eval Left eval  Hip flexion 4 knee pain 5  Hip abduction (modified sitting) 5 54  Hip internal rotation      Hip external rotation      Knee flexion 4 hamstring cramp 5  Knee extension 5 5  Ankle dorsiflexion 5 5    (Blank rows = not tested) (Key: WFL = within functional limits not  formally assessed, * = concordant pain, s = stiffness/stretching sensation, NT = not tested)  Comments:     FUNCTIONAL TESTS:  5 times sit to stand: 9.79 sec no UE support, no increase in pain   GAIT: Distance walked: within clinic Assistive device utilized: None Level of assistance: Complete Independence Comments: gait mechanics grossly WNL   TODAY'S TREATMENT:                                                                                                                              [facility] Adult PT Treatment:                                                DATE: 04/18/22 Therapeutic Exercise: Nustep L5 UE/LE x 5 minutes  Seated trunk rotation  Seated trunk extension  Supine LTR  Bridge x 10 Supine hamstring stretch with strap  Prone lying, to press ups 5 sec 10 x 2  Prone hip ext alternating  Side clam Piriformis stretch   Modalities: HMP x 5 minutes for education     [facility] Adult PT Treatment:                                                DATE: 04/04/22 Therapeutic Exercise: Seated thoracolumbar extension x10 cues for comfortable ROM Seated lumbar rotation x10 cues for comfortable ROM HEP + handout, education     PATIENT EDUCATION:  Education details: Pt education on PT impairments, prognosis, and POC. Informed consent. Rationale for interventions, safe/appropriate HEP performance Person educated: Patient Education method: Explanation, Demonstration, Tactile cues, Verbal cues, and Handouts Education comprehension: verbalized understanding, returned demonstration, verbal cues required, tactile cues required, and needs further education     HOME EXERCISE PROGRAM: Access Code: [email or web address] URL: [email or web address] Date: 04/04/2022 Prepared by: Fransisco Hertz   Exercises - Seated Trunk Rotation - Arms Crossed  - 1 x daily - 7 x weekly - 2 sets - 10 reps - Seated Thoracic Lumbar Extension  - 1 x daily - 7 x weekly - 2 sets - 10 reps 04/18/22 - Supine Bridge  - 1  x daily - 7 x weekly - 2-3 sets - 10 reps - Beginner Clam  - 1 x daily - 7 x weekly - 2-3 sets - 10 reps - Supine Piriformis Stretch with Leg Straight  - 1 x daily - 7 x weekly - 1 sets - 3 reps - 30 hold   ASSESSMENT:   CLINICAL IMPRESSION: Patient is a pleasant 62 y.o. woman who was seen today for physical therapy treatment for low back pain/RLE pain. She reports compliance with HEP and continued pain. Reviewed HEP and progressed with Hip strength and hip flexibility. Her HEP was updated. Trial of HMP as pt does not use ice or heat for pain management at this time. At end of session she reported feeling "Pretty good."    EVAL: Pt reports ongoing back + R knee pain since return to work after R TKA last year, tends to worsen as day goes on. On exam today pt demos limitations in lumbar mobility with extension preference, mild RLE weakness compared to L.   OBJECTIVE IMPAIRMENTS: decreased activity tolerance, decreased endurance, decreased mobility, difficulty walking, decreased ROM, decreased strength, and pain.    ACTIVITY LIMITATIONS: carrying, lifting, bending, standing, squatting, stairs, transfers, and locomotion level   PARTICIPATION LIMITATIONS: cleaning, laundry, community activity, and occupation   PERSONAL FACTORS: Time since onset of injury/illness/exacerbation and 1-2 comorbidities: CAD, HTN  are also affecting patient's functional outcome.    REHAB POTENTIAL: Good   CLINICAL DECISION MAKING: Stable/uncomplicated   EVALUATION COMPLEXITY: Low     GOALS: Goals reviewed with patient? No   SHORT TERM GOALS: Target date: 05/02/2022   Pt will demonstrate appropriate understanding and performance of initially prescribed HEP in order to facilitate improved independence with management of symptoms.  Baseline: HEP provided on eval Goal status: INITIAL      LONG TERM GOALS: Target date: 05/30/2022     Pt will score less than or equal to 8% on Modified Oswestry in order to  demonstrate improved perception of functional status due to symptoms.  Baseline: 16% Goal status: INITIAL   2.  Pt will demonstrate symmetrical and painless lumbar rotation AROM in order to demonstrate improved tolerance to functional movement patterns.  Baseline: see ROM chart above Goal status: INITIAL   3.  Pt will demonstrate grossly symmetrical LE MMT globally in order to demonstrate improved strength for functional movements.  Baseline: see MMT chart above Goal status: INITIAL   4. Pt will be able to lift up to 25# with less than 2 pt increase in pain on NPS for 5 repetitions in order to improve tolerance to work tasks.             Baseline: pain with lifting at work            Goal status: INITIAL    PLAN:   PT FREQUENCY: 1x/week   PT DURATION: 8 weeks   PLANNED INTERVENTIONS: Therapeutic exercises, Therapeutic activity, Neuromuscular re-education, Balance training, Gait training, Patient/Family education, Self Care, Joint mobilization, Stair training, DME instructions, Aquatic Therapy, Dry Needling, Electrical stimulation, Spinal mobilization, Cryotherapy, Moist heat, Taping, Manual therapy, and Re-evaluation.   PLAN FOR NEXT SESSION: Progress ROM/strengthening exercises as able/appropriate, review HEP.    Jannette Spanner, PTA 04/18/22 10:38 AM Phone: [phone] Fax: [phone]

## 2022-04-25 ENCOUNTER — Encounter: Payer: Self-pay | Admitting: Physical Therapy

## 2022-04-25 ENCOUNTER — Ambulatory Visit: Payer: Medicaid Other | Admitting: Physical Therapy

## 2022-04-25 DIAGNOSIS — M5459 Other low back pain: Secondary | ICD-10-CM

## 2022-04-25 NOTE — Patient Instructions (Signed)

## 2022-04-25 NOTE — Therapy (Signed)
OUTPATIENT PHYSICAL THERAPY TREATMENT NOTE   Patient Name: Tracy Mercado MRN: [id number] DOB:1960/06/11, 62 y.o., female Today's Date: 04/25/2022  PCP: no PCP in chart   REFERRING PROVIDER: Kirtland Bouchard, PA-C  END OF SESSION:   PT End of Session - 04/25/22 0720     Visit Number 3    Number of Visits 9    Date for PT Re-Evaluation 05/30/22    Authorization Type [place] [employer]    Authorization Time Period no auth, 27 VL    PT Start Time 0718    PT Stop Time 0803    PT Time Calculation (min) 45 min             Past Medical History:  Diagnosis Date   Arthritis    CAD (coronary artery disease)    Hypertension    Past Surgical History:  Procedure Laterality Date   HAND SURGERY Left    KNEE ARTHROSCOPY     TOTAL KNEE ARTHROPLASTY Right 04/05/2021   Procedure: Right TOTAL KNEE ARTHROPLASTY;  Surgeon: Kathryne Hitch, MD;  Location: [facility] OR;  Service: Orthopedics;  Laterality: Right;   Patient Active Problem List   Diagnosis Date Noted   Status post right knee replacement 04/05/2021   Unilateral primary osteoarthritis, right knee 01/10/2021    REFERRING DIAG: M79.604 (ICD-10-CM) - Pain in right leg  note: "LBP - work on strengthening, stretching, HEP, and modalities"  THERAPY DIAG:  Other low back pain  Rationale for Evaluation and Treatment rehabilitation  PERTINENT HISTORY:  Hx R TKA, CAD, HTN  PRECAUTIONS: none  SUBJECTIVE:                                                                                                                                                                                      SUBJECTIVE STATEMENT:  I do most of the exercises at work. The back is a 10/10. 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    PAIN:  Are you having pain:10/10 Location/description: low back (along belt line) and R knee, some distal referral but not all the way into foot Best-worst over past week: 0-8/10  Per  eval -  - aggravating factors: vacuuming, walking/standing >2 hrs, sitting for a while, lying on back, lifting heavy items at work  - Easing factors: rest     OBJECTIVE: (objective measures completed at initial evaluation unless otherwise dated)   DIAGNOSTIC FINDINGS:  XR lumbar spine and R knee 03/14/22 impressions reassuring (see epic for details)   PATIENT SURVEYS:  Oswestry: 8/50, 16%    SCREENING FOR RED FLAGS: Red flag  questioning/screening reassuring     COGNITION: Overall cognitive status: Within functional limits for tasks assessed                          SENSATION: Light touch intact all dermatomes, mild light touch diminished lateral R knee from surgery      POSTURE: guarded UT BIL, increased FHP and kyphosis   PALPATION: Deferred given time constraints   LUMBAR ROM:    AROM eval 04/25/22  Flexion 100% *   Extension 100* relieving    Right lateral flexion     Left lateral flexion     Right rotation 100% pain  Left rotation 25% * 50%   (Blank rows = not tested) (Key: WFL = within functional limits not formally assessed, * = concordant pain, s = stiffness/stretching sensation, NT = not tested)  Comments: repeated extensions with improved symptoms but does not subsequently change flexion tolerance   LOWER EXTREMITY ROM:      Active  Right eval Left eval Right 04/18/22  Hip flexion       Hip extension       Hip internal rotation       Hip external rotation       Knee extension       Knee flexion     115  (Blank rows = not tested) (Key: WFL = within functional limits not formally assessed, * = concordant pain, s = stiffness/stretching sensation, NT = not tested)  Comments:     LOWER EXTREMITY MMT:     MMT Right eval Left eval  Hip flexion 4 knee pain 5  Hip abduction (modified sitting) 5 54  Hip internal rotation      Hip external rotation      Knee flexion 4 hamstring cramp 5  Knee extension 5 5  Ankle dorsiflexion 5 5    (Blank rows = not  tested) (Key: WFL = within functional limits not formally assessed, * = concordant pain, s = stiffness/stretching sensation, NT = not tested)  Comments:     FUNCTIONAL TESTS:  5 times sit to stand: 9.79 sec no UE support, no increase in pain   GAIT: Distance walked: within clinic Assistive device utilized: None Level of assistance: Complete Independence Comments: gait mechanics grossly WNL   TODAY'S TREATMENT:                                                                                                                              [facility] Adult PT Treatment:                                                DATE: 04/25/22 Therapeutic Exercise: Nustep L5 UE/LE x 5 minutes  Open books standing at wall x 5 each  L stretch  at sink Counter top- leg extensions- cues to avoid compensations- added UE briefly Prone lying to press up x 10 Clam shell SL x 12 each  Bridge Piriformis stretch  Modalities: IFC with HMP x 15 minutes prone, Lumbar     [facility] Adult PT Treatment:                                                DATE: 04/18/22 Therapeutic Exercise: Nustep L5 UE/LE x 5 minutes  Seated trunk rotation  Seated trunk extension  Supine LTR  Bridge x 10 Supine hamstring stretch with strap  Prone lying, to press ups 5 sec 10 x 2  Prone hip ext alternating  Side clam Piriformis stretch   Modalities: HMP x 5 minutes for education     [facility] Adult PT Treatment:                                                DATE: 04/04/22 Therapeutic Exercise: Seated thoracolumbar extension x10 cues for comfortable ROM Seated lumbar rotation x10 cues for comfortable ROM HEP + handout, education     PATIENT EDUCATION:  Education details: Pt education on PT impairments, prognosis, and POC. Informed consent. Rationale for interventions, safe/appropriate HEP performance Person educated: Patient Education method: Explanation, Demonstration, Tactile cues, Verbal cues, and Handouts Education comprehension:  verbalized understanding, returned demonstration, verbal cues required, tactile cues required, and needs further education     HOME EXERCISE PROGRAM: Access Code: [email or web address] URL: [email or web address] Date: 04/04/2022 Prepared by: Fransisco Hertz   Exercises - Seated Trunk Rotation - Arms Crossed  - 1 x daily - 7 x weekly - 2 sets - 10 reps - Seated Thoracic Lumbar Extension  - 1 x daily - 7 x weekly - 2 sets - 10 reps 04/18/22 - Supine Bridge  - 1 x daily - 7 x weekly - 2-3 sets - 10 reps - Beginner Clam  - 1 x daily - 7 x weekly - 2-3 sets - 10 reps - Supine Piriformis Stretch with Leg Straight  - 1 x daily - 7 x weekly - 1 sets - 3 reps - 30 hold   ASSESSMENT:   CLINICAL IMPRESSION: Patient is a pleasant 62 y.o. woman who was seen today for physical therapy treatment for low back pain/RLE pain. She reports compliance with HEP and continued pain, rating it 10/10 with heavier job duties at work this past week. Continued with extension bias lumbar stab and trunk flexibility. Her AROM lumbar rotation is improved although still painful. Trial of Estim today due to high level of pain. No updates to HEP this visit. At end of session she reported minimally reduced pain and was given information on home TENS unit.    EVAL: Pt reports ongoing back + R knee pain since return to work after R TKA last year, tends to worsen as day goes on. On exam today pt demos limitations in lumbar mobility with extension preference, mild RLE weakness compared to L.   OBJECTIVE IMPAIRMENTS: decreased activity tolerance, decreased endurance, decreased mobility, difficulty walking, decreased ROM, decreased strength, and pain.    ACTIVITY LIMITATIONS: carrying, lifting, bending, standing, squatting, stairs, transfers, and locomotion level   PARTICIPATION LIMITATIONS: cleaning, laundry, community activity,  and occupation   PERSONAL FACTORS: Time since onset of injury/illness/exacerbation and 1-2  comorbidities: CAD, HTN  are also affecting patient's functional outcome.    REHAB POTENTIAL: Good   CLINICAL DECISION MAKING: Stable/uncomplicated   EVALUATION COMPLEXITY: Low     GOALS: Goals reviewed with patient? No   SHORT TERM GOALS: Target date: 05/02/2022   Pt will demonstrate appropriate understanding and performance of initially prescribed HEP in order to facilitate improved independence with management of symptoms.  Baseline: HEP provided on eval Goal status: INITIAL      LONG TERM GOALS: Target date: 05/30/2022     Pt will score less than or equal to 8% on Modified Oswestry in order to demonstrate improved perception of functional status due to symptoms.  Baseline: 16% Goal status: INITIAL   2.  Pt will demonstrate symmetrical and painless lumbar rotation AROM in order to demonstrate improved tolerance to functional movement patterns.  Baseline: see ROM chart above Goal status: INITIAL   3.  Pt will demonstrate grossly symmetrical LE MMT globally in order to demonstrate improved strength for functional movements.  Baseline: see MMT chart above Goal status: INITIAL   4. Pt will be able to lift up to 25# with less than 2 pt increase in pain on NPS for 5 repetitions in order to improve tolerance to work tasks.             Baseline: pain with lifting at work            Goal status: INITIAL    PLAN:   PT FREQUENCY: 1x/week   PT DURATION: 8 weeks   PLANNED INTERVENTIONS: Therapeutic exercises, Therapeutic activity, Neuromuscular re-education, Balance training, Gait training, Patient/Family education, Self Care, Joint mobilization, Stair training, DME instructions, Aquatic Therapy, Dry Needling, Electrical stimulation, Spinal mobilization, Cryotherapy, Moist heat, Taping, Manual therapy, and Re-evaluation.   PLAN FOR NEXT SESSION: Progress ROM/strengthening exercises as able/appropriate, review HEP.  Start hip hinge education assess response to TENS, DRY  needle?   Jannette Spanner, PTA 04/25/22 10:08 AM Phone: [phone] Fax: [phone]

## 2022-04-27 NOTE — Therapy (Signed)
OUTPATIENT PHYSICAL THERAPY TREATMENT NOTE   Patient Name: Tracy Mercado MRN: [id number] DOB:02-Jul-1960, 62 y.o., female Today's Date: 05/02/2022  PCP: no PCP in chart   REFERRING PROVIDER: Kirtland Bouchard, PA-C  END OF SESSION:   PT End of Session - 05/02/22 0757     Visit Number 4    Number of Visits 9    Date for PT Re-Evaluation 05/30/22    Authorization Type [place] [employer]    Authorization Time Period no auth, 27 VL    PT Start Time 0800    PT Stop Time 0845    PT Time Calculation (min) 45 min    Activity Tolerance Patient tolerated treatment well    Behavior During Therapy WFL for tasks assessed/performed              Past Medical History:  Diagnosis Date   Arthritis    CAD (coronary artery disease)    Hypertension    Past Surgical History:  Procedure Laterality Date   HAND SURGERY Left    KNEE ARTHROSCOPY     TOTAL KNEE ARTHROPLASTY Right 04/05/2021   Procedure: Right TOTAL KNEE ARTHROPLASTY;  Surgeon: Kathryne Hitch, MD;  Location: [facility] OR;  Service: Orthopedics;  Laterality: Right;   Patient Active Problem List   Diagnosis Date Noted   Status post right knee replacement 04/05/2021   Unilateral primary osteoarthritis, right knee 01/10/2021    REFERRING DIAG: M79.604 (ICD-10-CM) - Pain in right leg  note: "LBP - work on strengthening, stretching, HEP, and modalities"  THERAPY DIAG:  Muscle weakness (generalized)  Other low back pain  Right knee pain, unspecified chronicity  Other abnormalities of gait and mobility  Acute pain of right knee  Stiffness of right knee, not elsewhere classified  Localized edema  Rationale for Evaluation and Treatment rehabilitation  PERTINENT HISTORY:  Hx R TKA, CAD, HTN  PRECAUTIONS: none  SUBJECTIVE:                                                                                                                                                                                      SUBJECTIVE  STATEMENT:  I do most of the exercises at work at [employer] The back is a 9/10. My R leg is no pain.  I had injection in my knee yesterday by Richardean Canal. MD   PAIN:  Are you having pain:10/10 Location/description: low back (along belt line) and R knee, some distal referral but not all the way into foot Best-worst over past week: 0-8/10  Per eval -  - aggravating factors: vacuuming, walking/standing >2 hrs, sitting for a while, lying on back, lifting heavy  items at work  - Easing factors: rest     OBJECTIVE: (objective measures completed at initial evaluation unless otherwise dated)   DIAGNOSTIC FINDINGS:  XR lumbar spine and R knee 03/14/22 impressions reassuring (see epic for details)   PATIENT SURVEYS:  Oswestry: 8/50, 16%    SCREENING FOR RED FLAGS: Red flag questioning/screening reassuring     COGNITION: Overall cognitive status: Within functional limits for tasks assessed                          SENSATION: Light touch intact all dermatomes, mild light touch diminished lateral R knee from surgery      POSTURE: guarded UT BIL, increased FHP and kyphosis   PALPATION: Deferred given time constraints   LUMBAR ROM:    AROM eval 04/25/22  Flexion 100% *   Extension 100* relieving    Right lateral flexion     Left lateral flexion     Right rotation 100% pain  Left rotation 25% * 50%   (Blank rows = not tested) (Key: WFL = within functional limits not formally assessed, * = concordant pain, s = stiffness/stretching sensation, NT = not tested)  Comments: repeated extensions with improved symptoms but does not subsequently change flexion tolerance   LOWER EXTREMITY ROM:      Active  Right eval Left eval Right 04/18/22  Hip flexion       Hip extension       Hip internal rotation       Hip external rotation       Knee extension       Knee flexion     115  (Blank rows = not tested) (Key: WFL = within functional limits not formally assessed, * = concordant  pain, s = stiffness/stretching sensation, NT = not tested)  Comments:     LOWER EXTREMITY MMT:     MMT Right eval Left eval  Hip flexion 4 knee pain 5  Hip abduction (modified sitting) 5 54  Hip internal rotation      Hip external rotation      Knee flexion 4 hamstring cramp 5  Knee extension 5 5  Ankle dorsiflexion 5 5    (Blank rows = not tested) (Key: WFL = within functional limits not formally assessed, * = concordant pain, s = stiffness/stretching sensation, NT = not tested)  Comments:     FUNCTIONAL TESTS:  5 times sit to stand: 9.79 sec no UE support, no increase in pain   GAIT: Distance walked: within clinic Assistive device utilized: None Level of assistance: Complete Independence Comments: gait mechanics grossly WNL   TODAY'S TREATMENT:   [facility] Adult PT Treatment:                                                DATE: 05-02-22 Therapeutic Exercise: Open book in sidelying on R and L x 10 Childs pose forward, and side to side 2 rounds 15 seconds each Clamshell with GTB 3 x 10 R and L Bridge Piriformis stretch Prone lying, to press ups 5 sec 10 x 2  Prone hip ext alternating  Pelvic press  Manual Therapy: STW/Myofasical release to bil Quadratus Lumborum Trigger Point Dry Needling Treatment: Pre-treatment instruction: Patient instructed on dry needling rationale, procedures, and possible side effects including pain during treatment (achy,cramping  feeling), bruising, drop of blood, lightheadedness, nausea, sweating. Patient Consent Given: Yes Education handout provided: Yes Muscles treated:  Bil quadratus lumborum  Needle size and number: .30x17mm x 2 Electrical stimulation performed: No Parameters: N/A Treatment response/outcome: Twitch response elicited and Palpable decrease in muscle tension Post-treatment instructions: Patient instructed to expect possible mild to moderate muscle soreness later today and/or tomorrow. Patient instructed in methods to reduce  muscle soreness and to continue prescribed HEP. If patient was dry needled over the lung field, patient was instructed on signs and symptoms of pneumothorax and, however unlikely, to see immediate medical attention should they occur. Patient was also educated on signs and symptoms of infection and to seek medical attention should they occur. Patient verbalized understanding of these instructions and education.   Modalities: Moist hot pack to low back concurrent with exercise                                                                                                                         [facility] Adult PT Treatment:                                                DATE: 04/25/22 Therapeutic Exercise: Nustep L5 UE/LE x 5 minutes  Open books standing at wall x 5 each  L stretch at sink Counter top- leg extensions- cues to avoid compensations- added UE briefly Prone lying to press up x 10 Clam shell SL x 12 each  Bridge Piriformis stretch  Modalities: IFC with HMP x 15 minutes prone, Lumbar     [facility] Adult PT Treatment:                                                DATE: 04/18/22 Therapeutic Exercise: Nustep L5 UE/LE x 5 minutes  Seated trunk rotation  Seated trunk extension  Supine LTR  Bridge x 10 Supine hamstring stretch with strap  Prone lying, to press ups 5 sec 10 x 2  Prone hip ext alternating  Side clam Piriformis stretch   Modalities: HMP x 5 minutes for education     [facility] Adult PT Treatment:                                                DATE: 04/04/22 Therapeutic Exercise: Seated thoracolumbar extension x10 cues for comfortable ROM Seated lumbar rotation x10 cues for comfortable ROM HEP + handout, education     PATIENT EDUCATION:  Education details: Pt education on PT impairments, prognosis, and POC. Informed consent. Rationale for interventions, safe/appropriate HEP performance Person educated: Patient Education  method: Explanation, Demonstration, Tactile cues,  Verbal cues, and Handouts Education comprehension: verbalized understanding, returned demonstration, verbal cues required, tactile cues required, and needs further education     HOME EXERCISE PROGRAM: Access Code: [email or web address] URL: [email or web address] Date: 05/02/2022 Prepared by: Garen Lah  Exercises - Seated Trunk Rotation - Arms Crossed  - 1 x daily - 7 x weekly - 2 sets - 10 reps - Seated Thoracic Lumbar Extension  - 1 x daily - 7 x weekly - 2 sets - 10 reps - childs pose stretch forward and to side  - 1 x daily - 7 x weekly - 1 sets - 4 reps - 30 sec hold - Prone Press Up  - 1 x daily - 7 x weekly - 2 sets - 10 reps - 5 sec hold - Prone Hip Extension  - 1 x daily - 7 x weekly - 3 sets - 10 reps - Supine Bridge  - 1 x daily - 7 x weekly - 2-3 sets - 10 reps - Beginner Clam  - 1 x daily - 7 x weekly - 2-3 sets - 10 reps - Supine Piriformis Stretch with Leg Straight  - 1 x daily - 7 x weekly - 1 sets - 3 reps - 30 hold   ASSESSMENT:   CLINICAL IMPRESSION: Ms Lippert presents with 9/10 bil back pain and consents to [facility] after education on TPDN.  Pt was closely monitored and had marked twitch responses. Pt reports she reduced form 9/10 to 5/10. Pt HEP updated today and STG achieved.  Pt had injection by Dr Richardean Canal yesterday in R knee and has 0/10 pain.  Will continue to achieved LTG with subsequent visits.  Pt already given information on TENS but has not purchased.    EVAL: Pt reports ongoing back + R knee pain since return to work after R TKA last year, tends to worsen as day goes on. On exam today pt demos limitations in lumbar mobility with extension preference, mild RLE weakness compared to L.   OBJECTIVE IMPAIRMENTS: decreased activity tolerance, decreased endurance, decreased mobility, difficulty walking, decreased ROM, decreased strength, and pain.    ACTIVITY LIMITATIONS: carrying, lifting, bending, standing, squatting, stairs, transfers, and locomotion  level   PARTICIPATION LIMITATIONS: cleaning, laundry, community activity, and occupation   PERSONAL FACTORS: Time since onset of injury/illness/exacerbation and 1-2 comorbidities: CAD, HTN  are also affecting patient's functional outcome.    REHAB POTENTIAL: Good   CLINICAL DECISION MAKING: Stable/uncomplicated   EVALUATION COMPLEXITY: Low     GOALS: Goals reviewed with patient? No   SHORT TERM GOALS: Target date: 05/02/2022   Pt will demonstrate appropriate understanding and performance of initially prescribed HEP in order to facilitate improved independence with management of symptoms.  Baseline: HEP provided on eval Goal status: MET     LONG TERM GOALS: Target date: 05/30/2022     Pt will score less than or equal to 8% on Modified Oswestry in order to demonstrate improved perception of functional status due to symptoms.  Baseline: 16% Goal status: INITIAL   2.  Pt will demonstrate symmetrical and painless lumbar rotation AROM in order to demonstrate improved tolerance to functional movement patterns.  Baseline: see ROM chart above Goal status: INITIAL   3.  Pt will demonstrate grossly symmetrical LE MMT globally in order to demonstrate improved strength for functional movements.  Baseline: see MMT chart above Goal status: INITIAL   4. Pt will be able to lift up to 25# with  less than 2 pt increase in pain on NPS for 5 repetitions in order to improve tolerance to work tasks.             Baseline: pain with lifting at work            Goal status: INITIAL    PLAN:   PT FREQUENCY: 1x/week   PT DURATION: 8 weeks   PLANNED INTERVENTIONS: Therapeutic exercises, Therapeutic activity, Neuromuscular re-education, Balance training, Gait training, Patient/Family education, Self Care, Joint mobilization, Stair training, DME instructions, Aquatic Therapy, Dry Needling, Electrical stimulation, Spinal mobilization, Cryotherapy, Moist heat, Taping, Manual therapy, and Re-evaluation.    PLAN FOR NEXT SESSION: Progress ROM/strengthening exercises as able/appropriate, review HEP.  Start hip hinge education assess response to TENS, DRY needle?  Garen Lah, PT, ATRIC Certified Exercise Expert for the Aging Adult  05/02/22 8:56 AM Phone: [phone] Fax: [phone]

## 2022-05-01 ENCOUNTER — Ambulatory Visit (INDEPENDENT_AMBULATORY_CARE_PROVIDER_SITE_OTHER): Payer: Medicaid Other | Admitting: Physician Assistant

## 2022-05-01 ENCOUNTER — Encounter: Payer: Self-pay | Admitting: Physician Assistant

## 2022-05-01 DIAGNOSIS — M7051 Other bursitis of knee, right knee: Secondary | ICD-10-CM | POA: Diagnosis not present

## 2022-05-01 MED ORDER — LIDOCAINE HCL 1 % IJ SOLN
1.0000 mL | INTRAMUSCULAR | Status: AC | PRN
  Administered 2022-05-01: 1 mL

## 2022-05-01 MED ORDER — METHYLPREDNISOLONE ACETATE 40 MG/ML IJ SUSP
20.0000 mg | INTRAMUSCULAR | Status: AC | PRN
  Administered 2022-05-01: 20 mg via INTRAMUSCULAR

## 2022-05-01 NOTE — Progress Notes (Signed)
HPI: Tracy Mercado returns today for follow-up of her right leg radicular pain.  She has been to therapy.  She states therapy has really been beneficial.  She did find the TENS unit beneficial.  She does note that the gabapentin and Zanaflex have helped.  She is no longer having any radicular symptoms down the right leg.  She notes she still has pain stabbing pain and points to the anterior medial aspect of the right knee.  Again history of right total knee arthroplasty 04/05/2021 which overall is doing well.  She does continue to have some low back pain.  Review of systems: Negative for fevers chills.  See HPI otherwise negative or noncontributory.  Physical exam: General well-developed well-nourished female no acute distress mood affect appropriate. Lumbar spine she comes within 4 inches of being out touch toes.  Exquisitely tight hamstrings bilaterally.  Negative straight leg raise bilaterally. Right knee full range of motion.  No instability with valgus varus stressing.  Tenderness over the pes anserinus region.     Procedure Note                     Procedures: Visit Diagnoses:  1. Pes anserinus bursitis of right knee     Trigger Point Inj  Date/Time: 05/01/2022 12:07 PM  Performed by: Kirtland Bouchard, PA-C Authorized by: Kirtland Bouchard, PA-C   Consent Given by:  Patient Site marked: the procedure site was marked   Timeout: prior to procedure the correct patient, procedure, and site was verified   Indications:  Pain and therapeutic Total # of Trigger Points:  1 Location: lower extremity   Approach:  Medial Medications #1:  1 mL lidocaine 1 %; 20 mg methylPREDNISolone acetate 40 MG/ML   Plan she will continue home exercise program as taught by therapy for her back.  Continue the Zanaflex and gabapentin.  She can call us if she needs refills.  Regards to her knee if pain persist or becomes worse she will follow-up with [place].  Otherwise follow-up as needed.  Questions were  encouraged and answered at length

## 2022-05-02 ENCOUNTER — Ambulatory Visit: Payer: Medicaid Other | Admitting: Physical Therapy

## 2022-05-02 ENCOUNTER — Encounter: Payer: Self-pay | Admitting: Physical Therapy

## 2022-05-02 DIAGNOSIS — M5459 Other low back pain: Secondary | ICD-10-CM | POA: Diagnosis not present

## 2022-05-02 DIAGNOSIS — M6281 Muscle weakness (generalized): Secondary | ICD-10-CM

## 2022-05-02 DIAGNOSIS — R600 Localized edema: Secondary | ICD-10-CM

## 2022-05-02 DIAGNOSIS — M25661 Stiffness of right knee, not elsewhere classified: Secondary | ICD-10-CM

## 2022-05-02 DIAGNOSIS — R2689 Other abnormalities of gait and mobility: Secondary | ICD-10-CM

## 2022-05-02 DIAGNOSIS — M25561 Pain in right knee: Secondary | ICD-10-CM

## 2022-05-02 NOTE — Patient Instructions (Addendum)

## 2022-05-09 ENCOUNTER — Ambulatory Visit: Payer: Medicaid Other | Attending: Physician Assistant | Admitting: Physical Therapy

## 2022-05-09 DIAGNOSIS — M5459 Other low back pain: Secondary | ICD-10-CM | POA: Diagnosis present

## 2022-05-09 DIAGNOSIS — M25561 Pain in right knee: Secondary | ICD-10-CM | POA: Insufficient documentation

## 2022-05-09 DIAGNOSIS — R2689 Other abnormalities of gait and mobility: Secondary | ICD-10-CM | POA: Diagnosis present

## 2022-05-09 NOTE — Therapy (Signed)
OUTPATIENT PHYSICAL THERAPY TREATMENT NOTE   Patient Name: Tracy Mercado MRN: [id number] DOB:August 27, 1960, 62 y.o., female Today's Date: 05/09/2022  PCP: no PCP in chart   REFERRING PROVIDER: Kirtland Bouchard, PA-C  END OF SESSION:   PT End of Session - 05/09/22 0806     Visit Number 5    Number of Visits 9    Date for PT Re-Evaluation 05/30/22    Authorization Type [place] [facility] [employer]    Authorization Time Period no auth, 27 VL    PT Start Time 0802    PT Stop Time 0840    PT Time Calculation (min) 38 min              Past Medical History:  Diagnosis Date   Arthritis    CAD (coronary artery disease)    Hypertension    Past Surgical History:  Procedure Laterality Date   HAND SURGERY Left    KNEE ARTHROSCOPY     TOTAL KNEE ARTHROPLASTY Right 04/05/2021   Procedure: Right TOTAL KNEE ARTHROPLASTY;  Surgeon: Kathryne Hitch, MD;  Location: [facility] OR;  Service: Orthopedics;  Laterality: Right;   Patient Active Problem List   Diagnosis Date Noted   Status post right knee replacement 04/05/2021   Unilateral primary osteoarthritis, right knee 01/10/2021    REFERRING DIAG: M79.604 (ICD-10-CM) - Pain in right leg  note: "LBP - work on strengthening, stretching, HEP, and modalities"  THERAPY DIAG:  Other low back pain  Right knee pain, unspecified chronicity  Other abnormalities of gait and mobility  Rationale for Evaluation and Treatment rehabilitation  PERTINENT HISTORY:  Hx R TKA, CAD, HTN  PRECAUTIONS: none  SUBJECTIVE:                                                                                                                                                                                      SUBJECTIVE STATEMENT: I am doing my exercises. I do not have any pain right now. I did ok at work yesterday, pain 3/10 at most).    PAIN:  Are you having pain:0/10 Location/description: low back (along belt line) and R knee, some distal referral but not all the  way into foot Best-worst over past week: 0-3/10  Per eval -  - aggravating factors: vacuuming, walking/standing >2 hrs, sitting for a while, lying on back, lifting heavy items at work  - Easing factors: rest     OBJECTIVE: (objective measures completed at initial evaluation unless otherwise dated)   DIAGNOSTIC FINDINGS:  XR lumbar spine and R knee 03/14/22 impressions reassuring (see epic for details)   PATIENT SURVEYS:  Oswestry: 8/50, 16%  SCREENING FOR RED FLAGS: Red flag questioning/screening reassuring     COGNITION: Overall cognitive status: Within functional limits for tasks assessed                          SENSATION: Light touch intact all dermatomes, mild light touch diminished lateral R knee from surgery      POSTURE: guarded UT BIL, increased FHP and kyphosis   PALPATION: Deferred given time constraints   LUMBAR ROM:    AROM eval 04/25/22 05/09/22  Flexion 100% *    Extension 100* relieving     Right lateral flexion      Left lateral flexion      Right rotation 100% pain 100% no pain  Left rotation 25% * 50% 75% no pain   (Blank rows = not tested) (Key: WFL = within functional limits not formally assessed, * = concordant pain, s = stiffness/stretching sensation, NT = not tested)  Comments: repeated extensions with improved symptoms but does not subsequently change flexion tolerance   LOWER EXTREMITY ROM:      Active  Right eval Left eval Right 04/18/22  Hip flexion       Hip extension       Hip internal rotation       Hip external rotation       Knee extension       Knee flexion     115  (Blank rows = not tested) (Key: WFL = within functional limits not formally assessed, * = concordant pain, s = stiffness/stretching sensation, NT = not tested)  Comments:     LOWER EXTREMITY MMT:     MMT Right eval Left eval  Hip flexion 4 knee pain 5  Hip abduction (modified sitting) 5 54  Hip internal rotation      Hip external rotation      Knee flexion  4 hamstring cramp 5  Knee extension 5 5  Ankle dorsiflexion 5 5    (Blank rows = not tested) (Key: WFL = within functional limits not formally assessed, * = concordant pain, s = stiffness/stretching sensation, NT = not tested)  Comments:     FUNCTIONAL TESTS:  5 times sit to stand: 9.79 sec no UE support, no increase in pain   GAIT: Distance walked: within clinic Assistive device utilized: None Level of assistance: Complete Independence Comments: gait mechanics grossly WNL   TODAY'S TREATMENT:   [facility] Adult PT Treatment:                                                DATE: 05/09/22 Therapeutic Exercise: Open books  Prone pressups  Marjo Bicker pose  SKTC Therapeutic Activity: Hip hinge with dowel- used wall for feedback  Lifting 10# from 8 inch step - max cues for correct form    [facility] Adult PT Treatment:                                                DATE: 05-02-22 Therapeutic Exercise: Open book in sidelying on R and L x 10 Childs pose forward, and side to side 2 rounds 15 seconds each Clamshell with GTB 3 x 10 R and L Bridge Piriformis  stretch Prone lying, to press ups 5 sec 10 x 2  Prone hip ext alternating  Pelvic press  Manual Therapy: STW/Myofasical release to bil Quadratus Lumborum Trigger Point Dry Needling Treatment: Pre-treatment instruction: Patient instructed on dry needling rationale, procedures, and possible side effects including pain during treatment (achy,cramping feeling), bruising, drop of blood, lightheadedness, nausea, sweating. Patient Consent Given: Yes Education handout provided: Yes Muscles treated:  Bil quadratus lumborum  Needle size and number: .30x4mm x 2 Electrical stimulation performed: No Parameters: N/A Treatment response/outcome: Twitch response elicited and Palpable decrease in muscle tension Post-treatment instructions: Patient instructed to expect possible mild to moderate muscle soreness later today and/or tomorrow. Patient instructed  in methods to reduce muscle soreness and to continue prescribed HEP. If patient was dry needled over the lung field, patient was instructed on signs and symptoms of pneumothorax and, however unlikely, to see immediate medical attention should they occur. Patient was also educated on signs and symptoms of infection and to seek medical attention should they occur. Patient verbalized understanding of these instructions and education.   Modalities: Moist hot pack to low back concurrent with exercise                                                                                                                         [facility] Adult PT Treatment:                                                DATE: 04/25/22 Therapeutic Exercise: Nustep L5 UE/LE x 5 minutes  Open books standing at wall x 5 each  L stretch at sink Counter top- leg extensions- cues to avoid compensations- added UE briefly Prone lying to press up x 10 Clam shell SL x 12 each  Bridge Piriformis stretch  Modalities: IFC with HMP x 15 minutes prone, Lumbar     [facility] Adult PT Treatment:                                                DATE: 04/18/22 Therapeutic Exercise: Nustep L5 UE/LE x 5 minutes  Seated trunk rotation  Seated trunk extension  Supine LTR  Bridge x 10 Supine hamstring stretch with strap  Prone lying, to press ups 5 sec 10 x 2  Prone hip ext alternating  Side clam Piriformis stretch   Modalities: HMP x 5 minutes for education     [facility] Adult PT Treatment:                                                DATE: 04/04/22  Therapeutic Exercise: Seated thoracolumbar extension x10 cues for comfortable ROM Seated lumbar rotation x10 cues for comfortable ROM HEP + handout, education     PATIENT EDUCATION:  Education details: Pt education on PT impairments, prognosis, and POC. Informed consent. Rationale for interventions, safe/appropriate HEP performance Person educated: Patient Education method: Explanation,  Demonstration, Tactile cues, Verbal cues, and Handouts Education comprehension: verbalized understanding, returned demonstration, verbal cues required, tactile cues required, and needs further education     HOME EXERCISE PROGRAM: Access Code: [email or web address] URL: [email or web address] Date: 05/02/2022 Prepared by: Garen Lah  Exercises - Seated Trunk Rotation - Arms Crossed  - 1 x daily - 7 x weekly - 2 sets - 10 reps - Seated Thoracic Lumbar Extension  - 1 x daily - 7 x weekly - 2 sets - 10 reps - childs pose stretch forward and to side  - 1 x daily - 7 x weekly - 1 sets - 4 reps - 30 sec hold - Prone Press Up  - 1 x daily - 7 x weekly - 2 sets - 10 reps - 5 sec hold - Prone Hip Extension  - 1 x daily - 7 x weekly - 3 sets - 10 reps - Supine Bridge  - 1 x daily - 7 x weekly - 2-3 sets - 10 reps - Beginner Clam  - 1 x daily - 7 x weekly - 2-3 sets - 10 reps - Supine Piriformis Stretch with Leg Straight  - 1 x daily - 7 x weekly - 1 sets - 3 reps - 30 hold   ASSESSMENT:   CLINICAL IMPRESSION: Ms Bunney presents with 0/10 back pain and reports improvement after TPDN although she did not like the treatment. Today focused session on hip hinge education with max cues required. Use of wooden dowel and wall for feedback proved beneficial. Used patient's camera on her phone to educate on her body mechanics. At end of session pt was able to demonstrate hp hinge using dowel and wall. She was given this as HEP for continued practice. Will nee reinforcement at future session. Continued with Lumbar ROM for progression toward LTGs.     EVAL: Pt reports ongoing back + R knee pain since return to work after R TKA last year, tends to worsen as day goes on. On exam today pt demos limitations in lumbar mobility with extension preference, mild RLE weakness compared to L.   OBJECTIVE IMPAIRMENTS: decreased activity tolerance, decreased endurance, decreased mobility, difficulty walking, decreased  ROM, decreased strength, and pain.    ACTIVITY LIMITATIONS: carrying, lifting, bending, standing, squatting, stairs, transfers, and locomotion level   PARTICIPATION LIMITATIONS: cleaning, laundry, community activity, and occupation   PERSONAL FACTORS: Time since onset of injury/illness/exacerbation and 1-2 comorbidities: CAD, HTN  are also affecting patient's functional outcome.    REHAB POTENTIAL: Good   CLINICAL DECISION MAKING: Stable/uncomplicated   EVALUATION COMPLEXITY: Low     GOALS: Goals reviewed with patient? No   SHORT TERM GOALS: Target date: 05/02/2022   Pt will demonstrate appropriate understanding and performance of initially prescribed HEP in order to facilitate improved independence with management of symptoms.  Baseline: HEP provided on eval Goal status: MET     LONG TERM GOALS: Target date: 05/30/2022     Pt will score less than or equal to 8% on Modified Oswestry in order to demonstrate improved perception of functional status due to symptoms.  Baseline: 16% Goal status: INITIAL   2.  Pt will demonstrate symmetrical and painless  lumbar rotation AROM in order to demonstrate improved tolerance to functional movement patterns.  Baseline: see ROM chart above Goal status: INITIAL   3.  Pt will demonstrate grossly symmetrical LE MMT globally in order to demonstrate improved strength for functional movements.  Baseline: see MMT chart above Goal status: INITIAL   4. Pt will be able to lift up to 25# with less than 2 pt increase in pain on NPS for 5 repetitions in order to improve tolerance to work tasks.             Baseline: pain with lifting at work            Goal status: INITIAL    PLAN:   PT FREQUENCY: 1x/week   PT DURATION: 8 weeks   PLANNED INTERVENTIONS: Therapeutic exercises, Therapeutic activity, Neuromuscular re-education, Balance training, Gait training, Patient/Family education, Self Care, Joint mobilization, Stair training, DME instructions,  Aquatic Therapy, Dry Needling, Electrical stimulation, Spinal mobilization, Cryotherapy, Moist heat, Taping, Manual therapy, and Re-evaluation.   PLAN FOR NEXT SESSION: Progress ROM/strengthening exercises as able/appropriate, review HEP.  Continue hip hinge progression  (requires significant cues) , add to core program  Jannette Spanner, PTA 05/09/22 12:54 PM Phone: [phone] Fax: [phone]

## 2022-05-16 ENCOUNTER — Ambulatory Visit: Payer: Medicaid Other | Admitting: Physical Therapy

## 2022-05-23 ENCOUNTER — Ambulatory Visit: Payer: Medicaid Other | Admitting: Physical Therapy

## 2022-05-30 ENCOUNTER — Ambulatory Visit: Payer: Medicaid Other | Admitting: Physical Therapy

## 2022-06-12 ENCOUNTER — Ambulatory Visit: Payer: Medicaid Other | Admitting: Orthopaedic Surgery

## 2022-06-20 ENCOUNTER — Other Ambulatory Visit: Payer: Self-pay | Admitting: Physician Assistant

## 2022-07-31 ENCOUNTER — Other Ambulatory Visit: Payer: Self-pay | Admitting: Physician Assistant

## 2022-08-23 ENCOUNTER — Other Ambulatory Visit: Payer: Self-pay | Admitting: Physician Assistant

## 2022-09-11 ENCOUNTER — Other Ambulatory Visit: Payer: Self-pay | Admitting: Physician Assistant

## 2022-09-11 MED ORDER — TIZANIDINE HCL 4 MG PO TABS: ORAL_TABLET | ORAL | 1 refills | Status: DC

## 2022-11-15 IMAGING — CR DG LUMBAR SPINE COMPLETE 4+V
5 series · 5 of 5 positions shown · non-contrast
Comparison: None.

CLINICAL DATA: Pain

EXAM:
LUMBAR SPINE - COMPLETE 4+ VIEW

[t lumbar spine ap]
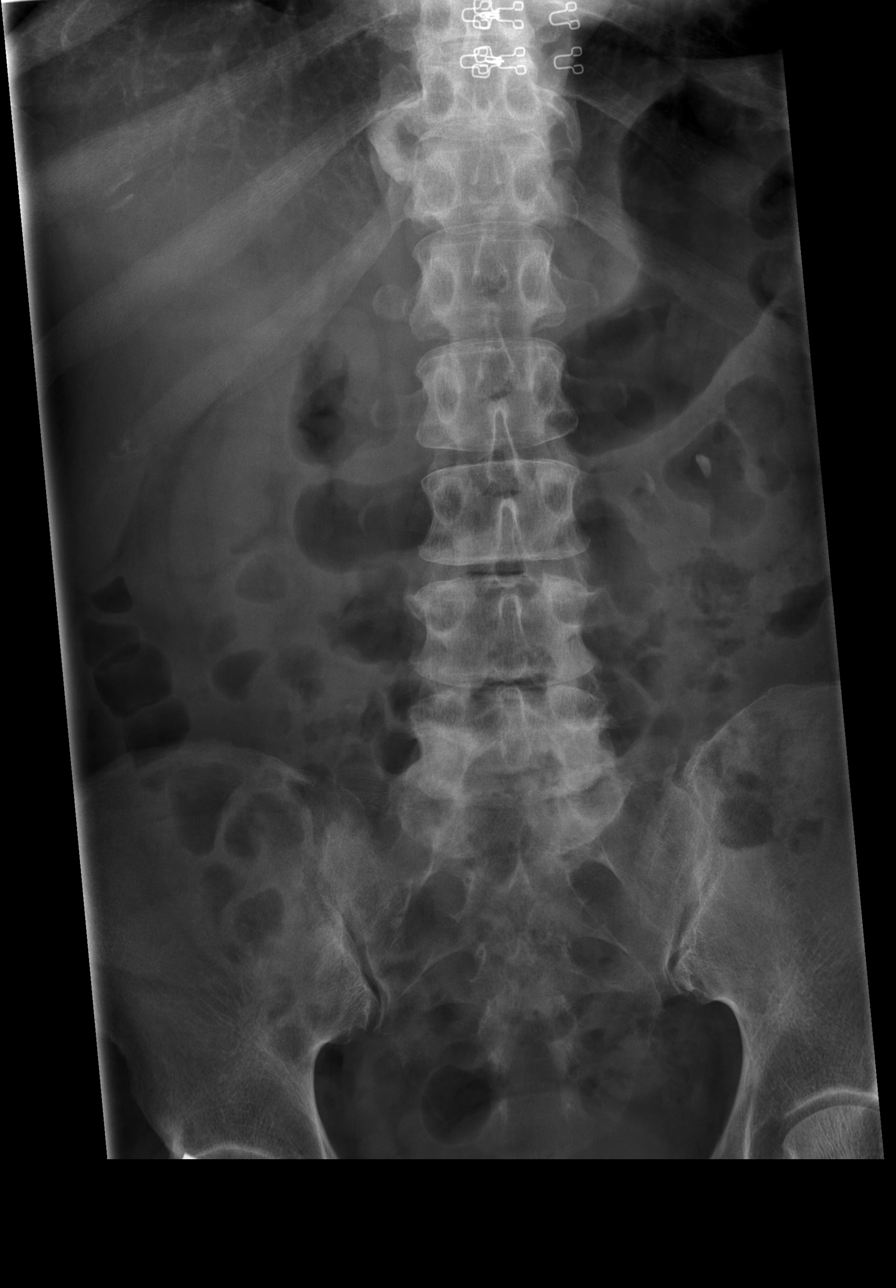

[t lumbar spine obl (1 of 2)]
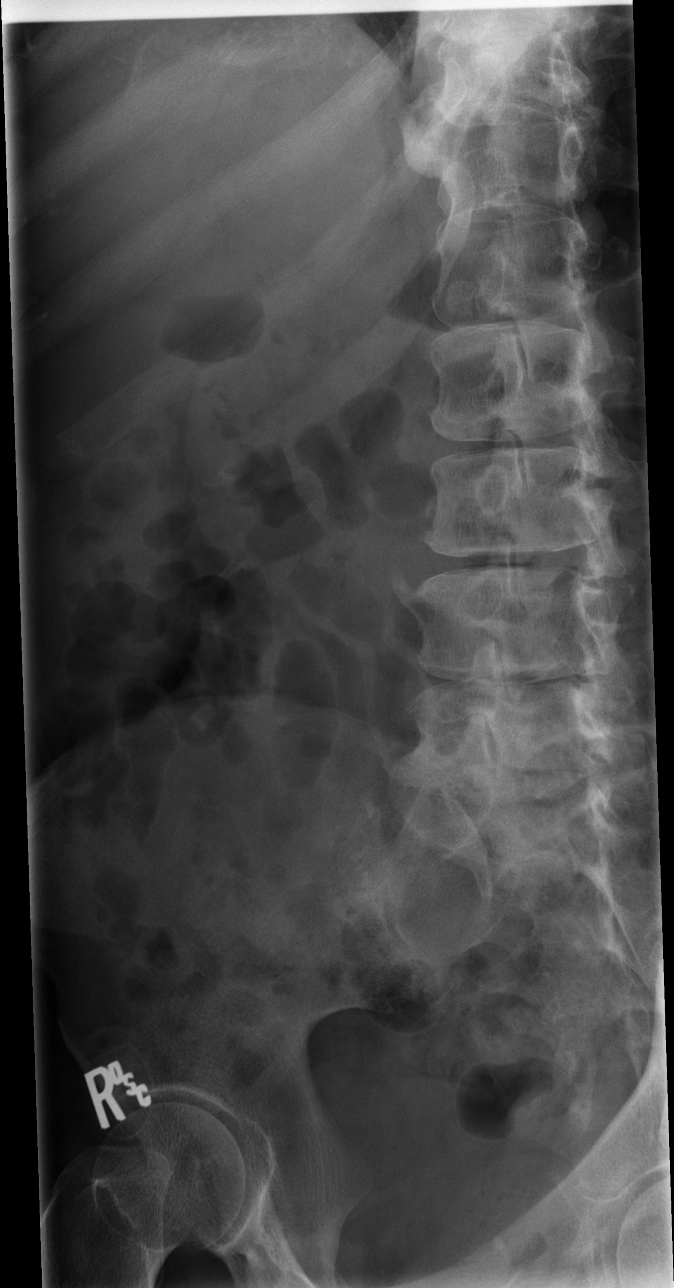

[t lumbar spine obl (2 of 2)]
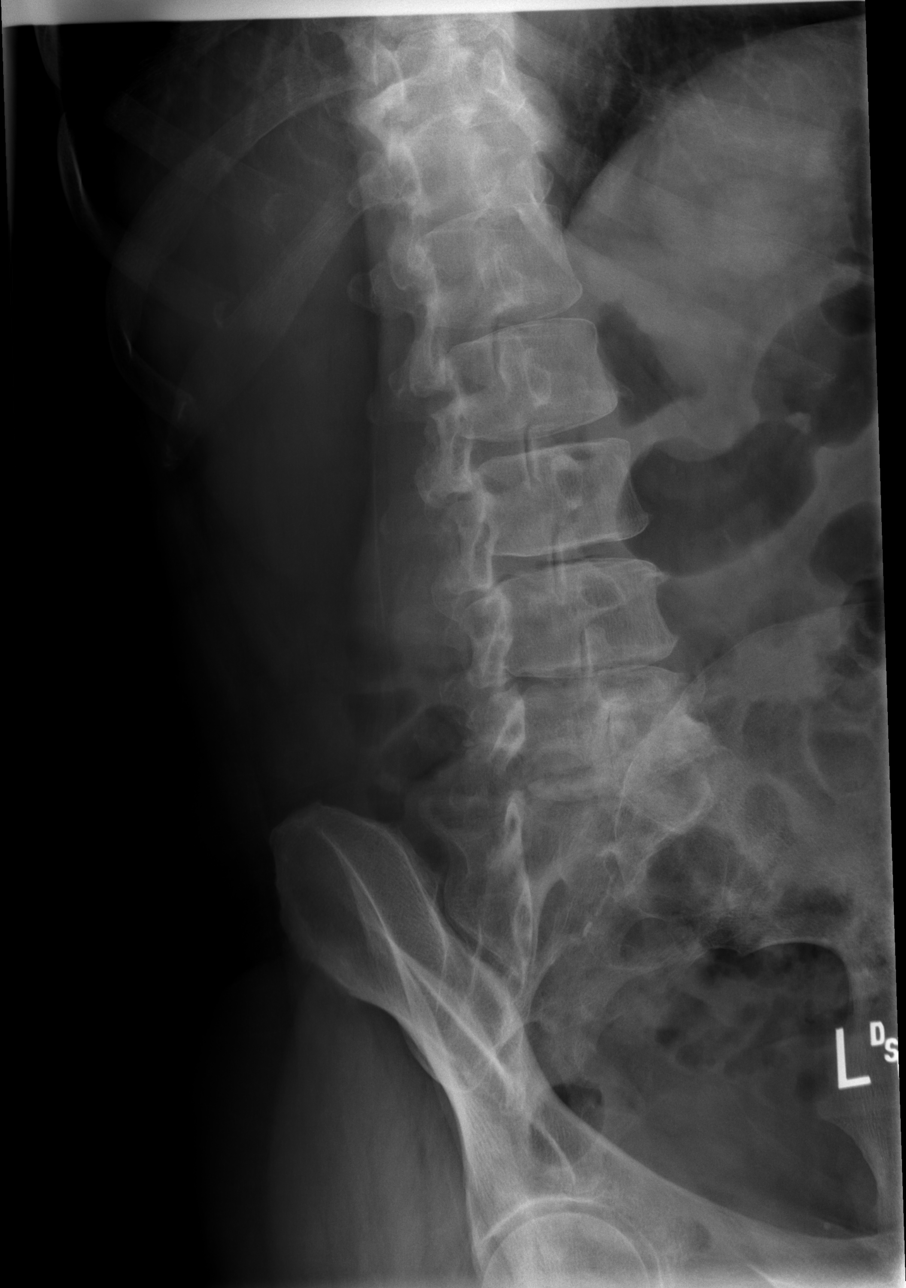

[t lumbar spine lat]
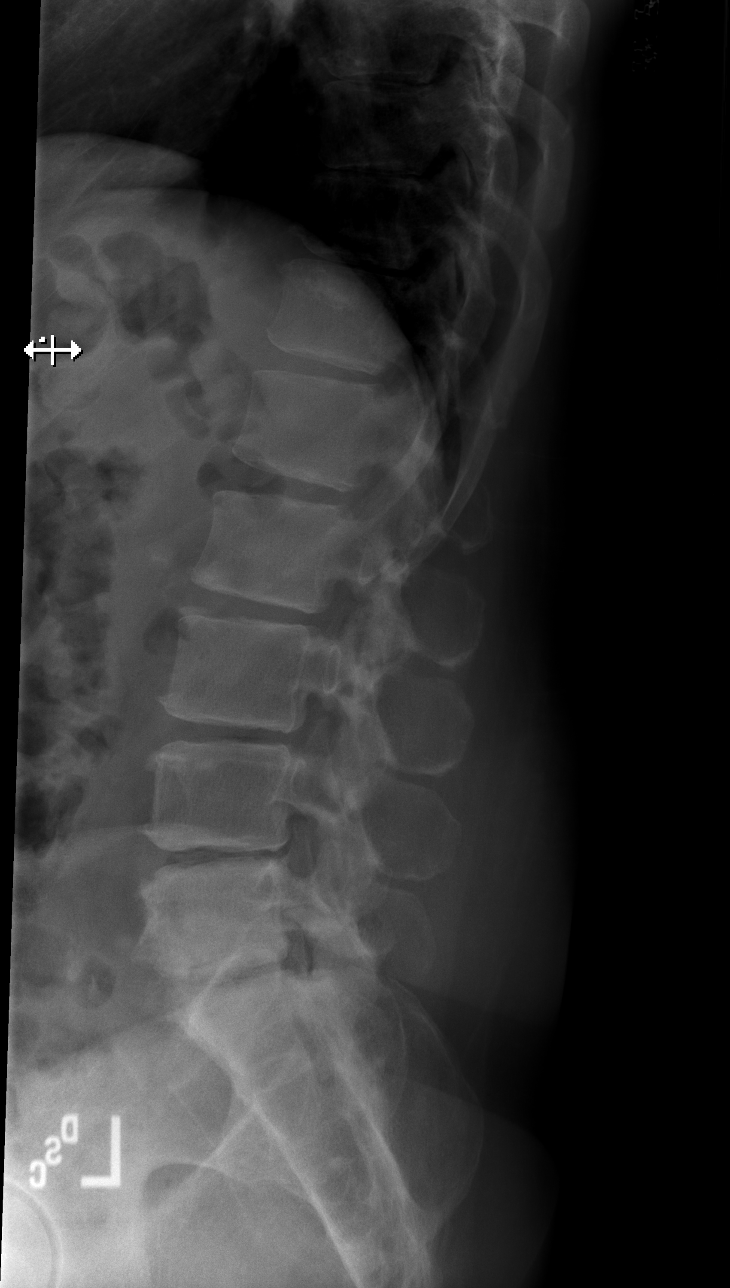

[t lumbar l-5 s-1 spot]
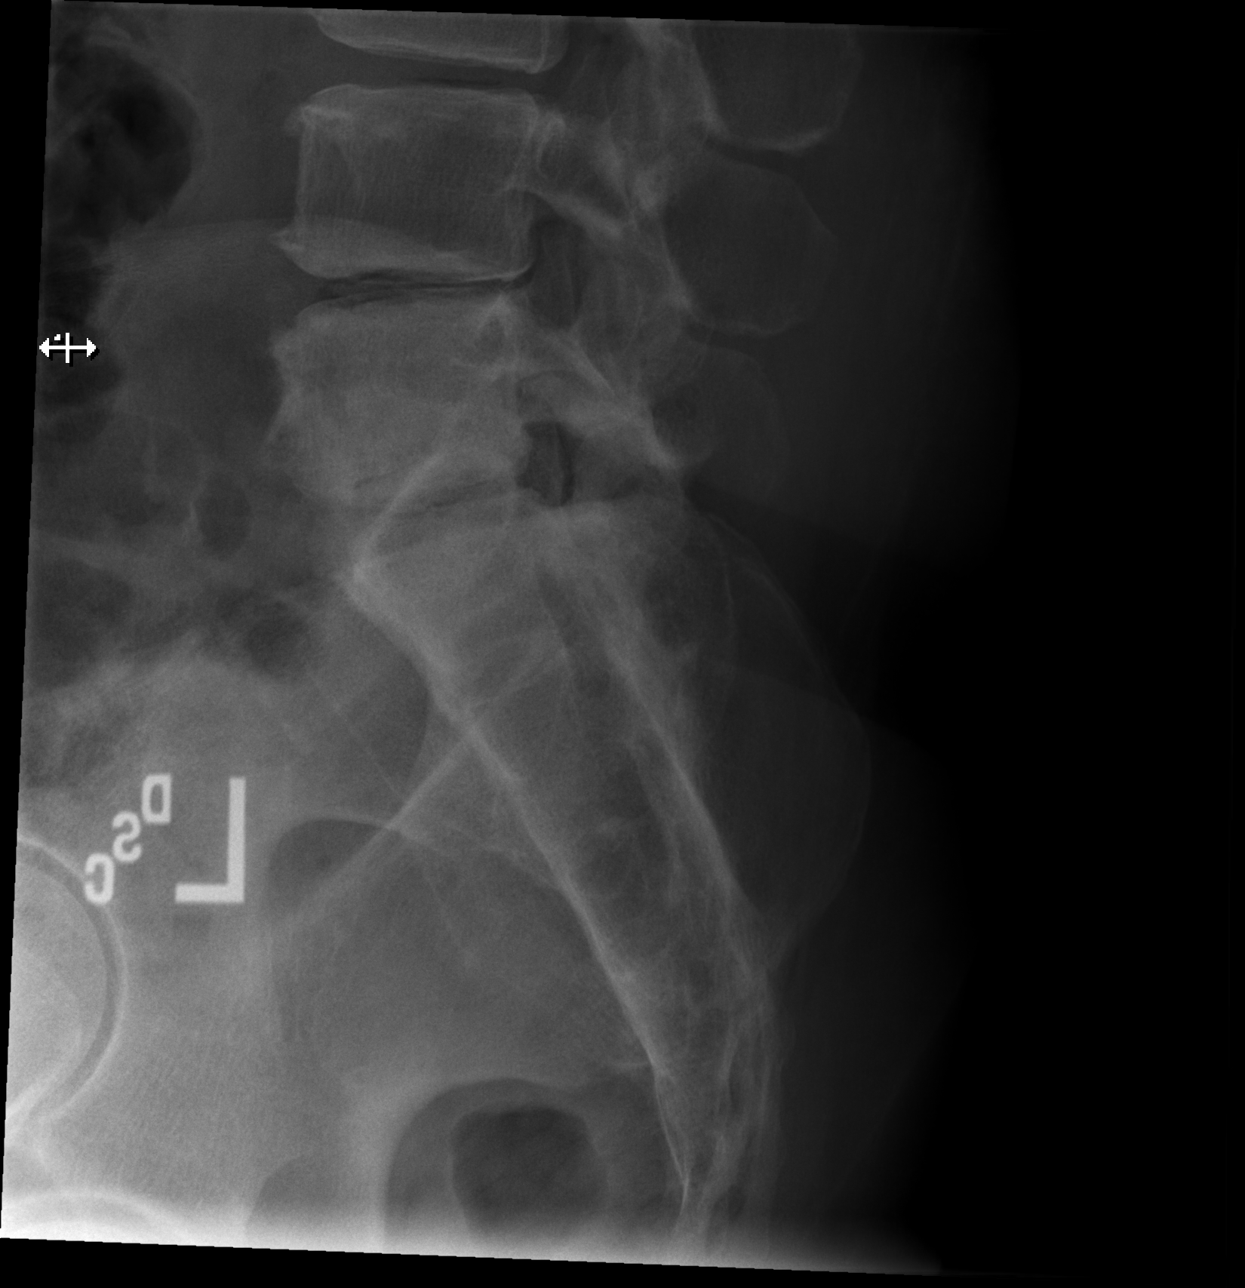

[5 of 5 positions shown; findings below may reference images not displayed]

FINDINGS: There is a calcification projecting over the expected region of the
left kidney. The bowel gas pattern where visualized is unremarkable.
There is no acute compression fracture. No significant malalignment.
Moderate disc height loss is noted throughout the lumbar spine,
greatest at the L4-L5 and L5-S1 levels. There is facet arthrosis in
the lower lumbar segments.
IMPRESSION: 1. No acute compression fracture or significant malalignment.
2. Moderate multilevel degenerative disc disease and facet
arthrosis.
3. Probable left-sided nephrolithiasis.

## 2022-11-15 IMAGING — CR DG WRIST COMPLETE 3+V*R*
4 series · 4 of 4 positions shown · non-contrast
Comparison: None.

CLINICAL DATA: Pain

EXAM:
RIGHT WRIST - COMPLETE 3+ VIEW; RIGHT FOREARM - 2 VIEW

[x wrist pa right]
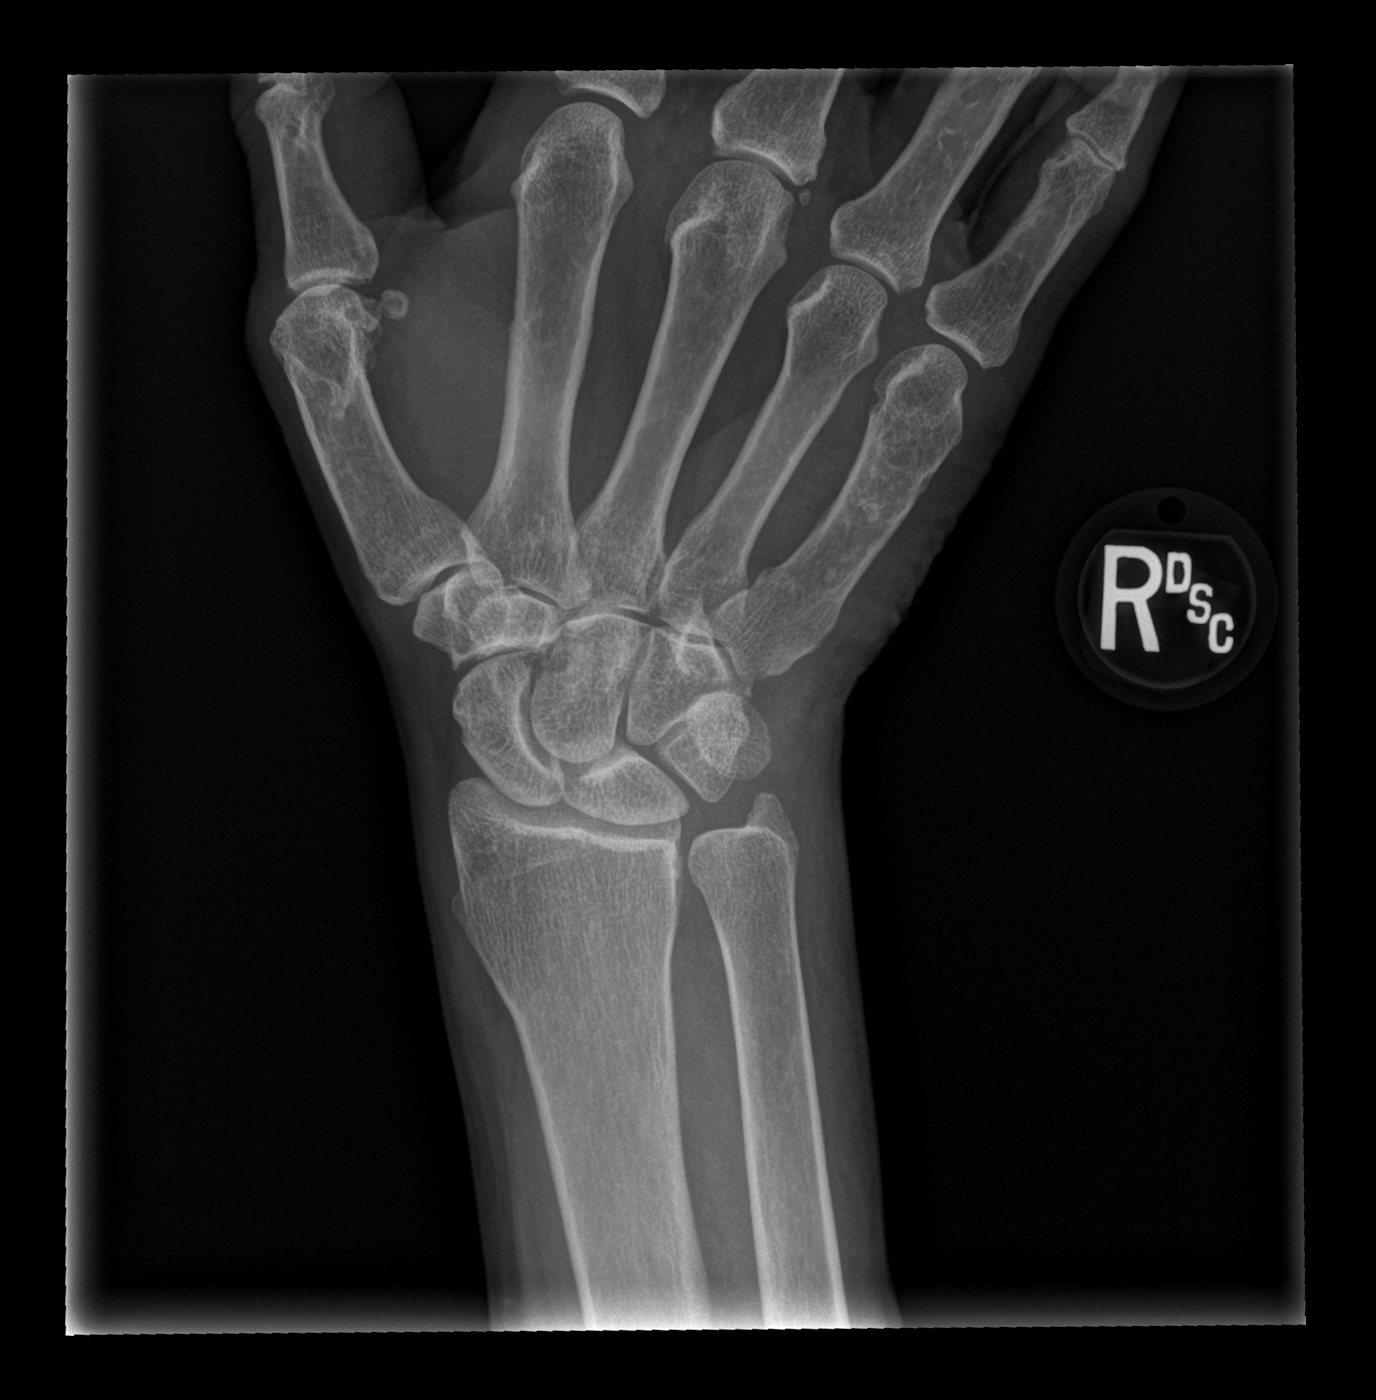

[x wrist obl right]
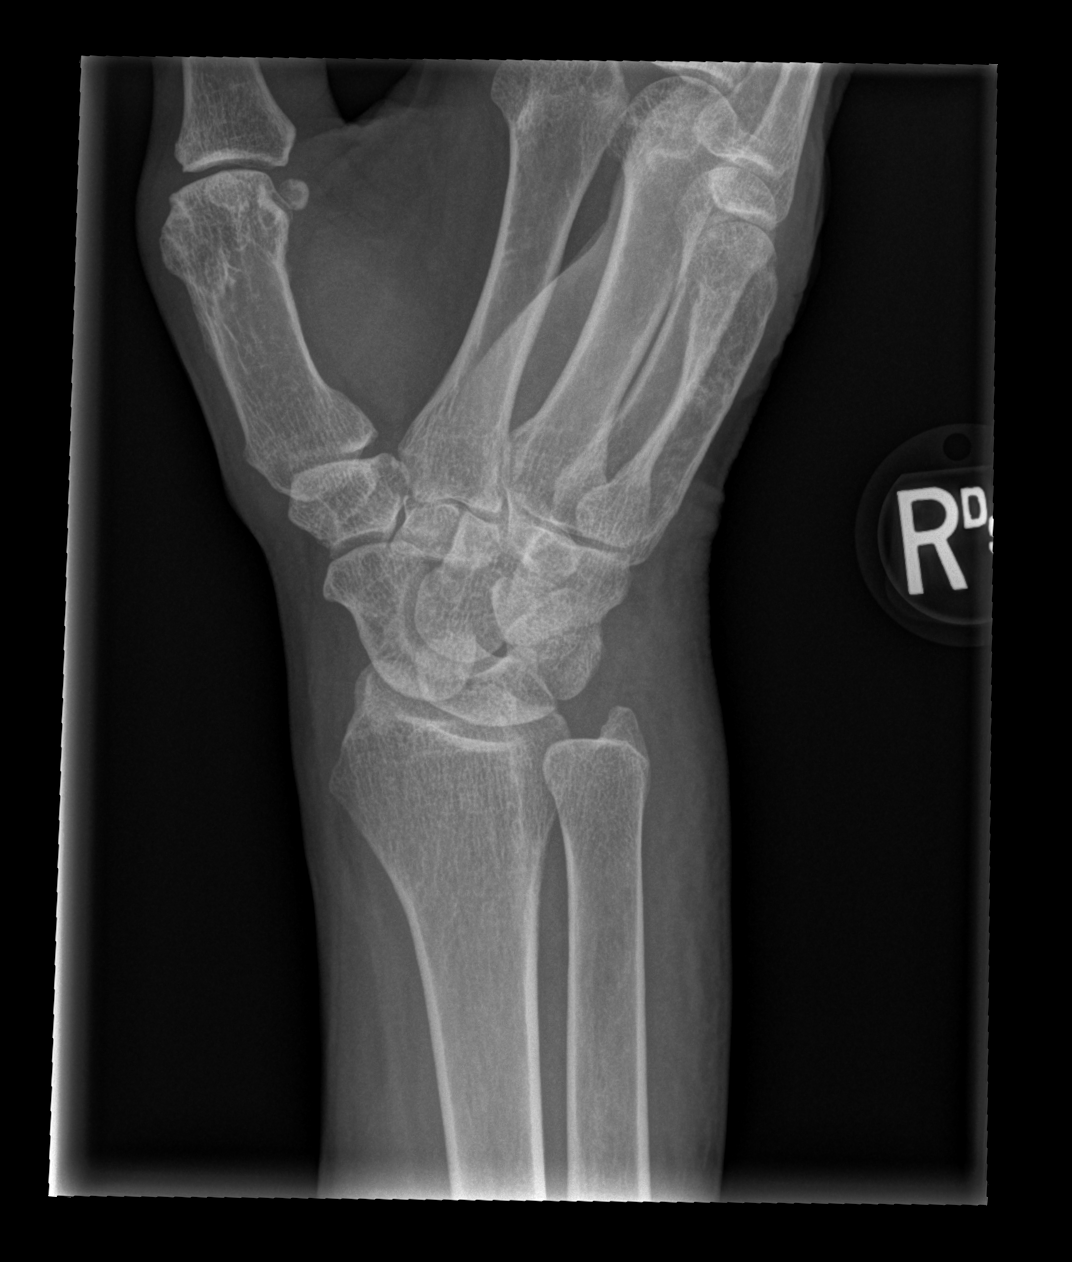

[x wrist lat right]
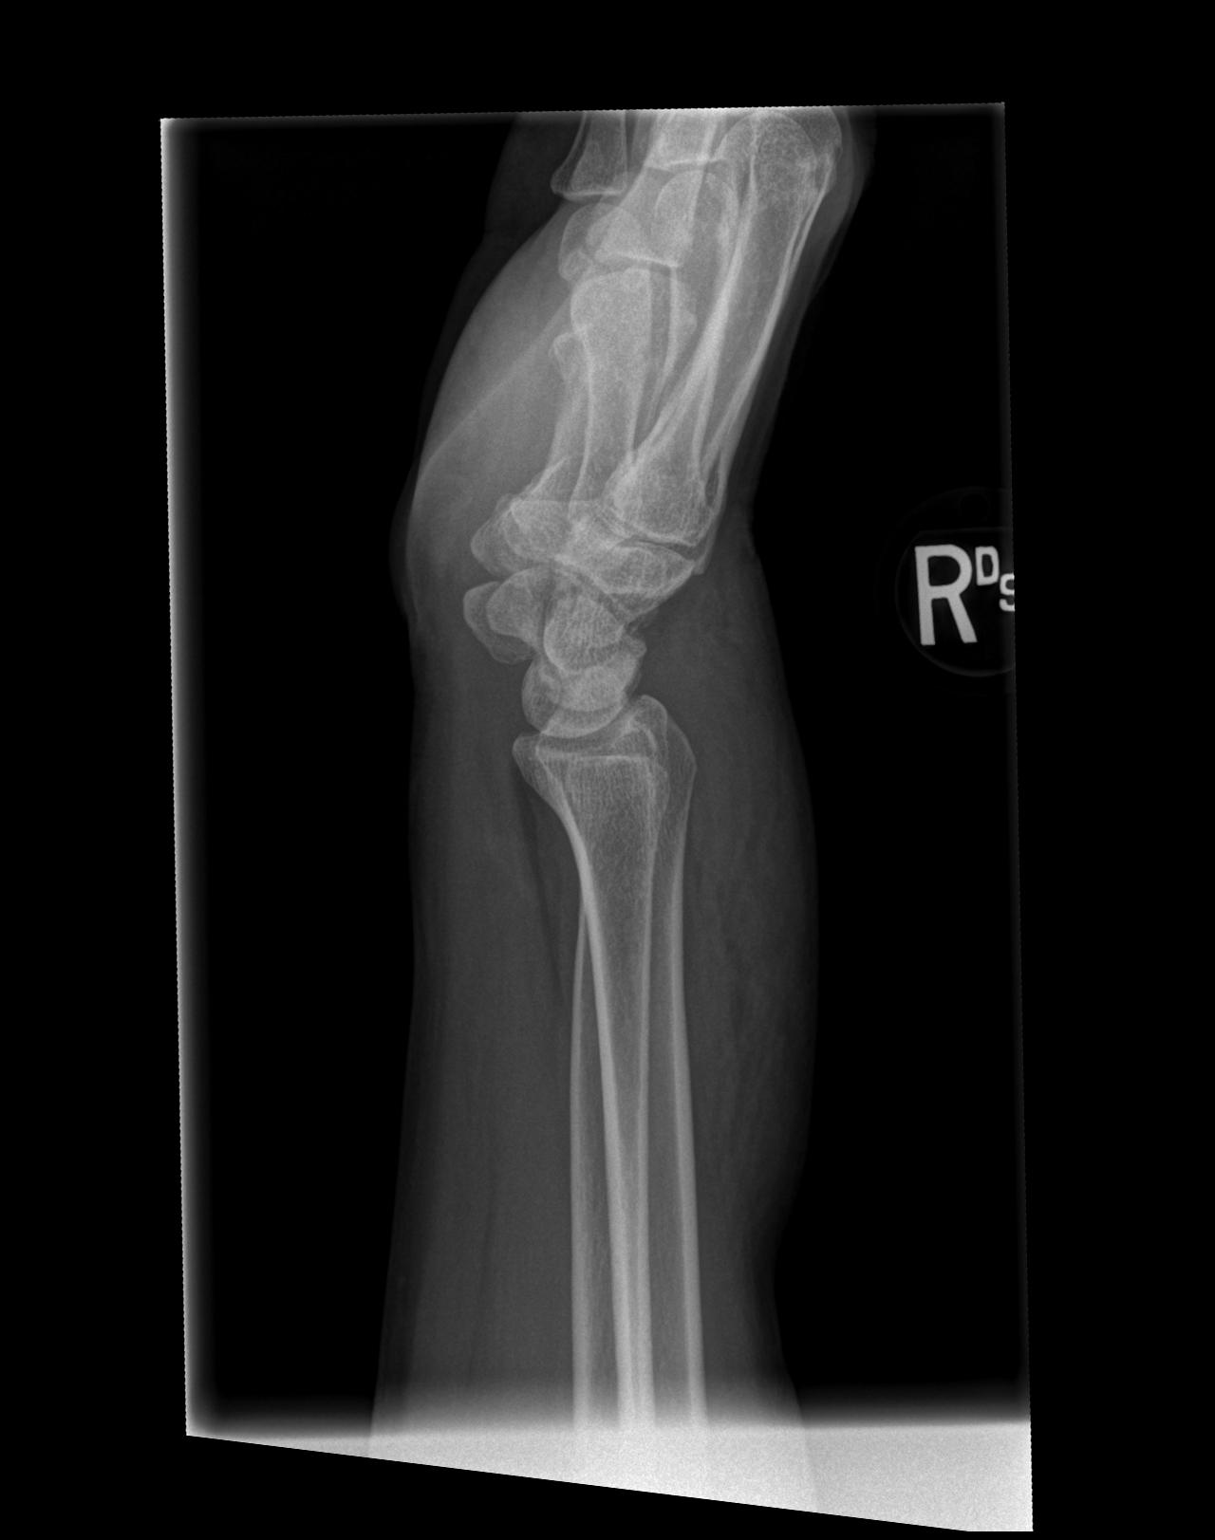

[x wrist navicular view right]
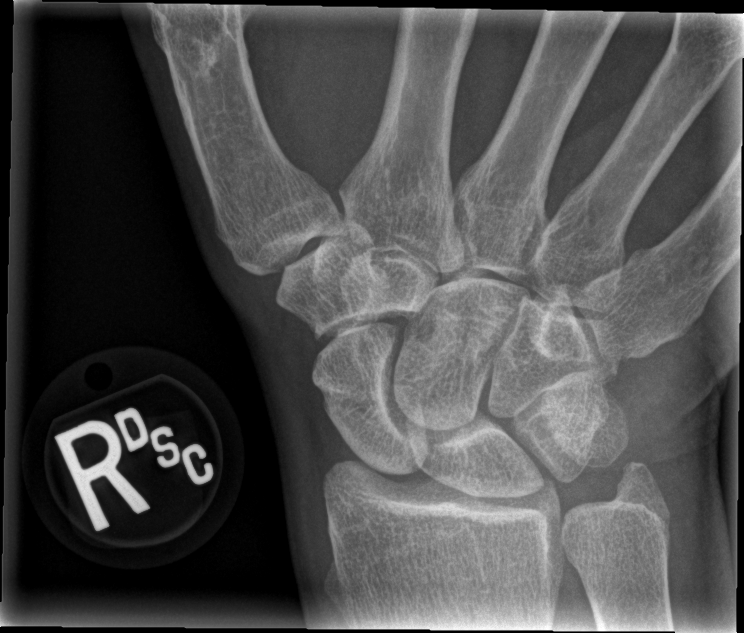

[4 of 4 positions shown; findings below may reference images not displayed]

FINDINGS: There is soft tissue swelling about the distal forearm without
evidence for an underlying acute displaced fracture or dislocation.
There is no radiopaque foreign body.
IMPRESSION: Soft tissue swelling without evidence for an underlying acute
displaced fracture or dislocation.

## 2022-12-04 ENCOUNTER — Ambulatory Visit: Payer: Medicaid Other | Admitting: Orthopaedic Surgery

## 2022-12-04 ENCOUNTER — Encounter: Payer: Self-pay | Admitting: Orthopaedic Surgery

## 2022-12-04 ENCOUNTER — Other Ambulatory Visit (INDEPENDENT_AMBULATORY_CARE_PROVIDER_SITE_OTHER): Payer: Self-pay

## 2022-12-04 DIAGNOSIS — M25562 Pain in left knee: Secondary | ICD-10-CM | POA: Diagnosis not present

## 2022-12-04 DIAGNOSIS — G8929 Other chronic pain: Secondary | ICD-10-CM

## 2022-12-04 DIAGNOSIS — M1712 Unilateral primary osteoarthritis, left knee: Secondary | ICD-10-CM | POA: Diagnosis not present

## 2022-12-04 NOTE — Progress Notes (Signed)
The patient is well-known to [place].  She is 62 years old and we did replace her right knee in April 2023.  Her left knee has been now hurting significantly for the last 2 months with swelling as well.  It is actually been bothering her for over a year now.  Previous x-rays at the time of her right knee replacement showed significant medial joint space narrowing.  At this point her left knee pain is daily and it is detrimentally fighting her mobility, her quality of life and her actives of daily living.  She is not a diabetic.  She is otherwise healthy.  She stands all day and works on her feet.  That is making things worse for her.  She has been taking combination of Tylenol, gabapentin and tizanidine.  I was able to review all of her medications and past medical history within epic.  On exam today she is a thin individual.  She is not diabetic.  Her right total knee arthroplasty has full flexion and full extension.  There is no swelling with the right knee and it feels loosely stable.  Her left knee has a moderate joint effusion.  There is pain throughout the arc of motion of her left knee.  There is significant medial joint line tenderness and patellofemoral tenderness and crepitation.  There is varus malalignment that is correctable.  The knee is ligamentously stable.  Standing AP and lateral of the left knee also shows the right knee on the AP view.  The right total knee arthroplasty is in excellent position.  The left knee has significantly worsening arthritis when compared to previous films.  There is now near bone-on-bone wear of the medial compartment the knee with more varus malalignment.  There is also patellofemoral narrowing and osteophytes in all 3 compartments.  At this point we talked about knee replacement surgery for her.  Injections of steroid have not helped in the past and now with her worsening symptoms for over a year now, a knee replacement is warranted.  She has already tried quad  strengthening exercises and all the therapy that was shown to her by outpatient physical therapy for her right knee.  Further therapy is not warranted and further injections are not warranted given her clinical exam and x-ray findings of that left knee.  Will work on scheduling her sometime soon for a left knee replacement.  All questions and concerns were answered and addressed.

## 2022-12-11 ENCOUNTER — Ambulatory Visit: Payer: Medicaid Other | Admitting: Orthopaedic Surgery

## 2022-12-31 ENCOUNTER — Other Ambulatory Visit: Payer: Self-pay | Admitting: Physician Assistant

## 2023-01-05 NOTE — Progress Notes (Signed)
 Sent message, via epic in basket, requesting orders in epic from [facility].

## 2023-01-09 ENCOUNTER — Telehealth: Payer: Self-pay | Admitting: Orthopaedic Surgery

## 2023-01-09 ENCOUNTER — Other Ambulatory Visit: Payer: Self-pay

## 2023-01-09 NOTE — Telephone Encounter (Signed)
[employer] forms received. To [employer]. 

## 2023-01-09 NOTE — Progress Notes (Addendum)
 COVID Vaccine received:  [x]  No []  Yes Date of any COVID positive Test in last 90 days: no PCP - Doesn't have one Cardiologist - no  Chest x-ray -  EKG -  01/12/23 Epic Stress Test -  ECHO -  Cardiac Cath -   Bowel Prep - [x]  No  []   Yes ______  Pacemaker / ICD device [x]  No []  Yes   Spinal Cord Stimulator:[x]  No []  Yes       History of Sleep Apnea? [x]  No []  Yes   CPAP used?- [x]  No []  Yes    Does the patient monitor blood sugar?          [x]  No []  Yes  []  N/A  Patient has: [x]  NO Hx DM   []  Pre-DM                 []  DM1  []   DM2 Does patient have a [employer] or [employer]? []  No []  Yes   Fasting Blood Sugar Ranges-  Checks Blood Sugar _____ times a day  GLP1 agonist / usual dose - no GLP1 instructions:  SGLT-2 inhibitors / usual dose - no SGLT-2 instructions:   Blood Thinner / Instructions:no Aspirin  Instructions:no  Comments:   Activity level: Patient is able  to climb a flight of stairs without difficulty; [x]  No CP  [x]  No SOB,  ___   Patient can perform ADLs without assistance.   Anesthesia review: HTN, EKG, EKG change  Patient denies shortness of breath, fever, cough and chest pain at PAT appointment.  Patient verbalized understanding and agreement to the Pre-Surgical Instructions that were given to them at this PAT appointment. Patient was also educated of the need to review these PAT instructions again prior to his/her surgery.I reviewed the appropriate phone numbers to call if they have any and questions or concerns.

## 2023-01-09 NOTE — Patient Instructions (Signed)
 SURGICAL WAITING ROOM VISITATION  Patients having surgery or a procedure may have no more than 2 support people in the waiting area - these visitors may rotate.    Children under the age of 45 must have an adult with them who is not the patient.  Due to an increase in RSV and influenza rates and associated hospitalizations, children ages 70 and under may not visit patients in [facility] hospitals.  If the patient needs to stay at the hospital during part of their recovery, the visitor guidelines for inpatient rooms apply. Pre-op nurse will coordinate an appropriate time for 1 support person to accompany patient in pre-op.  This support person may not rotate.    Please refer to the [facility] website for the visitor guidelines for Inpatients (after your surgery is over and you are in a regular room).       Your procedure is scheduled on: 01/19/23   Report to [facility] Main Entrance    Report to admitting at  9 AM   Call this number if you have problems the morning of surgery [phone]   Do not eat food :After Midnight.   After Midnight you may have the following liquids until 8:15 AM DAY OF SURGERY  Water  Non-Citrus Juices (without pulp, NO RED-Apple, White grape, White cranberry) Black Coffee (NO MILK/CREAM OR CREAMERS, sugar ok)  Clear Tea (NO MILK/CREAM OR CREAMERS, sugar ok) regular and decaf                             Plain Jell-O (NO RED)                                           Fruit ices (not with fruit pulp, NO RED)                                     Popsicles (NO RED)                                                               Sports drinks like Gatorade (NO RED)                  The day of surgery:  Drink ONE (1) Pre-Surgery Clear Ensure at 8:15 AM the morning of surgery. Drink in one sitting. Do not sip.  This drink was given to you during your hospital  pre-op appointment visit. Nothing else to drink after completing the  Pre-Surgery Clear  Ensure     Oral Hygiene is also important to reduce your risk of infection.                                    Remember - BRUSH YOUR TEETH THE MORNING OF SURGERY WITH YOUR REGULAR TOOTHPASTE   Do NOT smoke after Midnight   Stop all vitamins and herbal supplements 7 days before surgery.   Take these medicines the morning of surgery with A SIP OF WATER :  Gabapentin              You may not have any metal on your body including hair pins, jewelry, and body piercing             Do not wear make-up, lotions, powders, perfumes/cologne, or deodorant  Do not wear nail polish including gel and S&S, artificial/acrylic nails, or any other type of covering on natural nails including finger and toenails. If you have artificial nails, gel coating, etc. that needs to be removed by a nail salon please have this removed prior to surgery or surgery may need to be canceled/ delayed if the surgeon/ anesthesia feels like they are unable to be safely monitored.   Do not shave  48 hours prior to surgery.               Men may shave face and neck.   Do not bring valuables to the hospital. [facility] IS NOT             RESPONSIBLE   FOR VALUABLES.   Contacts, glasses, dentures or bridgework may not be worn into surgery.   Bring small overnight bag day of surgery.   DO NOT BRING YOUR HOME MEDICATIONS TO THE HOSPITAL. PHARMACY WILL DISPENSE MEDICATIONS LISTED ON YOUR MEDICATION LIST TO YOU DURING YOUR ADMISSION IN THE HOSPITAL!    Patients discharged on the day of surgery will not be allowed to drive home.  Someone NEEDS to stay with you for the first 24 hours after anesthesia.   Special Instructions: Bring a copy of your healthcare power of attorney and living will documents the day of surgery if you haven't scanned them before.              Please read over the following fact sheets you were given: IF YOU HAVE QUESTIONS ABOUT YOUR PRE-OP INSTRUCTIONS PLEASE CALL [phone]   If you received a COVID  test during your pre-op visit  it is requested that you wear a mask when out in public, stay away from anyone that may not be feeling well and notify your surgeon if you develop symptoms. If you test positive for Covid or have been in contact with anyone that has tested positive in the last 10 days please notify you surgeon.      Pre-operative 5 CHG Bath Instructions   You can play a key role in reducing the risk of infection after surgery. Your skin needs to be as free of germs as possible. You can reduce the number of germs on your skin by washing with CHG (chlorhexidine  gluconate) soap before surgery. CHG is an antiseptic soap that kills germs and continues to kill germs even after washing.   DO NOT use if you have an allergy to chlorhexidine /CHG or antibacterial soaps. If your skin becomes reddened or irritated, stop using the CHG and notify one of our RNs at [phone].   Please shower with the CHG soap starting 4 days before surgery using the following schedule:     Please keep in mind the following:  DO NOT shave, including legs and underarms, starting the day of your first shower.   You may shave your face at any point before/day of surgery.  Place clean sheets on your bed the day you start using CHG soap. Use a clean washcloth (not used since being washed) for each shower. DO NOT sleep with pets once you start using the CHG.   CHG Shower Instructions:  If you choose  to wash your hair and private area, wash first with your normal shampoo/soap.  After you use shampoo/soap, rinse your hair and body thoroughly to remove shampoo/soap residue.  Turn the water  OFF and apply about 3 tablespoons (45 ml) of CHG soap to a CLEAN washcloth.  Apply CHG soap ONLY FROM YOUR NECK DOWN TO YOUR TOES (washing for 3-5 minutes)  DO NOT use CHG soap on face, private areas, open wounds, or sores.  Pay special attention to the area where your surgery is being performed.  If you are having back surgery,  having someone wash your back for you may be helpful. Wait 2 minutes after CHG soap is applied, then you may rinse off the CHG soap.  Pat dry with a clean towel  Put on clean clothes/pajamas   If you choose to wear lotion, please use ONLY the CHG-compatible lotions on the back of this paper.     Additional instructions for the day of surgery: DO NOT APPLY any lotions, deodorants, cologne, or perfumes.   Put on clean/comfortable clothes.  Brush your teeth.  Ask your nurse before applying any prescription medications to the skin.      CHG Compatible Lotions   Aveeno Moisturizing lotion  Cetaphil Moisturizing Cream  Cetaphil Moisturizing Lotion  Clairol Herbal Essence Moisturizing Lotion, Dry Skin  Clairol Herbal Essence Moisturizing Lotion, Extra Dry Skin  Clairol Herbal Essence Moisturizing Lotion, Normal Skin  Curel Age Defying Therapeutic Moisturizing Lotion with Alpha Hydroxy  Curel Extreme Care Body Lotion  Curel Soothing Hands Moisturizing Hand Lotion  Curel Therapeutic Moisturizing Cream, Fragrance-Free  Curel Therapeutic Moisturizing Lotion, Fragrance-Free  Curel Therapeutic Moisturizing Lotion, Original Formula  Eucerin Daily Replenishing Lotion  Eucerin Dry Skin Therapy Plus Alpha Hydroxy Crme  Eucerin Dry Skin Therapy Plus Alpha Hydroxy Lotion  Eucerin Original Crme  Eucerin Original Lotion  Eucerin Plus Crme Eucerin Plus Lotion  Eucerin TriLipid Replenishing Lotion  Keri Anti-Bacterial Hand Lotion  Keri Deep Conditioning Original Lotion Dry Skin Formula Softly Scented  Keri Deep Conditioning Original Lotion, Fragrance Free Sensitive Skin Formula  Keri Lotion Fast Absorbing Fragrance Free Sensitive Skin Formula  Keri Lotion Fast Absorbing Softly Scented Dry Skin Formula  Keri Original Lotion  Keri Skin Renewal Lotion Keri Silky Smooth Lotion  Keri Silky Smooth Sensitive Skin Lotion  Nivea Body Creamy Conditioning Oil  Nivea Body Extra Enriched Lotion  Nivea  Body Original Lotion  Nivea Body Sheer Moisturizing Lotion Nivea Crme  Nivea Skin Firming Lotion  NutraDerm 30 Skin Lotion  NutraDerm Skin Lotion  NutraDerm Therapeutic Skin Cream  NutraDerm Therapeutic Skin Lotion  ProShield Protective Hand Cream   Incentive Spirometer (Watch this video at home: [email or web address])  An incentive spirometer is a tool that can help keep your lungs clear and active. This tool measures how well you are filling your lungs with each breath. Taking long deep breaths may help reverse or decrease the chance of developing breathing (pulmonary) problems (especially infection) following: A long period of time when you are unable to move or be active. BEFORE THE PROCEDURE  If the spirometer includes an indicator to show your best effort, your nurse or respiratory therapist will set it to a desired goal. If possible, sit up straight or lean slightly forward. Try not to slouch. Hold the incentive spirometer in an upright position. INSTRUCTIONS FOR USE  Sit on the edge of your bed if possible, or sit up as far as you can in bed or on a chair. Hold the  incentive spirometer in an upright position. Breathe out normally. Place the mouthpiece in your mouth and seal your lips tightly around it. Breathe in slowly and as deeply as possible, raising the piston or the ball toward the top of the column. Hold your breath for 3-5 seconds or for as long as possible. Allow the piston or ball to fall to the bottom of the column. Remove the mouthpiece from your mouth and breathe out normally. Rest for a few seconds and repeat Steps 1 through 7 at least 10 times every 1-2 hours when you are awake. Take your time and take a few normal breaths between deep breaths. The spirometer may include an indicator to show your best effort. Use the indicator as a goal to work toward during each repetition. After each set of 10 deep breaths, practice coughing to be sure your  lungs are clear. If you have an incision (the cut made at the time of surgery), support your incision when coughing by placing a pillow or rolled up towels firmly against it. Once you are able to get out of bed, walk around indoors and cough well. You may stop using the incentive spirometer when instructed by your caregiver.  RISKS AND COMPLICATIONS Take your time so you do not get dizzy or light-headed. If you are in pain, you may need to take or ask for pain medication before doing incentive spirometry. It is harder to take a deep breath if you are having pain. AFTER USE Rest and breathe slowly and easily. It can be helpful to keep track of a log of your progress. Your caregiver can provide you with a simple table to help with this. If you are using the spirometer at home, follow these instructions: SEEK MEDICAL CARE IF:  You are having difficultly using the spirometer. You have trouble using the spirometer as often as instructed. Your pain medication is not giving enough relief while using the spirometer. You develop fever of 100.5 F (38.1 C) or higher. SEEK IMMEDIATE MEDICAL CARE IF:  You cough up bloody sputum that had not been present before. You develop fever of 102 F (38.9 C) or greater. You develop worsening pain at or near the incision site. MAKE SURE YOU:  Understand these instructions. Will watch your condition. Will get help right away if you are not doing well or get worse. Document Released: 05/01/2006 Document Revised: 03/13/2011 Document Reviewed: 07/02/2006 [facility] Patient Information 2014 [place], [place].

## 2023-01-12 ENCOUNTER — Other Ambulatory Visit: Payer: Self-pay

## 2023-01-12 ENCOUNTER — Encounter (HOSPITAL_COMMUNITY): Payer: Self-pay

## 2023-01-12 ENCOUNTER — Encounter (HOSPITAL_COMMUNITY)
Admission: RE | Admit: 2023-01-12 | Discharge: 2023-01-12 | Disposition: A | Discharge status: Home / Self Care | Payer: Medicaid Other | Source: Ambulatory Visit | Attending: Orthopaedic Surgery | Admitting: Orthopaedic Surgery

## 2023-01-12 VITALS — BP 171/100 | HR 72 | Temp 98.0°F | Resp 16 | Ht 66.0 in | Wt 173.0 lb

## 2023-01-12 DIAGNOSIS — M1712 Unilateral primary osteoarthritis, left knee: Secondary | ICD-10-CM | POA: Diagnosis not present

## 2023-01-12 DIAGNOSIS — Z01818 Encounter for other preprocedural examination: Secondary | ICD-10-CM | POA: Diagnosis present

## 2023-01-12 LAB — BASIC METABOLIC PANEL
Anion gap: 9 (ref 5–15)
BUN: 11 mg/dL (ref 8–23)
CO2: 21 mmol/L — ABNORMAL LOW (ref 22–32)
Calcium: 10.2 mg/dL (ref 8.9–10.3)
Chloride: 105 mmol/L (ref 98–111)
Creatinine, Ser: 0.58 mg/dL (ref 0.44–1.00)
GFR, Estimated: >60 mL/min (ref >60)
Glucose, Bld: 99 mg/dL (ref 70–99)
Potassium: 4.4 mmol/L (ref 3.5–5.1)
Sodium: 135 mmol/L (ref 135–145)

## 2023-01-12 LAB — CBC
HCT: 44.4 % (ref 36.0–46.0)
Hemoglobin: 14.9 g/dL (ref 12.0–15.0)
MCH: 34.6 pg — ABNORMAL HIGH (ref 26.0–34.0)
MCHC: 33.6 g/dL (ref 30.0–36.0)
MCV: 103 fL — ABNORMAL HIGH (ref 80.0–100.0)
Platelets: 135 10*3/uL — ABNORMAL LOW (ref 150–400)
RBC: 4.31 MIL/uL (ref 3.87–5.11)
RDW: 13.2 % (ref 11.5–15.5)
WBC: 5.8 10*3/uL (ref 4.0–10.5)
nRBC: 0 % (ref 0.0–0.2)

## 2023-01-12 LAB — SURGICAL PCR SCREEN
MRSA, PCR: NEGATIVE
Staphylococcus aureus: NEGATIVE

## 2023-01-14 ENCOUNTER — Other Ambulatory Visit: Payer: Self-pay | Admitting: Physician Assistant

## 2023-01-15 ENCOUNTER — Other Ambulatory Visit: Payer: Self-pay

## 2023-01-15 DIAGNOSIS — Z96652 Presence of left artificial knee joint: Secondary | ICD-10-CM

## 2023-01-18 NOTE — H&P (Signed)
TOTAL KNEE ADMISSION H&P  Patient is being admitted for left total knee arthroplasty.  Subjective:  Chief Complaint:left knee pain.  HPI: Tracy Mercado, 63 y.o. female, has a history of pain and functional disability in the left knee due to arthritis and has failed non-surgical conservative treatments for greater than 12 weeks to includeNSAID's and/or analgesics, corticosteriod injections, viscosupplementation injections, flexibility and strengthening excercises, and activity modification.  Onset of symptoms was gradual, starting 3 years ago with gradually worsening course since that time. The patient noted prior procedures on the knee to include  arthroscopy on the left knee(s).  Patient currently rates pain in the left knee(s) at 10 out of 10 with activity. Patient has night pain, worsening of pain with activity and weight bearing, pain that interferes with activities of daily living, pain with passive range of motion, crepitus, and joint swelling.  Patient has evidence of subchondral sclerosis, periarticular osteophytes, and joint space narrowing by imaging studies. There is no active infection.  Patient Active Problem List   Diagnosis Date Noted   Unilateral primary osteoarthritis, left knee 12/04/2022   Status post right knee replacement 04/05/2021   Past Medical History:  Diagnosis Date   Arthritis    CAD (coronary artery disease)    Hypertension     Past Surgical History:  Procedure Laterality Date   HAND SURGERY Left    KNEE ARTHROSCOPY     TOTAL KNEE ARTHROPLASTY Right 04/05/2021   Procedure: Right TOTAL KNEE ARTHROPLASTY;  Surgeon: Kathryne Hitch, MD;  Location: [facility] OR;  Service: Orthopedics;  Laterality: Right;    No current facility-administered medications for this encounter.   Current Outpatient Medications  Medication Sig Dispense Refill Last Dose/Taking   Ascorbic Acid (VITAMIN C PO) Take 1 tablet by mouth in the morning.   Taking   gabapentin (NEURONTIN)  300 MG capsule TAKE 1 CAPSULE(300 MG) BY MOUTH AT BEDTIME AS NEEDED 30 capsule 3 Taking   ibuprofen (ADVIL) 200 MG tablet Take 800 mg by mouth every 8 (eight) hours as needed (for pain.).   Taking As Needed   tiZANidine (ZANAFLEX) 4 MG tablet TAKE 1 TABLET(4 MG) BY MOUTH EVERY 8 HOURS AS NEEDED FOR MUSCLE SPASMS 60 tablet 1    Allergies  Allergen Reactions   Hydrocodone Itching    Social History   Tobacco Use   Smoking status: Every Day    Current packs/day: 0.50    Types: Cigarettes   Smokeless tobacco: Never  Substance Use Topics   Alcohol use: Yes    Comment: occasionally    Family History  Problem Relation Age of Onset   Stroke Mother    Healthy Father      Review of Systems  Objective:  Physical Exam Vitals reviewed.  Constitutional:      Appearance: Normal appearance. She is normal weight.  HENT:     Head: Normocephalic and atraumatic.  Eyes:     Extraocular Movements: Extraocular movements intact.     Pupils: Pupils are equal, round, and reactive to light.  Cardiovascular:     Rate and Rhythm: Normal rate and regular rhythm.     Pulses: Normal pulses.  Pulmonary:     Breath sounds: Normal breath sounds.  Abdominal:     Palpations: Abdomen is soft.  Musculoskeletal:     Cervical back: Normal range of motion and neck supple.     Left knee: Effusion, bony tenderness and crepitus present. Decreased range of motion. Tenderness present over the medial joint  line and lateral joint line. Abnormal alignment and abnormal meniscus.  Neurological:     Mental Status: She is alert and oriented to person, place, and time.  Psychiatric:        Behavior: Behavior normal.     Vital signs in last 24 hours:    Labs:   Estimated body mass index is 27.92 kg/m as calculated from the following:   Height as of 01/12/23: 5\' 6"  (1.676 m).   Weight as of 01/12/23: 78.5 kg.   Imaging Review Plain radiographs demonstrate severe degenerative joint disease of the left  knee(s). The overall alignment ismild varus. The bone quality appears to be good for age and reported activity level.      Assessment/Plan:  End stage arthritis, left knee   The patient history, physical examination, clinical judgment of the provider and imaging studies are consistent with end stage degenerative joint disease of the left knee(s) and total knee arthroplasty is deemed medically necessary. The treatment options including medical management, injection therapy arthroscopy and arthroplasty were discussed at length. The risks and benefits of total knee arthroplasty were presented and reviewed. The risks due to aseptic loosening, infection, stiffness, patella tracking problems, thromboembolic complications and other imponderables were discussed. The patient acknowledged the explanation, agreed to proceed with the plan and consent was signed. Patient is being admitted for inpatient treatment for surgery, pain control, PT, OT, prophylactic antibiotics, VTE prophylaxis, progressive ambulation and ADL's and discharge planning. The patient is planning to be discharged home with home health services

## 2023-01-19 ENCOUNTER — Observation Stay (HOSPITAL_COMMUNITY)
Admission: RE | Admit: 2023-01-19 | Discharge: 2023-01-20 | Disposition: A | Discharge status: Home / Self Care | Payer: Medicaid Other | Attending: Orthopaedic Surgery | Admitting: Orthopaedic Surgery

## 2023-01-19 ENCOUNTER — Other Ambulatory Visit: Payer: Self-pay

## 2023-01-19 ENCOUNTER — Ambulatory Visit (HOSPITAL_COMMUNITY): Payer: Medicaid Other | Admitting: Anesthesiology

## 2023-01-19 ENCOUNTER — Ambulatory Visit (HOSPITAL_COMMUNITY): Payer: Medicaid Other | Admitting: Physician Assistant

## 2023-01-19 ENCOUNTER — Encounter (HOSPITAL_COMMUNITY)
Admission: RE | Disposition: A | Discharge status: Home / Self Care | Payer: Self-pay | Source: Home / Self Care | Attending: Orthopaedic Surgery

## 2023-01-19 ENCOUNTER — Encounter (HOSPITAL_COMMUNITY): Payer: Self-pay | Admitting: Orthopaedic Surgery

## 2023-01-19 ENCOUNTER — Observation Stay (HOSPITAL_COMMUNITY): Payer: Medicaid Other

## 2023-01-19 DIAGNOSIS — I10 Essential (primary) hypertension: Secondary | ICD-10-CM | POA: Diagnosis not present

## 2023-01-19 DIAGNOSIS — Z79899 Other long term (current) drug therapy: Secondary | ICD-10-CM | POA: Diagnosis not present

## 2023-01-19 DIAGNOSIS — Z96651 Presence of right artificial knee joint: Secondary | ICD-10-CM | POA: Diagnosis not present

## 2023-01-19 DIAGNOSIS — M1712 Unilateral primary osteoarthritis, left knee: Secondary | ICD-10-CM | POA: Diagnosis present

## 2023-01-19 DIAGNOSIS — F17210 Nicotine dependence, cigarettes, uncomplicated: Secondary | ICD-10-CM | POA: Insufficient documentation

## 2023-01-19 DIAGNOSIS — I2510 Atherosclerotic heart disease of native coronary artery without angina pectoris: Secondary | ICD-10-CM | POA: Diagnosis not present

## 2023-01-19 DIAGNOSIS — Z96652 Presence of left artificial knee joint: Secondary | ICD-10-CM

## 2023-01-19 HISTORY — PX: TOTAL KNEE ARTHROPLASTY: SHX125

## 2023-01-19 SURGERY — ARTHROPLASTY, KNEE, TOTAL
Anesthesia: Spinal | Site: Knee | Laterality: Left

## 2023-01-19 MED ORDER — STERILE WATER FOR IRRIGATION IR SOLN
Status: DC | PRN
  Administered 2023-01-19: 1000 mL

## 2023-01-19 MED ORDER — PROPOFOL 500 MG/50ML IV EMUL
INTRAVENOUS | Status: DC | PRN
  Administered 2023-01-19: 175 ug/kg/min via INTRAVENOUS

## 2023-01-19 MED ORDER — DIPHENHYDRAMINE HCL 12.5 MG/5ML PO ELIX
12.5000 mg | ORAL_SOLUTION | ORAL | Status: DC | PRN
  Administered 2023-01-20: 25 mg via ORAL
  Filled 2023-01-19: qty 10

## 2023-01-19 MED ORDER — DEXAMETHASONE SODIUM PHOSPHATE 10 MG/ML IJ SOLN
INTRAMUSCULAR | Status: AC
  Filled 2023-01-19: qty 1

## 2023-01-19 MED ORDER — ONDANSETRON HCL 4 MG PO TABS
4.0000 mg | ORAL_TABLET | Freq: Four times a day (QID) | ORAL | Status: DC | PRN
Start: 2023-01-19 — End: 2023-01-20

## 2023-01-19 MED ORDER — MIDAZOLAM HCL 2 MG/2ML IJ SOLN
INTRAMUSCULAR | Status: DC | PRN
  Administered 2023-01-19 (×2): 1 mg via INTRAVENOUS
  Administered 2023-01-19: 2 mg via INTRAVENOUS

## 2023-01-19 MED ORDER — MIDAZOLAM HCL 2 MG/2ML IJ SOLN
INTRAMUSCULAR | Status: AC
Start: 2023-01-19 — End: ?
  Filled 2023-01-19: qty 2

## 2023-01-19 MED ORDER — CEFAZOLIN SODIUM-DEXTROSE 2-4 GM/100ML-% IV SOLN
2.0000 g | INTRAVENOUS | Status: AC
  Administered 2023-01-19: 2 g via INTRAVENOUS
  Filled 2023-01-19: qty 100

## 2023-01-19 MED ORDER — ALUM & MAG HYDROXIDE-SIMETH 200-200-20 MG/5ML PO SUSP: 30.0000 mL | ORAL | Status: DC | PRN

## 2023-01-19 MED ORDER — ONDANSETRON HCL 4 MG/2ML IJ SOLN
INTRAMUSCULAR | Status: AC
Start: 2023-01-19 — End: ?
  Filled 2023-01-19: qty 2

## 2023-01-19 MED ORDER — BUPIVACAINE IN DEXTROSE 0.75-8.25 % IT SOLN
INTRATHECAL | Status: DC | PRN
  Administered 2023-01-19: 1.8 mL via INTRATHECAL

## 2023-01-19 MED ORDER — ORAL CARE MOUTH RINSE: 15.0000 mL | Freq: Once | OROMUCOSAL | Status: AC

## 2023-01-19 MED ORDER — CHLORHEXIDINE GLUCONATE 0.12 % MT SOLN
15.0000 mL | Freq: Once | OROMUCOSAL | Status: AC
  Administered 2023-01-19: 15 mL via OROMUCOSAL

## 2023-01-19 MED ORDER — TIZANIDINE HCL 4 MG PO TABS
4.0000 mg | ORAL_TABLET | Freq: Four times a day (QID) | ORAL | Status: DC | PRN
  Administered 2023-01-20 (×2): 4 mg via ORAL
  Filled 2023-01-19 (×2): qty 1

## 2023-01-19 MED ORDER — ACETAMINOPHEN 10 MG/ML IV SOLN
INTRAVENOUS | Status: AC
  Filled 2023-01-19: qty 100

## 2023-01-19 MED ORDER — BUPIVACAINE-EPINEPHRINE 0.25% -1:200000 IJ SOLN
INTRAMUSCULAR | Status: DC | PRN
  Administered 2023-01-19: 30 mL

## 2023-01-19 MED ORDER — TRANEXAMIC ACID-NACL 1000-0.7 MG/100ML-% IV SOLN
1000.0000 mg | INTRAVENOUS | Status: AC
  Administered 2023-01-19: 1000 mg via INTRAVENOUS
  Filled 2023-01-19: qty 100

## 2023-01-19 MED ORDER — CEFAZOLIN SODIUM-DEXTROSE 2-4 GM/100ML-% IV SOLN
2.0000 g | Freq: Four times a day (QID) | INTRAVENOUS | Status: AC
  Administered 2023-01-19 – 2023-01-20 (×2): 2 g via INTRAVENOUS
  Filled 2023-01-19 (×2): qty 100

## 2023-01-19 MED ORDER — SODIUM CHLORIDE 0.9 % IV SOLN: INTRAVENOUS | Status: DC

## 2023-01-19 MED ORDER — BUPIVACAINE-EPINEPHRINE 0.25% -1:200000 IJ SOLN
INTRAMUSCULAR | Status: AC
  Filled 2023-01-19: qty 1

## 2023-01-19 MED ORDER — MIDAZOLAM HCL 2 MG/2ML IJ SOLN
1.0000 mg | INTRAMUSCULAR | Status: DC
  Administered 2023-01-19: 1 mg via INTRAVENOUS
  Filled 2023-01-19: qty 2

## 2023-01-19 MED ORDER — DOCUSATE SODIUM 100 MG PO CAPS
100.0000 mg | ORAL_CAPSULE | Freq: Two times a day (BID) | ORAL | Status: DC
  Administered 2023-01-19 – 2023-01-20 (×2): 100 mg via ORAL
  Filled 2023-01-19 (×2): qty 1

## 2023-01-19 MED ORDER — PROPOFOL 1000 MG/100ML IV EMUL
INTRAVENOUS | Status: AC
  Filled 2023-01-19: qty 100

## 2023-01-19 MED ORDER — BUPIVACAINE-EPINEPHRINE (PF) 0.5% -1:200000 IJ SOLN
INTRAMUSCULAR | Status: DC | PRN
  Administered 2023-01-19: 20 mL

## 2023-01-19 MED ORDER — PHENYLEPHRINE HCL-NACL 20-0.9 MG/250ML-% IV SOLN
INTRAVENOUS | Status: DC | PRN
  Administered 2023-01-19: 25 ug/min via INTRAVENOUS

## 2023-01-19 MED ORDER — METOCLOPRAMIDE HCL 5 MG PO TABS: 5.0000 mg | ORAL_TABLET | Freq: Three times a day (TID) | ORAL | Status: DC | PRN

## 2023-01-19 MED ORDER — MIDAZOLAM HCL 2 MG/2ML IJ SOLN
INTRAMUSCULAR | Status: AC
  Filled 2023-01-19: qty 2

## 2023-01-19 MED ORDER — 0.9 % SODIUM CHLORIDE (POUR BTL) OPTIME
TOPICAL | Status: DC | PRN
  Administered 2023-01-19: 1000 mL

## 2023-01-19 MED ORDER — LACTATED RINGERS IV SOLN: INTRAVENOUS | Status: DC

## 2023-01-19 MED ORDER — POVIDONE-IODINE 10 % EX SWAB: 2.0000 | Freq: Once | CUTANEOUS | Status: DC

## 2023-01-19 MED ORDER — ONDANSETRON HCL 4 MG/2ML IJ SOLN: 4.0000 mg | Freq: Four times a day (QID) | INTRAMUSCULAR | Status: DC | PRN

## 2023-01-19 MED ORDER — OXYCODONE HCL 5 MG PO TABS
5.0000 mg | ORAL_TABLET | Freq: Once | ORAL | Status: AC | PRN
  Administered 2023-01-19: 5 mg via ORAL

## 2023-01-19 MED ORDER — ONDANSETRON HCL 4 MG/2ML IJ SOLN: 4.0000 mg | Freq: Once | INTRAMUSCULAR | Status: DC | PRN

## 2023-01-19 MED ORDER — HYDROMORPHONE HCL 1 MG/ML IJ SOLN
0.5000 mg | INTRAMUSCULAR | Status: DC | PRN
  Administered 2023-01-19 (×2): 1 mg via INTRAVENOUS
  Filled 2023-01-19 (×2): qty 1

## 2023-01-19 MED ORDER — ONDANSETRON HCL 4 MG/2ML IJ SOLN
INTRAMUSCULAR | Status: DC | PRN
  Administered 2023-01-19: 4 mg via INTRAVENOUS

## 2023-01-19 MED ORDER — ACETAMINOPHEN 10 MG/ML IV SOLN
1000.0000 mg | Freq: Once | INTRAVENOUS | Status: DC | PRN
  Administered 2023-01-19: 1000 mg via INTRAVENOUS

## 2023-01-19 MED ORDER — SODIUM CHLORIDE 0.9 % IR SOLN
Status: DC | PRN
  Administered 2023-01-19: 1000 mL

## 2023-01-19 MED ORDER — FENTANYL CITRATE PF 50 MCG/ML IJ SOSY
50.0000 ug | PREFILLED_SYRINGE | INTRAMUSCULAR | Status: DC
Start: 2023-01-19 — End: 2023-01-19
  Administered 2023-01-19: 50 ug via INTRAVENOUS
  Filled 2023-01-19: qty 2

## 2023-01-19 MED ORDER — DEXMEDETOMIDINE HCL IN NACL 80 MCG/20ML IV SOLN
INTRAVENOUS | Status: DC | PRN
  Administered 2023-01-19: 12 ug via INTRAVENOUS
  Administered 2023-01-19: 8 ug via INTRAVENOUS

## 2023-01-19 MED ORDER — MENTHOL 3 MG MT LOZG
1.0000 | LOZENGE | OROMUCOSAL | Status: DC | PRN
Start: 2023-01-19 — End: 2023-01-20

## 2023-01-19 MED ORDER — PHENOL 1.4 % MT LIQD: 1.0000 | OROMUCOSAL | Status: DC | PRN

## 2023-01-19 MED ORDER — ASPIRIN 81 MG PO CHEW
81.0000 mg | CHEWABLE_TABLET | Freq: Two times a day (BID) | ORAL | Status: DC
  Administered 2023-01-19 – 2023-01-20 (×2): 81 mg via ORAL
  Filled 2023-01-19 (×2): qty 1

## 2023-01-19 MED ORDER — OXYCODONE HCL 5 MG PO TABS
5.0000 mg | ORAL_TABLET | ORAL | Status: DC | PRN
  Administered 2023-01-19: 5 mg via ORAL
  Administered 2023-01-20 (×2): 10 mg via ORAL
  Filled 2023-01-19 (×2): qty 2
  Filled 2023-01-19: qty 1

## 2023-01-19 MED ORDER — DEXAMETHASONE SODIUM PHOSPHATE 10 MG/ML IJ SOLN
INTRAMUSCULAR | Status: DC | PRN
  Administered 2023-01-19: 10 mg

## 2023-01-19 MED ORDER — OXYCODONE HCL 5 MG/5ML PO SOLN: 5.0000 mg | Freq: Once | ORAL | Status: AC | PRN

## 2023-01-19 MED ORDER — FENTANYL CITRATE PF 50 MCG/ML IJ SOSY: 25.0000 ug | PREFILLED_SYRINGE | INTRAMUSCULAR | Status: DC | PRN

## 2023-01-19 MED ORDER — METOCLOPRAMIDE HCL 5 MG/ML IJ SOLN: 5.0000 mg | Freq: Three times a day (TID) | INTRAMUSCULAR | Status: DC | PRN

## 2023-01-19 MED ORDER — PROPOFOL 10 MG/ML IV BOLUS
INTRAVENOUS | Status: DC | PRN
  Administered 2023-01-19: 50 mg via INTRAVENOUS
  Administered 2023-01-19: 20 mg via INTRAVENOUS
  Administered 2023-01-19: 30 mg via INTRAVENOUS

## 2023-01-19 MED ORDER — OXYCODONE HCL 5 MG PO TABS
10.0000 mg | ORAL_TABLET | ORAL | Status: DC | PRN
Start: 2023-01-19 — End: 2023-01-20
  Administered 2023-01-20: 15 mg via ORAL
  Filled 2023-01-19: qty 3

## 2023-01-19 MED ORDER — OXYCODONE HCL 5 MG PO TABS
ORAL_TABLET | ORAL | Status: AC
  Filled 2023-01-19: qty 1

## 2023-01-19 MED ORDER — ACETAMINOPHEN 325 MG PO TABS
325.0000 mg | ORAL_TABLET | Freq: Four times a day (QID) | ORAL | Status: DC | PRN
  Administered 2023-01-20: 325 mg via ORAL
  Filled 2023-01-19: qty 2

## 2023-01-19 MED ORDER — DEXAMETHASONE SODIUM PHOSPHATE 10 MG/ML IJ SOLN
INTRAMUSCULAR | Status: DC | PRN
  Administered 2023-01-19: 10 mg via INTRAVENOUS

## 2023-01-19 MED ORDER — PANTOPRAZOLE SODIUM 40 MG PO TBEC
40.0000 mg | DELAYED_RELEASE_TABLET | Freq: Every day | ORAL | Status: DC
  Administered 2023-01-20: 40 mg via ORAL
  Filled 2023-01-19: qty 1

## 2023-01-19 SURGICAL SUPPLY — 51 items
BAG COUNTER SPONGE SURGICOUNT (BAG) IMPLANT
BAG ZIPLOCK 12X15 (MISCELLANEOUS) ×2 IMPLANT
BENZOIN TINCTURE PRP APPL 2/3 (GAUZE/BANDAGES/DRESSINGS) IMPLANT
BLADE SAG 18X100X1.27 (BLADE) ×2 IMPLANT
BLADE SURG SZ10 CARB STEEL (BLADE) ×4 IMPLANT
BNDG ELASTIC 6INX 5YD STR LF (GAUZE/BANDAGES/DRESSINGS) ×4 IMPLANT
BOWL SMART MIX CTS (DISPOSABLE) IMPLANT
CEMENT BONE R 1X40 (Cement) IMPLANT
COMP FEM CEMT PERSONA SZ9 LT (Knees) ×1 IMPLANT
COMP TIB PS KNEE E 0D LT (Joint) ×1 IMPLANT
COMPONENT FEM CEMT PRNSA SZ9LT (Knees) IMPLANT
COMPONET TIB PS KNEE E 0D LT (Joint) IMPLANT
COOLER ICEMAN CLASSIC (MISCELLANEOUS) ×2 IMPLANT
COVER SURGICAL LIGHT HANDLE (MISCELLANEOUS) ×2 IMPLANT
CUFF TRNQT CYL 34X4.125X (TOURNIQUET CUFF) ×2 IMPLANT
DRAPE INCISE IOBAN 66X45 STRL (DRAPES) ×2 IMPLANT
DRAPE U-SHAPE 47X51 STRL (DRAPES) ×2 IMPLANT
DURAPREP 26ML APPLICATOR (WOUND CARE) ×2 IMPLANT
ELECT BLADE TIP CTD 4 INCH (ELECTRODE) ×2 IMPLANT
ELECT REM PT RETURN 15FT ADLT (MISCELLANEOUS) ×2 IMPLANT
GAUZE PAD ABD 8X10 STRL (GAUZE/BANDAGES/DRESSINGS) ×4 IMPLANT
GAUZE SPONGE 4X4 12PLY STRL (GAUZE/BANDAGES/DRESSINGS) ×2 IMPLANT
GAUZE XEROFORM 1X8 LF (GAUZE/BANDAGES/DRESSINGS) IMPLANT
GLOVE BIO SURGEON STRL SZ7.5 (GLOVE) ×2 IMPLANT
GLOVE BIOGEL PI IND STRL 8 (GLOVE) ×4 IMPLANT
GLOVE ECLIPSE 8.0 STRL XLNG CF (GLOVE) ×2 IMPLANT
GOWN STRL REUS W/ TWL XL LVL3 (GOWN DISPOSABLE) ×4 IMPLANT
HOLDER FOLEY CATH W/STRAP (MISCELLANEOUS) IMPLANT
IMMOBILIZER KNEE 20 (SOFTGOODS) ×1
IMMOBILIZER KNEE 20 THIGH 36 (SOFTGOODS) ×2 IMPLANT
KIT TURNOVER KIT A (KITS) IMPLANT
NS IRRIG 1000ML POUR BTL (IV SOLUTION) ×2 IMPLANT
PACK TOTAL KNEE CUSTOM (KITS) ×2 IMPLANT
PAD COLD SHLDR WRAP-ON (PAD) ×2 IMPLANT
PADDING CAST COTTON 6X4 STRL (CAST SUPPLIES) ×4 IMPLANT
PIN DRILL HDLS TROCAR 75 4PK (PIN) IMPLANT
PROTECTOR NERVE ULNAR (MISCELLANEOUS) ×2 IMPLANT
SCREW FEMALE HEX FIX 25X2.5 (ORTHOPEDIC DISPOSABLE SUPPLIES) IMPLANT
SET HNDPC FAN SPRY TIP SCT (DISPOSABLE) ×2 IMPLANT
SET PAD KNEE POSITIONER (MISCELLANEOUS) ×2 IMPLANT
SPIKE FLUID TRANSFER (MISCELLANEOUS) IMPLANT
STAPLER SKIN PROX WIDE 3.9 (STAPLE) IMPLANT
STEM ARTISURF EF 12 SZ8-11 (Stem) IMPLANT
STEM POLY PAT PLY 32M KNEE (Knees) IMPLANT
STRIP CLOSURE SKIN 1/2X4 (GAUZE/BANDAGES/DRESSINGS) IMPLANT
SUT MNCRL AB 4-0 PS2 18 (SUTURE) IMPLANT
SUT VIC AB 0 CT1 27XBRD ANTBC (SUTURE) ×2 IMPLANT
SUT VIC AB 1 CT1 36 (SUTURE) ×4 IMPLANT
SUT VIC AB 2-0 CT1 TAPERPNT 27 (SUTURE) ×4 IMPLANT
TRAY FOLEY MTR SLVR 16FR STAT (SET/KITS/TRAYS/PACK) IMPLANT
WATER STERILE IRR 1000ML POUR (IV SOLUTION) ×4 IMPLANT

## 2023-01-19 NOTE — Op Note (Signed)
Operative Note  Date of operation: 01/19/2023 Preoperative diagnosis: Left knee primary osteoarthritis Postoperative diagnosis: Same  Procedure: Left cemented primary total knee arthroplasty  Implants: Biomet/Zimmer persona knee system Implant Name Type Inv. Item Serial No. Manufacturer Lot No. LRB No. Used Action  CEMENT BONE R 1X40 - [id number] Cement CEMENT BONE R 1X40  ZIMMER RECON(ORTH,TRAU,BIO,SG) [id number] Left 2 Implanted  COMP FEM CEMT PERSONA SZ9 LT - [id number] Knees COMP FEM CEMT PERSONA SZ9 LT  ZIMMER RECON(ORTH,TRAU,BIO,SG) [id number] Left 1 Implanted  STEM POLY PAT PLY 65M KNEE - [id number] Knees STEM POLY PAT PLY 65M KNEE  ZIMMER RECON(ORTH,TRAU,BIO,SG) [id number] Left 1 Implanted  COMP TIB PS KNEE E 0D LT - [id number] Joint COMP TIB PS KNEE E 0D LT  ZIMMER RECON(ORTH,TRAU,BIO,SG) [id number] Left 1 Implanted  STEM ARTISURF EF 12 SZ8-11 - [id number] Stem STEM ARTISURF EF 12 SZ8-11  ZIMMER RECON(ORTH,TRAU,BIO,SG) [id number] Left 1 Implanted   Surgeon: Vanita Panda. Magnus Ivan, MD Assistant: Rexene Edison, PA-C  Anesthesia: #1 left lower extremity adductor canal block, #2 spinal, #3 local Tourniquet time: Under 1 hour EBL: Less than 100 cc Antibiotics: IV Ancef Complications: None  Indications: The patient is a 63 year old female with known debilitating arthritis involving her left knee.  We have actually replaced her right knee in 2023 and that is done well.  At this point her right knee pain is daily and it is definitely affecting her mobility, her quality of life and her actives daily living.  Her x-rays also show severe and stage arthritis of her left knee.  Having had this before her right side she is fully aware of the risks of acute blood loss anemia, nerve or vessel injury, fracture, infection, DVT, implant failure, and wound healing issues.  She understands that our goals are hopefully decreased pain, improved mobility and hopefully improved quality of life  Procedure  description: After informed consent was obtained and the appropriate left knee was marked, anesthesia obtained a left lower extremity adductor canal block in the holding room.  The patient was then brought to the operating room and set up on the operating table where spinal anesthesia was obtained.  She was laid in supine position on the operating table and a Foley catheter was placed.  A nonsterile tractors placed around her upper left thigh and left thigh, knee, leg and ankle were prepped and draped with DuraPrep and sterile drapes including a sterile stockinette.  A timeout was called and she was identified as the correct patient the correct left knee.  An Esmarch was used to wrap out the leg and the tourniquet was flighted 300 mm of pressure.  With the knee extended we then made a direct midline incision over the patella and carried this proximally distally.  Dissection was carried down to the knee joint and a medial parapatellar arthrotomy was made finding a large joint effusion and significant arthritis in all 3 compartments of her knee.  Osteophytes removed from all 3 compartments as well as the ACL and remnants of the medial lateral meniscus.  With the knee in a flexed position we put our extramedullary cutting guide for making her proximal tibia cut for a 7 degree slope and correction for varus and valgus and taking 2 mm off the low side.  We did backed this down to more millimeters.  We made this cut without difficulty.  We then used a intramedullary guide for distal femur cut setting this for a left knee at 5 degrees externally rotated and a 10 mm  distal femoral cut.  We made that cut without difficulty and brought the knee back down to full extension and had achieved full extension with a 10 mm extension block.  We then injected the femur and put a femoral sizing guide based off the epicondylar axis.  Based off of this we chose a size 9 femur.  We put a 4-in-1 cutting block for a size 9 femur and made  our anterior and posterior cuts followed our chamfer cuts.  We then backed the tibia and chose a size E left tibial tray for coverage over the tibial plateau setting the rotation of the tibial tubercle and the femur.  We did our drill punch and kill hold off this.  We then trialed our size E left tibia followed by our size 9 standard CR left femur.  We placed a 10 mm medial congruent polythene insert and went up to a 12 mm thickness trial insert.  We are pleased with range of motion and stability without insert.  We then made a patella cut and drilled 3 holes for a size 32 patella button.  Again with all trial instrumentation the knee we are pleased the range of motion and stability.  We then removed all trial instrumentation with the knee and irrigated knee with normal saline solution.  We then placed our Marcaine with epinephrine around the arthrotomy.  Next the cement was mixed in with the knee in a flexed position we cemented our Biomet Zimmer persona tibial tray for a left knee size E followed by cementing our size 9 left CR standard femur.  We placed our 12 mm medial congruent left polythene insert and cemented our size 32 patella button.  We then held the knee fully compressed and extended to allow the cement to harden.  Once it hardened we let the tourniquet down and hemostasis was obtained with electrocautery.  The arthrotomy was then closed with interrupted #1 Vicryl suture followed by 0 Vicryl to close the deep tissue and 2-0 Vicryl to close subcutaneous tissue.  The skin was closed with staples.  Well-padded sterile dressings applied.  The patient was taken to the recovery room in stable condition.  Clark PA-C did assist during the entire case and beginning to end and his assistance was crucial and medically necessary for soft tissue management and retraction, helping guide implant placement and a layered closure of the wound.

## 2023-01-19 NOTE — Transfer of Care (Signed)
Immediate Anesthesia Transfer of Care Note  Patient: Tracy Mercado  Procedure(s) Performed: LEFT TOTAL KNEE ARTHROPLASTY (Left: Knee)  Patient Location: PACU  Anesthesia Type:Spinal  Level of Consciousness: awake, alert , and oriented  Airway & Oxygen Therapy: Patient Spontanous Breathing and Patient connected to face mask oxygen  Post-op Assessment: Report given to RN and Post -op Vital signs reviewed and stable  Post vital signs: Reviewed and stable  Last Vitals:  Vitals Value Taken Time  BP 119/47 01/19/23 1309  Temp    Pulse 78 01/19/23 1310  Resp 17 01/19/23 1310  SpO2 100 % 01/19/23 1310  Vitals shown include unfiled device data.  Last Pain:  Vitals:   01/19/23 1035  TempSrc:   PainSc: 5          Complications: No notable events documented.

## 2023-01-19 NOTE — Anesthesia Procedure Notes (Signed)
Anesthesia Regional Block: Adductor canal block   Pre-Anesthetic Checklist: , timeout performed,  Correct Patient, Correct Site, Correct Laterality,  Correct Procedure, Correct Position, site marked,  Risks and benefits discussed,  Surgical consent,  Pre-op evaluation,  At surgeon's request and post-op pain management  Laterality: Left  Prep: Maximum Sterile Barrier Precautions used, chloraprep       Needles:  Injection technique: Single-shot  Needle Type: Echogenic Needle      Needle Gauge: 20     Additional Needles:   Procedures:,,,, ultrasound used (permanent image in chart),,    Narrative:  Start time: 01/19/2023 10:20 AM End time: 01/19/2023 10:25 AM Injection made incrementally with aspirations every 5 mL.  Performed by: Personally  Anesthesiologist: Mariann Barter, MD

## 2023-01-19 NOTE — Anesthesia Postprocedure Evaluation (Signed)
Anesthesia Post Note  Patient: Tracy Mercado  Procedure(s) Performed: LEFT TOTAL KNEE ARTHROPLASTY (Left: Knee)     Patient location during evaluation: PACU Anesthesia Type: Spinal Level of consciousness: awake and alert Pain management: pain level controlled Vital Signs Assessment: post-procedure vital signs reviewed and stable Respiratory status: spontaneous breathing, nonlabored ventilation, respiratory function stable and patient connected to nasal cannula oxygen Cardiovascular status: stable and blood pressure returned to baseline Postop Assessment: no apparent nausea or vomiting Anesthetic complications: no   No notable events documented.  Last Vitals:  Vitals:   01/19/23 1430 01/19/23 1445  BP: (!) 135/105 (!) 144/92  Pulse: 74 62  Resp: 20 12  Temp:  36.5 C  SpO2: 95% 95%    Last Pain:  Vitals:   01/19/23 1430  TempSrc:   PainSc: 4                  Mariann Barter

## 2023-01-19 NOTE — Anesthesia Procedure Notes (Signed)
Spinal  Patient location during procedure: OR Start time: 01/19/2023 11:27 AM End time: 01/19/2023 11:30 AM Reason for block: surgical anesthesia Staffing Performed: resident/CRNA  Resident/CRNA: [place], Demetrick Eichenberger C, CRNA Performed by: [place], Octivia Canion C, CRNA Authorized by: Mariann Barter, MD   Preanesthetic Checklist Completed: patient identified, IV checked, site marked, risks and benefits discussed, surgical consent, monitors and equipment checked, pre-op evaluation and timeout performed Spinal Block Patient position: sitting Prep: DuraPrep and site prepped and draped Patient monitoring: heart rate, cardiac monitor, continuous pulse ox and blood pressure Approach: midline Location: L3-4 Injection technique: single-shot Needle Needle type: Pencan  Needle gauge: 24 G Needle length: 9 cm Assessment Sensory level: T4 Events: CSF return Additional Notes IV functioning, monitors applied to pt. Expiration date of kit checked and confirmed to be in date. Sterile prep and drape, hand hygiene and sterile gloved used. Pt was positioned and spine was prepped in sterile fashion. Skin was anesthetized with lidocaine. Free flow of clear CSF obtained prior to injecting local anesthetic into CSF x 1 attempt. Spinal needle aspirated freely following injection. Needle was carefully withdrawn, and pt tolerated procedure well. Loss of motor and sensory on exam post injection.

## 2023-01-19 NOTE — Evaluation (Signed)
Physical Therapy Evaluation Patient Details Name: Tracy Mercado MRN: [id number] DOB: 10-07-1960 Today's Date: 01/19/2023  History of Present Illness  63 yo female presents to therapy s/p L TKA on 01/19/2023 due to failure of conservative measures. Pt PMH includes but is not limited to: CAD, HTN, R TKA (2023) tobacco abuse and arthritis.  Clinical Impression   Tracy Mercado is a 63 y.o. female POD 0 s/p L TKA. Patient reports IND with mobility at baseline. Patient is now limited by functional impairments (see PT problem list below) and requires S for bed mobility and CGA and cues for transfers. Patient was able to ambulate 70 feet with RW and CGA level of assist. Patient instructed in exercise to facilitate ROM and circulation to manage edema. Patient will benefit from continued skilled PT interventions to address impairments and progress towards PLOF. Acute PT will follow to progress mobility and stair training in preparation for safe discharge home with family support and [facility] services.          If plan is discharge home, recommend the following: A little help with walking and/or transfers;A little help with bathing/dressing/bathroom;Assistance with cooking/housework;Assist for transportation;Help with stairs or ramp for entrance   Can travel by private vehicle        Equipment Recommendations None recommended by PT  Recommendations for Other Services       Functional Status Assessment Patient has had a recent decline in their functional status and demonstrates the ability to make significant improvements in function in a reasonable and predictable amount of time.     Precautions / Restrictions Precautions Precautions: Knee;Fall Restrictions Weight Bearing Restrictions Per Provider Order: No LLE Weight Bearing Per Provider Order: Weight bearing as tolerated      Mobility  Bed Mobility Overal bed mobility: Needs Assistance Bed Mobility: Supine to Sit     Supine to sit:  Supervision, HOB elevated, Used rails     General bed mobility comments: min cues    Transfers Overall transfer level: Needs assistance Equipment used: Rolling walker (2 wheels) Transfers: Sit to/from Stand Sit to Stand: Contact guard assist           General transfer comment: min cues for safety and proper AD placement    Ambulation/Gait Ambulation/Gait assistance: Contact guard assist Gait Distance (Feet): 70 Feet Assistive device: Rolling walker (2 wheels) Gait Pattern/deviations: Step-to pattern, Antalgic, Trunk flexed Gait velocity: decreased     General Gait Details: slight trunk flexion, B UE support at RW to offload L LE in stance phase, min cues pt and spouse ed provded on limited gait distances PPOD0 and verbalized understanding.  Stairs            Wheelchair Mobility     Tilt Bed    Modified Rankin (Stroke Patients Only)       Balance Overall balance assessment: Needs assistance Sitting-balance support: Feet supported Sitting balance-Leahy Scale: Good     Standing balance support: Bilateral upper extremity supported, During functional activity, Reliant on assistive device for balance Standing balance-Leahy Scale: Poor                               Pertinent Vitals/Pain Pain Assessment Pain Assessment: 0-10 Pain Score: 8  Pain Location: L knee and LE Pain Descriptors / Indicators: Aching, Burning, Discomfort, Dull, Grimacing, Operative site guarding Pain Intervention(s): Limited activity within patient's tolerance, Monitored during session, Premedicated before session, Repositioned, Patient requesting pain  meds-RN notified, RN gave pain meds during session, Ice applied    Home Living Family/patient expects to be discharged to:: Private residence Living Arrangements: Spouse/significant other Available Help at Discharge: Family Type of Home: Apartment Home Access: Stairs to enter Entrance Stairs-Rails: Conservation officer, historic buildings of Steps: flight   Home Layout: One level Home Equipment: Agricultural consultant (2 wheels) Additional Comments: iceman machine    Prior Function Prior Level of Function : Independent/Modified Independent;Driving;Working/employed             Mobility Comments: IND no AD for all ADLs, self care tasks and IADLs.       Extremity/Trunk Assessment        Lower Extremity Assessment Lower Extremity Assessment: LLE deficits/detail LLE Deficits / Details: ankle DF/PF 5/5; SLR < 10 degree lag LLE Sensation: WNL    Cervical / Trunk Assessment Cervical / Trunk Assessment: Normal  Communication   Communication Communication: No apparent difficulties  Cognition Arousal: Alert Behavior During Therapy: WFL for tasks assessed/performed Overall Cognitive Status: Within Functional Limits for tasks assessed                                          General Comments      Exercises Total Joint Exercises Ankle Circles/Pumps: AROM, Both, 10 reps   Assessment/Plan    PT Assessment Patient needs continued PT services  PT Problem List Decreased strength;Decreased range of motion;Decreased activity tolerance;Decreased balance;Decreased mobility;Decreased coordination;Pain       PT Treatment Interventions DME instruction;Gait training;Stair training;Functional mobility training;Therapeutic activities;Therapeutic exercise;Balance training;Neuromuscular re-education;Patient/family education;Modalities    PT Goals (Current goals can be found in the Care Plan section)  Acute Rehab PT Goals Patient Stated Goal: to be able to get home, get back to life and no pain PT Goal Formulation: With patient Time For Goal Achievement: 02/02/23 Potential to Achieve Goals: Good    Frequency 7X/week     Co-evaluation               AM-PAC PT "6 Clicks" Mobility  Outcome Measure Help needed turning from your back to your side while in a flat bed without using bedrails?:  A Little Help needed moving from lying on your back to sitting on the side of a flat bed without using bedrails?: A Little Help needed moving to and from a bed to a chair (including a wheelchair)?: A Little Help needed standing up from a chair using your arms (e.g., wheelchair or bedside chair)?: A Little Help needed to walk in hospital room?: A Little Help needed climbing 3-5 steps with a railing? : A Lot 6 Click Score: 17    End of Session Equipment Utilized During Treatment: Gait belt Activity Tolerance: Patient tolerated treatment well;No increased pain Patient left: in chair;with call bell/phone within reach;with chair alarm set;with family/visitor present Nurse Communication: Mobility status PT Visit Diagnosis: Unsteadiness on feet (R26.81);Other abnormalities of gait and mobility (R26.89);Muscle weakness (generalized) (M62.81);Difficulty in walking, not elsewhere classified (R26.2);Pain Pain - Right/Left: Left Pain - part of body: Knee;Leg    Time: 1700-1727 PT Time Calculation (min) (ACUTE ONLY): 27 min   Charges:   PT Evaluation $PT Eval Low Complexity: 1 Low PT Treatments $Gait Training: 8-22 mins PT General Charges $$ ACUTE PT VISIT: 1 Visit         Johnny Bridge, PT Acute Rehab   Jacqualyn Posey 01/19/2023, 5:56 PM

## 2023-01-19 NOTE — Anesthesia Preprocedure Evaluation (Addendum)
Anesthesia Evaluation  Patient identified by MRN, date of birth, ID band  Reviewed: Allergy & Precautions, NPO status , Patient's Chart, lab work & pertinent test results, reviewed documented beta blocker date and time   History of Anesthesia Complications Negative for: history of anesthetic complications  Airway Mallampati: III   Neck ROM: Full    Dental  (+) Missing   Pulmonary neg COPD, Current Smoker   breath sounds clear to auscultation       Cardiovascular hypertension, (-) angina + CAD  (-) Past MI, (-) Cardiac Stents, (-) CABG and (-) CHF  Rhythm:Regular Rate:Normal     Neuro/Psych neg Seizures    GI/Hepatic ,neg GERD  ,,(+) neg Cirrhosis        Endo/Other    Renal/GU Renal disease     Musculoskeletal  (+) Arthritis ,    Abdominal   Peds  Hematology  (+) Blood dyscrasia Thrombocytopenia - >100k   Anesthesia Other Findings   Reproductive/Obstetrics                              Anesthesia Physical Anesthesia Plan  ASA: 2  Anesthesia Plan: Spinal   Post-op Pain Management: Regional block*   Induction: Intravenous  PONV Risk Score and Plan: 1 and Ondansetron and Propofol infusion  Airway Management Planned:   Additional Equipment:   Intra-op Plan:   Post-operative Plan:   Informed Consent: I have reviewed the patients History and Physical, chart, labs and discussed the procedure including the risks, benefits and alternatives for the proposed anesthesia with the patient or authorized representative who has indicated his/her understanding and acceptance.     Dental advisory given  Plan Discussed with: CRNA  Anesthesia Plan Comments:          Anesthesia Quick Evaluation

## 2023-01-20 DIAGNOSIS — M1712 Unilateral primary osteoarthritis, left knee: Secondary | ICD-10-CM | POA: Diagnosis not present

## 2023-01-20 LAB — BASIC METABOLIC PANEL
Anion gap: <3 — ABNORMAL LOW (ref 5–15)
BUN: 12 mg/dL (ref 8–23)
CO2: 29 mmol/L (ref 22–32)
Calcium: 9 mg/dL (ref 8.9–10.3)
Chloride: 98 mmol/L (ref 98–111)
Creatinine, Ser: 0.7 mg/dL (ref 0.44–1.00)
GFR, Estimated: >60 mL/min (ref >60)
Glucose, Bld: 185 mg/dL — ABNORMAL HIGH (ref 70–99)
Potassium: 5.7 mmol/L — ABNORMAL HIGH (ref 3.5–5.1)
Sodium: 128 mmol/L — ABNORMAL LOW (ref 135–145)

## 2023-01-20 LAB — CBC
HCT: 32.4 % — ABNORMAL LOW (ref 36.0–46.0)
Hemoglobin: 10.5 g/dL — ABNORMAL LOW (ref 12.0–15.0)
MCH: 34.5 pg — ABNORMAL HIGH (ref 26.0–34.0)
MCHC: 32.4 g/dL (ref 30.0–36.0)
MCV: 106.6 fL — ABNORMAL HIGH (ref 80.0–100.0)
Platelets: 100 10*3/uL — ABNORMAL LOW (ref 150–400)
RBC: 3.04 MIL/uL — ABNORMAL LOW (ref 3.87–5.11)
RDW: 13.2 % (ref 11.5–15.5)
WBC: 10.4 10*3/uL (ref 4.0–10.5)
nRBC: 0 % (ref 0.0–0.2)

## 2023-01-20 MED ORDER — TIZANIDINE HCL 4 MG PO TABS: 4.0000 mg | ORAL_TABLET | Freq: Four times a day (QID) | ORAL | 0 refills | Status: DC | PRN

## 2023-01-20 MED ORDER — ASPIRIN 81 MG PO CHEW: 81.0000 mg | CHEWABLE_TABLET | Freq: Two times a day (BID) | ORAL | 0 refills | Status: DC

## 2023-01-20 MED ORDER — OXYCODONE HCL 5 MG PO TABS: 5.0000 mg | ORAL_TABLET | Freq: Four times a day (QID) | ORAL | 0 refills | Status: DC | PRN

## 2023-01-20 NOTE — Discharge Summary (Signed)
Patient ID: Tracy Mercado MRN: [id number] DOB/AGE: 05-25-1960 63 y.o.  Admit date: 01/19/2023 Discharge date: 01/20/2023  Admission Diagnoses:  Principal Problem:   Unilateral primary osteoarthritis, left knee Active Problems:   Status post total left knee replacement   Discharge Diagnoses:  Same  Past Medical History:  Diagnosis Date   Arthritis    CAD (coronary artery disease)    Hypertension     Surgeries: Procedure(s): LEFT TOTAL KNEE ARTHROPLASTY on 01/19/2023   Consultants:   Discharged Condition: Improved  Hospital Course: Tracy Mercado is an 63 y.o. female who was admitted 01/19/2023 for operative treatment ofUnilateral primary osteoarthritis, left knee. Patient has severe unremitting pain that affects sleep, daily activities, and work/hobbies. After pre-op clearance the patient was taken to the operating room on 01/19/2023 and underwent  Procedure(s): LEFT TOTAL KNEE ARTHROPLASTY.    Patient was given perioperative antibiotics:  Anti-infectives (From admission, onward)    Start     Dose/Rate Route Frequency Ordered Stop   01/19/23 1800  ceFAZolin (ANCEF) IVPB 2g/100 mL premix        2 g 200 mL/hr over 30 Minutes Intravenous Every 6 hours 01/19/23 1542 01/20/23 0040   01/19/23 0900  ceFAZolin (ANCEF) IVPB 2g/100 mL premix        2 g 200 mL/hr over 30 Minutes Intravenous On call to O.R. 01/19/23 0857 01/19/23 1133        Patient was given sequential compression devices, early ambulation, and chemoprophylaxis to prevent DVT.  Patient benefited maximally from hospital stay and there were no complications.    Recent vital signs: Patient Vitals for the past 24 hrs:  BP Temp Temp src Pulse Resp SpO2  01/20/23 0909 (!) 129/90 97.7 F (36.5 C) Oral 75 17 (!) 89 %  01/20/23 0610 120/81 97.9 F (36.6 C) -- 78 17 96 %  01/20/23 0203 117/79 97.7 F (36.5 C) Oral 72 16 94 %  01/19/23 2200 (!) 152/92 -- -- 82 -- --  01/19/23 2100 -- (!) 97.5 F (36.4 C) Oral  77 16 100 %  01/19/23 1756 (!) 147/97 97.9 F (36.6 C) -- 72 16 94 %  01/19/23 1545 (!) 156/111 98 F (36.7 C) -- 64 16 94 %  01/19/23 1515 (!) 140/105 -- -- 69 14 95 %  01/19/23 1500 (!) 137/94 -- -- 67 11 94 %  01/19/23 1445 (!) 144/92 97.7 F (36.5 C) -- 62 12 95 %  01/19/23 1430 (!) 135/105 -- -- 74 20 95 %  01/19/23 1400 126/80 -- -- 67 12 93 %  01/19/23 1345 122/81 -- -- 76 14 94 %  01/19/23 1330 118/67 -- -- 68 17 93 %  01/19/23 1315 126/73 -- -- 71 13 96 %  01/19/23 1309 (!) 119/47 98.6 F (37 C) -- 79 20 100 %     Recent laboratory studies:  Recent Labs    01/20/23 0333  WBC 10.4  HGB 10.5*  HCT 32.4*  PLT 100*  NA 128*  K 5.7*  CL 98  CO2 29  BUN 12  CREATININE 0.70  GLUCOSE 185*  CALCIUM 9.0     Discharge Medications:   Allergies as of 01/20/2023       Reactions   Hydrocodone Itching        Medication List     TAKE these medications    aspirin 81 MG chewable tablet Chew 1 tablet (81 mg total) by mouth 2 (two) times daily.   gabapentin 300 MG  capsule Commonly known as: NEURONTIN TAKE 1 CAPSULE(300 MG) BY MOUTH AT BEDTIME AS NEEDED   ibuprofen 200 MG tablet Commonly known as: ADVIL Take 800 mg by mouth every 8 (eight) hours as needed (for pain.).   oxyCODONE 5 MG immediate release tablet Commonly known as: Oxy IR/ROXICODONE Take 1-2 tablets (5-10 mg total) by mouth every 6 (six) hours as needed for moderate pain (pain score 4-6) (pain score 4-6).   tiZANidine 4 MG tablet Commonly known as: ZANAFLEX TAKE 1 TABLET(4 MG) BY MOUTH EVERY 8 HOURS AS NEEDED FOR MUSCLE SPASMS What changed: Another medication with the same name was added. Make sure you understand how and when to take each.   tiZANidine 4 MG tablet Commonly known as: ZANAFLEX Take 1 tablet (4 mg total) by mouth every 6 (six) hours as needed for muscle spasms. What changed: You were already taking a medication with the same name, and this prescription was added. Make sure you  understand how and when to take each.   VITAMIN C PO Take 1 tablet by mouth in the morning.               Durable Medical Equipment  (From admission, onward)           Start     Ordered   01/19/23 1543  DME 3 n 1  Once        01/19/23 1542   01/19/23 1543  DME Walker rolling  Once       Question Answer Comment  Walker: With 5 Inch Wheels   Patient needs a walker to treat with the following condition Status post total left knee replacement      01/19/23 1542            Diagnostic Studies: DG Knee Left Port Result Date: 01/19/2023 CLINICAL DATA:  Status post total left knee arthroplasty. EXAM: PORTABLE LEFT KNEE - 1-2 VIEW COMPARISON:  Left knee radiographs 12/04/2022 FINDINGS: Interval total left knee arthroplasty. No perihardware lucency is seen to indicate hardware failure or loosening. Expected postoperative changes including intra-articular and subcutaneous air. Mild-to-moderatejoint effusion. Anterior surgical skin staples. No acute fracture or dislocation. IMPRESSION: Interval total left knee arthroplasty without evidence of hardware failure. Electronically Signed   By: Neita Garnet M.D.   On: 01/19/2023 15:35    Disposition: Discharge disposition: 01-Home or Self Care          Follow-up Information     Kathryne Hitch, MD Follow up in 2 week(s).   Specialty: Orthopedic Surgery Contact information: [address] [place] [address] [phone]                  Signed: Kathryne Hitch 01/20/2023, 11:13 AM

## 2023-01-20 NOTE — Progress Notes (Signed)
Physical Therapy Treatment Patient Details Name: Tracy Mercado MRN: [id number] DOB: 1960-12-21 Today's Date: 01/20/2023   History of Present Illness 63 yo female presents to therapy s/p L TKA on 01/19/2023 due to failure of conservative measures. Pt PMH includes but is not limited to: CAD, HTN, R TKA (2023) tobacco abuse and arthritis.    PT Comments  Pt ambulated in hallway, practiced stairs, and used bathroom before sitting in recliner.  Pt performed LE exercises and declined HEP handout stating she still has others from her previous R TKA.  HHPT also to follow up.  Pt feels ready for d/c home today.     If plan is discharge home, recommend the following: A little help with walking and/or transfers;A little help with bathing/dressing/bathroom;Assistance with cooking/housework;Assist for transportation;Help with stairs or ramp for entrance   Can travel by private vehicle        Equipment Recommendations  None recommended by PT    Recommendations for Other Services       Precautions / Restrictions Precautions Precautions: Knee;Fall Restrictions Weight Bearing Restrictions Per Provider Order: No LLE Weight Bearing Per Provider Order: Weight bearing as tolerated     Mobility  Bed Mobility Overal bed mobility: Needs Assistance Bed Mobility: Supine to Sit     Supine to sit: Supervision, HOB elevated, Used rails          Transfers Overall transfer level: Needs assistance Equipment used: Rolling walker (2 wheels) Transfers: Sit to/from Stand Sit to Stand: Contact guard assist, Supervision           General transfer comment: verbal cues for UE and LE positioning    Ambulation/Gait Ambulation/Gait assistance: Contact guard assist, Supervision Gait Distance (Feet): 200 Feet Assistive device: Rolling walker (2 wheels) Gait Pattern/deviations: Step-to pattern, Antalgic, Trunk flexed, Decreased stance time - left Gait velocity: decreased     General Gait Details:  slight trunk flexion, cues for posture and step length; no unsteadiness observed   Stairs Stairs: Yes Stairs assistance: Contact guard assist Stair Management: Forwards, Two rails Number of Stairs: 4 General stair comments: pt performed 4 steps x2; pt reports she can reach both rails at home; pt reports understanding   Wheelchair Mobility     Tilt Bed    Modified Rankin (Stroke Patients Only)       Balance                                            Cognition Arousal: Alert Behavior During Therapy: WFL for tasks assessed/performed Overall Cognitive Status: Within Functional Limits for tasks assessed                                          Exercises Total Joint Exercises Ankle Circles/Pumps: AROM, Both, 10 reps Quad Sets: AROM, Both, 10 reps Heel Slides: AAROM, Left, 10 reps, Seated Hip ABduction/ADduction: AAROM, Left, 10 reps Straight Leg Raises: AAROM, Left, 10 reps    General Comments        Pertinent Vitals/Pain Pain Assessment Pain Assessment: 0-10 Pain Score: 7  Pain Location: L knee and LE Pain Descriptors / Indicators: Sore, Aching Pain Intervention(s): Repositioned, Monitored during session, Premedicated before session    Home Living  Prior Function            PT Goals (current goals can now be found in the care plan section) Progress towards PT goals: Progressing toward goals    Frequency    7X/week      PT Plan      Co-evaluation              AM-PAC PT "6 Clicks" Mobility   Outcome Measure  Help needed turning from your back to your side while in a flat bed without using bedrails?: A Little Help needed moving from lying on your back to sitting on the side of a flat bed without using bedrails?: A Little Help needed moving to and from a bed to a chair (including a wheelchair)?: A Little Help needed standing up from a chair using your arms (e.g.,  wheelchair or bedside chair)?: A Little Help needed to walk in hospital room?: A Little Help needed climbing 3-5 steps with a railing? : A Little 6 Click Score: 18    End of Session Equipment Utilized During Treatment: Gait belt Activity Tolerance: Patient tolerated treatment well;No increased pain Patient left: with call bell/phone within reach;with chair alarm set;with family/visitor present;in chair Nurse Communication: Mobility status PT Visit Diagnosis: Other abnormalities of gait and mobility (R26.89);Pain Pain - Right/Left: Left Pain - part of body: Knee     Time: 1610-9604 PT Time Calculation (min) (ACUTE ONLY): 23 min  Charges:    $Gait Training: 8-22 mins $Therapeutic Exercise: 8-22 mins PT General Charges $$ ACUTE PT VISIT: 1 Visit                    Paulino Door, DPT Physical Therapist Acute Rehabilitation Services Office: [phone]    [name] 01/20/2023, 12:44 PM

## 2023-01-20 NOTE — Discharge Instructions (Signed)
Per [facility] clinic policy, our goal is ensure optimal postoperative pain control with a multimodal pain management strategy. For all [facility] patients, our goal is to wean post-operative narcotic medications by 6 weeks post-operatively. If this is not possible due to utilization of pain medication prior to surgery, your [facility] doctor will support your acute post-operative pain control for the first 6 weeks postoperatively, with a plan to transition you back to your primary pain team following that. Tracy Mercado will work to ensure a Therapist, occupational.  INSTRUCTIONS AFTER JOINT REPLACEMENT   Remove items at home which could result in a fall. This includes throw rugs or furniture in walking pathways ICE to the affected joint every three hours while awake for 30 minutes at a time, for at least the first 3-5 days, and then as needed for pain and swelling.  Continue to use ice for pain and swelling. You may notice swelling that will progress down to the foot and ankle.  This is normal after surgery.  Elevate your leg when you are not up walking on it.   Continue to use the breathing machine you got in the hospital (incentive spirometer) which will help keep your temperature down.  It is common for your temperature to cycle up and down following surgery, especially at night when you are not up moving around and exerting yourself.  The breathing machine keeps your lungs expanded and your temperature down.   DIET:  As you were doing prior to hospitalization, we recommend a well-balanced diet.  DRESSING / WOUND CARE / SHOWERING  Keep the surgical dressing until follow up.  The dressing is water proof, so you can shower without any extra covering.  IF THE DRESSING FALLS OFF or the wound gets wet inside, change the dressing with sterile gauze.  Please use good hand washing techniques before changing the dressing.  Do not use any lotions or creams on the incision until instructed by your surgeon.     ACTIVITY  Increase activity slowly as tolerated, but follow the weight bearing instructions below.   No driving for 6 weeks or until further direction given by your physician.  You cannot drive while taking narcotics.  No lifting or carrying greater than 10 lbs. until further directed by your surgeon. Avoid periods of inactivity such as sitting longer than an hour when not asleep. This helps prevent blood clots.  You may return to work once you are authorized by your doctor.     WEIGHT BEARING   Weight bearing as tolerated with assist device (walker, cane, etc) as directed, use it as long as suggested by your surgeon or therapist, typically at least 4-6 weeks.   EXERCISES  Results after joint replacement surgery are often greatly improved when you follow the exercise, range of motion and muscle strengthening exercises prescribed by your doctor. Safety measures are also important to protect the joint from further injury. Any time any of these exercises cause you to have increased pain or swelling, decrease what you are doing until you are comfortable again and then slowly increase them. If you have problems or questions, call your caregiver or physical therapist for advice.   Rehabilitation is important following a joint replacement. After just a few days of immobilization, the muscles of the leg can become weakened and shrink (atrophy).  These exercises are designed to build up the tone and strength of the thigh and leg muscles and to improve motion. Often times heat used for twenty to thirty minutes before  working out will loosen up your tissues and help with improving the range of motion but do not use heat for the first two weeks following surgery (sometimes heat can increase post-operative swelling).   These exercises can be done on a training (exercise) mat, on the floor, on a table or on a bed. Use whatever works the best and is most comfortable for you.    Use music or television  while you are exercising so that the exercises are a pleasant break in your day. This will make your life better with the exercises acting as a break in your routine that you can look forward to.   Perform all exercises about fifteen times, three times per day or as directed.  You should exercise both the operative leg and the other leg as well.  Exercises include:   Quad Sets - Tighten up the muscle on the front of the thigh (Quad) and hold for 5-10 seconds.   Straight Leg Raises - With your knee straight (if you were given a brace, keep it on), lift the leg to 60 degrees, hold for 3 seconds, and slowly lower the leg.  Perform this exercise against resistance later as your leg gets stronger.  Leg Slides: Lying on your back, slowly slide your foot toward your buttocks, bending your knee up off the floor (only go as far as is comfortable). Then slowly slide your foot back down until your leg is flat on the floor again.  Angel Wings: Lying on your back spread your legs to the side as far apart as you can without causing discomfort.  Hamstring Strength:  Lying on your back, push your heel against the floor with your leg straight by tightening up the muscles of your buttocks.  Repeat, but this time bend your knee to a comfortable angle, and push your heel against the floor.  You may put a pillow under the heel to make it more comfortable if necessary.   A rehabilitation program following joint replacement surgery can speed recovery and prevent re-injury in the future due to weakened muscles. Contact your doctor or a physical therapist for more information on knee rehabilitation.    CONSTIPATION  Constipation is defined medically as fewer than three stools per week and severe constipation as less than one stool per week.  Even if you have a regular bowel pattern at home, your normal regimen is likely to be disrupted due to multiple reasons following surgery.  Combination of anesthesia, postoperative  narcotics, change in appetite and fluid intake all can affect your bowels.   YOU MUST use at least one of the following options; they are listed in order of increasing strength to get the job done.  They are all available over the counter, and you may need to use some, POSSIBLY even all of these options:    Drink plenty of fluids (prune juice may be helpful) and high fiber foods Colace 100 mg by mouth twice a day  Senokot for constipation as directed and as needed Dulcolax (bisacodyl), take with full glass of water  Miralax (polyethylene glycol) once or twice a day as needed.  If you have tried all these things and are unable to have a bowel movement in the first 3-4 days after surgery call either your surgeon or your primary doctor.    If you experience loose stools or diarrhea, hold the medications until you stool forms back up.  If your symptoms do not get better within 1 week  or if they get worse, check with your doctor.  If you experience "the worst abdominal pain ever" or develop nausea or vomiting, please contact the office immediately for further recommendations for treatment.   ITCHING:  If you experience itching with your medications, try taking only a single pain pill, or even half a pain pill at a time.  You can also use Benadryl over the counter for itching or also to help with sleep.   TED HOSE STOCKINGS:  Use stockings on both legs until for at least 2 weeks or as directed by physician office. They may be removed at night for sleeping.  MEDICATIONS:  See your medication summary on the "After Visit Summary" that nursing will review with you.  You may have some home medications which will be placed on hold until you complete the course of blood thinner medication.  It is important for you to complete the blood thinner medication as prescribed.  PRECAUTIONS:  If you experience chest pain or shortness of breath - call 911 immediately for transfer to the hospital emergency department.    If you develop a fever greater that 101 F, purulent drainage from wound, increased redness or drainage from wound, foul odor from the wound/dressing, or calf pain - CONTACT YOUR SURGEON.                                                   FOLLOW-UP APPOINTMENTS:  If you do not already have a post-op appointment, please call the office for an appointment to be seen by your surgeon.  Guidelines for how soon to be seen are listed in your "After Visit Summary", but are typically between 1-4 weeks after surgery.  OTHER INSTRUCTIONS:   Knee Replacement:  Do not place pillow under knee, focus on keeping the knee straight while resting. CPM instructions: 0-90 degrees, 2 hours in the morning, 2 hours in the afternoon, and 2 hours in the evening. Place foam block, curve side up under heel at all times except when in CPM or when walking.  DO NOT modify, tear, cut, or change the foam block in any way.  POST-OPERATIVE OPIOID TAPER INSTRUCTIONS: It is important to wean off of your opioid medication as soon as possible. If you do not need pain medication after your surgery it is ok to stop day one. Opioids include: Codeine, Hydrocodone(Norco, Vicodin), Oxycodone(Percocet, oxycontin) and hydromorphone amongst others.  Long term and even short term use of opiods can cause: Increased pain response Dependence Constipation Depression Respiratory depression And more.  Withdrawal symptoms can include Flu like symptoms Nausea, vomiting And more Techniques to manage these symptoms Hydrate well Eat regular healthy meals Stay active Use relaxation techniques(deep breathing, meditating, yoga) Do Not substitute Alcohol to help with tapering If you have been on opioids for less than two weeks and do not have pain than it is ok to stop all together.  Plan to wean off of opioids This plan should start within one week post op of your joint replacement. Maintain the same interval or time between taking each dose  and first decrease the dose.  Cut the total daily intake of opioids by one tablet each day Next start to increase the time between doses. The last dose that should be eliminated is the evening dose.   MAKE SURE YOU:  Understand these instructions.  Get help right away if you are not doing well or get worse.    Thank you for letting us be a part of your medical care team.  It is a privilege we respect greatly.  We hope these instructions will help you stay on track for a fast and full recovery!      Dental Antibiotics:  In most cases prophylactic antibiotics for Dental procdeures after total joint surgery are not necessary.  Exceptions are as follows:  1. History of prior total joint infection  2. Severely immunocompromised (Organ Transplant, cancer chemotherapy, Rheumatoid biologic meds such as Humera)  3. Poorly controlled diabetes (A1C &gt; 8.0, blood glucose over 200)  If you have one of these conditions, contact your surgeon for an antibiotic prescription, prior to your dental procedure.

## 2023-01-20 NOTE — Progress Notes (Signed)
Subjective: 1 Day Post-Op Procedure(s) (LRB): LEFT TOTAL KNEE ARTHROPLASTY (Left) Patient reports pain as moderate.    Objective: Vital signs in last 24 hours: Temp:  [97.5 F (36.4 C)-98.6 F (37 C)] 97.7 F (36.5 C) (01/18 0909) Pulse Rate:  [62-82] 75 (01/18 0909) Resp:  [11-20] 17 (01/18 0909) BP: (117-156)/(47-111) 129/90 (01/18 0909) SpO2:  [89 %-100 %] 89 % (01/18 0909)  Intake/Output from previous day: 01/17 0701 - 01/18 0700 In: 967 [P.O.:480; I.V.:287; IV Piggyback:200] Out: 1900 [Urine:1800; Blood:100] Intake/Output this shift: Total I/O In: 559.4 [P.O.:240; I.V.:319.4] Out: 400 [Urine:400]  Recent Labs    01/20/23 0333  HGB 10.5*   Recent Labs    01/20/23 0333  WBC 10.4  RBC 3.04*  HCT 32.4*  PLT 100*   Recent Labs    01/20/23 0333  NA 128*  K 5.7*  CL 98  CO2 29  BUN 12  CREATININE 0.70  GLUCOSE 185*  CALCIUM 9.0   No results for input(s): "LABPT", "INR" in the last 72 hours.  Sensation intact distally Intact pulses distally Dorsiflexion/Plantar flexion intact Incision: dressing C/D/I Compartment soft   Assessment/Plan: 1 Day Post-Op Procedure(s) (LRB): LEFT TOTAL KNEE ARTHROPLASTY (Left) Up with therapy Discharge home with home health      Tracy Mercado 01/20/2023, 11:11 AM

## 2023-01-20 NOTE — Plan of Care (Signed)
  Problem: Safety: Goal: Ability to remain free from injury will improve Outcome: Progressing   Problem: Education: Goal: Knowledge of the prescribed therapeutic regimen will improve Outcome: Progressing   Problem: Activity: Goal: Range of joint motion will improve Outcome: Progressing   Problem: Pain Management: Goal: Pain level will decrease with appropriate interventions Outcome: Progressing   

## 2023-01-20 NOTE — Plan of Care (Signed)
  Problem: Education: Goal: Knowledge of General Education information will improve Description: Including pain rating scale, medication(s)/side effects and non-pharmacologic comfort measures Outcome: Progressing   Problem: Health Behavior/Discharge Planning: Goal: Ability to manage health-related needs will improve Outcome: Progressing   Problem: Clinical Measurements: Goal: Ability to maintain clinical measurements within normal limits will improve Outcome: Progressing Goal: Will remain free from infection Outcome: Progressing Goal: Diagnostic test results will improve Outcome: Progressing Goal: Respiratory complications will improve Outcome: Progressing Goal: Cardiovascular complication will be avoided Outcome: Progressing   Problem: Activity: Goal: Risk for activity intolerance will decrease Outcome: Progressing   Problem: Nutrition: Goal: Adequate nutrition will be maintained Outcome: Progressing   Problem: Coping: Goal: Level of anxiety will decrease Outcome: Progressing   Problem: Elimination: Goal: Will not experience complications related to bowel motility Outcome: Progressing Goal: Will not experience complications related to urinary retention Outcome: Progressing   Problem: Pain Managment: Goal: General experience of comfort will improve and/or be controlled Outcome: Progressing   Problem: Safety: Goal: Ability to remain free from injury will improve Outcome: Progressing   Problem: Skin Integrity: Goal: Risk for impaired skin integrity will decrease Outcome: Progressing   Problem: Education: Goal: Knowledge of the prescribed therapeutic regimen will improve Outcome: Progressing Goal: Individualized Educational Video(s) Outcome: Progressing   Problem: Activity: Goal: Ability to avoid complications of mobility impairment will improve Outcome: Progressing Goal: Range of joint motion will improve Outcome: Progressing   Problem: Clinical  Measurements: Goal: Postoperative complications will be avoided or minimized Outcome: Progressing   Problem: Pain Management: Goal: Pain level will decrease with appropriate interventions Outcome: Progressing   Problem: Skin Integrity: Goal: Will show signs of wound healing Outcome: Progressing   Problem: Education: Goal: Knowledge of General Education information will improve Description: Including pain rating scale, medication(s)/side effects and non-pharmacologic comfort measures Outcome: Progressing   Problem: Health Behavior/Discharge Planning: Goal: Ability to manage health-related needs will improve Outcome: Progressing   Problem: Clinical Measurements: Goal: Ability to maintain clinical measurements within normal limits will improve Outcome: Progressing Goal: Will remain free from infection Outcome: Progressing Goal: Diagnostic test results will improve Outcome: Progressing Goal: Respiratory complications will improve Outcome: Progressing Goal: Cardiovascular complication will be avoided Outcome: Progressing   Problem: Activity: Goal: Risk for activity intolerance will decrease Outcome: Progressing   Problem: Nutrition: Goal: Adequate nutrition will be maintained Outcome: Progressing   Problem: Coping: Goal: Level of anxiety will decrease Outcome: Progressing   Problem: Elimination: Goal: Will not experience complications related to bowel motility Outcome: Progressing Goal: Will not experience complications related to urinary retention Outcome: Progressing   Problem: Pain Managment: Goal: General experience of comfort will improve and/or be controlled Outcome: Progressing   Problem: Safety: Goal: Ability to remain free from injury will improve Outcome: Progressing   Problem: Skin Integrity: Goal: Risk for impaired skin integrity will decrease Outcome: Progressing   Problem: Education: Goal: Knowledge of the prescribed therapeutic regimen will  improve Outcome: Progressing Goal: Individualized Educational Video(s) Outcome: Progressing   Problem: Activity: Goal: Ability to avoid complications of mobility impairment will improve Outcome: Progressing Goal: Range of joint motion will improve Outcome: Progressing   Problem: Clinical Measurements: Goal: Postoperative complications will be avoided or minimized Outcome: Progressing   Problem: Pain Management: Goal: Pain level will decrease with appropriate interventions Outcome: Progressing   Problem: Skin Integrity: Goal: Will show signs of wound healing Outcome: Progressing

## 2023-01-22 ENCOUNTER — Telehealth: Payer: Self-pay | Admitting: Orthopaedic Surgery

## 2023-01-22 ENCOUNTER — Encounter (HOSPITAL_COMMUNITY): Payer: Self-pay | Admitting: Orthopaedic Surgery

## 2023-01-22 NOTE — Telephone Encounter (Signed)
 Received auth & $25 cash. To [employer].

## 2023-01-22 NOTE — Telephone Encounter (Signed)
Patient has not had home PT. Would like to know if she will get home PT before starting outpatient PT. Wife would like a call [phone]

## 2023-01-24 ENCOUNTER — Telehealth: Payer: Self-pay | Admitting: Orthopaedic Surgery

## 2023-01-24 ENCOUNTER — Other Ambulatory Visit: Payer: Self-pay | Admitting: Orthopaedic Surgery

## 2023-01-24 MED ORDER — OXYCODONE HCL 5 MG PO TABS: 5.0000 mg | ORAL_TABLET | Freq: Four times a day (QID) | ORAL | 0 refills | Status: DC | PRN

## 2023-01-24 NOTE — Therapy (Incomplete)
OUTPATIENT PHYSICAL THERAPY LOWER EXTREMITY EVALUATION   Patient Name: Tracy Mercado MRN: [id number] DOB:July 27, 1960, 63 y.o., female Today's Date: 01/25/2023  END OF SESSION:  PT End of Session - 01/25/23 1054     Visit Number 1    Number of Visits 17    Date for PT Re-Evaluation 03/22/23    Authorization Type [place] MEDICAID PREPAID HEALTH PLAN    PT Start Time 1015    PT Stop Time 1045    PT Time Calculation (min) 30 min    Activity Tolerance Patient tolerated treatment well    Behavior During Therapy WFL for tasks assessed/performed             Past Medical History:  Diagnosis Date   Arthritis    CAD (coronary artery disease)    Hypertension    Past Surgical History:  Procedure Laterality Date   HAND SURGERY Left    KNEE ARTHROSCOPY     TOTAL KNEE ARTHROPLASTY Right 04/05/2021   Procedure: Right TOTAL KNEE ARTHROPLASTY;  Surgeon: Kathryne Hitch, MD;  Location: [facility];  Service: Orthopedics;  Laterality: Right;   TOTAL KNEE ARTHROPLASTY Left 01/19/2023   Procedure: LEFT TOTAL KNEE ARTHROPLASTY;  Surgeon: Kathryne Hitch, MD;  Location: [facility];  Service: Orthopedics;  Laterality: Left;   Patient Active Problem List   Diagnosis Date Noted   Status post total left knee replacement 01/19/2023   Unilateral primary osteoarthritis, left knee 12/04/2022   Status post right knee replacement 04/05/2021    PCP: N/A  REFERRING PROVIDER: Kathryne Hitch, MD  REFERRING DIAG: [phone] (ICD-10-CM) - Status post left knee replacement   THERAPY DIAG:  Acute pain of left knee  Other abnormalities of gait and mobility  Muscle weakness (generalized)  Localized edema  Rationale for Evaluation and Treatment: Rehabilitation  ONSET DATE: 01/19/2023  SUBJECTIVE:   SUBJECTIVE STATEMENT: EVAL: Patient comes in with daughter 6 days post-op left TKA. Patient reports pain is most limiting factor to knee motion right now. She reports that she is using the  ice 2x3 times a day but not currently elevating the knee. She has no issues sleeping when taking the pain medication which she is taking on a consistent basis. Her last intake of the oxycodone was at 5 AM and she took 800 mlg of ibuprofen at 8 AM. Reports that her nerve block has worn off. She had a TKA on the R knee last year and she reports the L knee feels better initially after surgery. Her walking is going well and she is able to do stairs with both legs on each stair. She reports she was sent home with a brace but has not been wearing it. Overall, she feels she is in a good place for being a week out of surgery.  PERTINENT HISTORY: L TKA 01/19/2023 R TKA 04/05/2021  PAIN:  Are you having pain? Yes: NPRS scale: 7/10 current, 10/10.  Pain location: left knee Pain description: sharp, throbbing, achy Aggravating factors: everything  Relieving factors: ice and pain medication  PRECAUTIONS: Knee and Fall  RED FLAGS: None   WEIGHT BEARING RESTRICTIONS:  WBAT  FALLS:  Has patient fallen in last 6 months? No  LIVING ENVIRONMENT: Lives with: lives with their family Lives in: House/apartment Stairs: Yes: External: 14 steps; can reach both Has following equipment at home: Single point cane  OCCUPATION: N/A  PLOF: Independent  PATIENT GOALS: Return to ADLs without pain, regain ROM.   NEXT MD VISIT: January 30th  OBJECTIVE:  Note: Objective measures were completed at Evaluation unless otherwise noted.  PATIENT SURVEYS:  FOTO: 40% functional status, predicted 56%  COGNITION: Overall cognitive status: Within functional limits for tasks assessed    EDEMA:  Notable edema in left knee   MUSCLE LENGTH: Assess hamstring tightness next session   POSTURE: No Significant postural limitations  PALPATION: Edema in left knee   LOWER EXTREMITY ROM:  Active ROM Right eval Left eval  Hip flexion    Hip extension    Hip abduction    Hip adduction    Hip internal rotation     Hip external rotation    Knee flexion 110 85  Knee extension -2 1  Ankle dorsiflexion    Ankle plantarflexion    Ankle inversion    Ankle eversion     (Blank rows = not tested)  LOWER EXTREMITY MMT:  MMT Right eval Left eval  Hip flexion    Hip extension    Hip abduction    Hip adduction    Hip internal rotation    Hip external rotation    Knee flexion    Knee extension    Ankle dorsiflexion    Ankle plantarflexion    Ankle inversion    Ankle eversion     (Blank rows = not tested)  FUNCTIONAL TESTS:  Complete 30 sec sit to stand next session   GAIT: Distance walked: 40 Assistive device utilized: Single point cane Level of assistance: Complete Independence Comments: Antalgic gait                                                                                                                                TREATMENT DATE:  01/25/2023 Therapeutic exercise:  Quad set with towel under knee 1x10x5"  Quad set with towel under ankle 1x10x5"  SLR 1x10  Supine gastroc stretch 2x30"  Supine heel slides 1x10x"  PATIENT EDUCATION:  Education details: Exam findings, POC, HEP.  Person educated: Patient Education method: Explanation, Demonstration, Tactile cues, Verbal cues, and Handouts Education comprehension: verbalized understanding, returned demonstration, verbal cues required, tactile cues required, and needs further education  HOME EXERCISE PROGRAM: Access Code: [email or web address]   ASSESSMENT:  CLINICAL IMPRESSION: Patient is a 63 y.o. female who was seen today for physical therapy evaluation and treatment for s/p left TKA on 01/19/2023. Patient responded well to therapy with no adverse effects. Patient has limited ROM in comparison to the right knee due to edema post op. Patient able to activate quad and complete a SLR, continue to progress quad strength in next sessions. Patient walks with a step through pattern at initial session indicating some initial level of quad  strength. Overall, patients pain levels are tolerable during treatment session. FOTO score of 40% indicating a decrease in functional ability following surgery. Patient would benefit from skilled PT to decrease pain levels and increase ROM to return to her functional levels prior to surgery.   OBJECTIVE  IMPAIRMENTS: Abnormal gait, decreased ROM, decreased strength, increased edema, and pain.   ACTIVITY LIMITATIONS: sitting, standing, squatting, stairs, and locomotion level  PARTICIPATION LIMITATIONS: community activity  PERSONAL FACTORS: Past/current experiences and Time since onset of injury/illness/exacerbation are also affecting patient's functional outcome.   REHAB POTENTIAL: Good  CLINICAL DECISION MAKING: Stable/uncomplicated  EVALUATION COMPLEXITY: Low   GOALS: Goals reviewed with patient? Yes  SHORT TERM GOALS: Target date: 02/22/2023  Patient will be I with initial HEP in order to progress with therapy. Baseline: Goal status: INITIAL  2.  Patient will score </= 8/10 at worse on the NPS by 3rd visit in order to show an improvement in pain allowing for an increase in functional ability.  Baseline: 10/10 Goal status: INITIAL   LONG TERM GOALS: Target date: 03/22/2023  Patient will be independent with final HEP in order to continue treatment at home for pain tolerance.  Baseline:  Goal status: INITIAL  2.  Patient will score at least 56% on FOTO to signify clinically meaningful improvement in functional abilities.  Baseline: 40% Goal status: INITIAL  3.  Patient will score </= 5/10 at worse on the NPSin order to show an improvement in pain allowing for an increase in functional ability.   Baseline: 10/10 Goal status: INITIAL  4.  Patient will increase knee flexion ROM by  at least 25 degrees to increase functional ability.  Baseline: 85 deg Goal status: INITIAL  5.  Patient will increase knee strength to a 4/5 to show an increase in quad strength to improve gait  and functional ability. Baseline: 3/5 Goal status: INITIAL  PLAN:  PT FREQUENCY: 2x/week  PT DURATION: 8 weeks  PLANNED INTERVENTIONS: 97164- PT Re-evaluation, 97110-Therapeutic exercises, 97530- Therapeutic activity, 97112- Neuromuscular re-education, 97535- Self Care, 40981- Manual therapy, [phone]- Gait training, [phone]- Vasopneumatic device, Patient/Family education, Stair training, Dry Needling, Joint mobilization, Joint manipulation, Spinal manipulation, Spinal mobilization, Cryotherapy, and Moist heat  PLAN FOR NEXT SESSION: review/revise HEP, functional assessment of sit to stand, continue quad strengthening (LAQ next session)   Erin Hearing, Student-PT 01/25/2023, 1:15 PM

## 2023-01-24 NOTE — Telephone Encounter (Signed)
Patient called needing Rx refilled Oxycodone 5 mg. Patient said she has 2 tabs left. The number to contact patient is [phone]

## 2023-01-25 ENCOUNTER — Other Ambulatory Visit: Payer: Self-pay

## 2023-01-25 ENCOUNTER — Ambulatory Visit: Payer: Medicaid Other | Attending: Orthopaedic Surgery | Admitting: Physical Therapy

## 2023-01-25 ENCOUNTER — Encounter: Payer: Self-pay | Admitting: Physical Therapy

## 2023-01-25 DIAGNOSIS — R2689 Other abnormalities of gait and mobility: Secondary | ICD-10-CM | POA: Insufficient documentation

## 2023-01-25 DIAGNOSIS — M25562 Pain in left knee: Secondary | ICD-10-CM | POA: Diagnosis present

## 2023-01-25 DIAGNOSIS — M6281 Muscle weakness (generalized): Secondary | ICD-10-CM | POA: Diagnosis present

## 2023-01-25 DIAGNOSIS — Z96652 Presence of left artificial knee joint: Secondary | ICD-10-CM | POA: Diagnosis not present

## 2023-01-25 DIAGNOSIS — R600 Localized edema: Secondary | ICD-10-CM | POA: Insufficient documentation

## 2023-01-25 NOTE — Patient Instructions (Signed)
Access Code: [email or web address] URL: [email or web address] Date: 01/25/2023 Prepared by: Rosana Hoes  Exercises - Supine Quad Set  - 3 x daily - 10 reps - 5 seconds hold - Long Sitting Quad Set with Towel Roll Under Heel  - 3 x daily - 10 reps - 5 seconds hold - Supine Calf Stretch with Strap  - 3 x daily - 3 reps - 30 seconds hold - Supine Heel Slide with Strap  - 3 x daily - 10 reps - 5 seconds hold - Supine Straight Leg Raises  - 3 x daily - 10 reps - Ankle Alphabet in Elevation

## 2023-01-29 ENCOUNTER — Other Ambulatory Visit: Payer: Self-pay | Admitting: Orthopaedic Surgery

## 2023-01-29 ENCOUNTER — Telehealth: Payer: Self-pay | Admitting: Orthopaedic Surgery

## 2023-01-29 MED ORDER — OXYCODONE HCL 5 MG PO TABS: 5.0000 mg | ORAL_TABLET | Freq: Four times a day (QID) | ORAL | 0 refills | Status: DC | PRN

## 2023-01-29 NOTE — Telephone Encounter (Signed)
Patient's wife Harriett Sine called advised patient need Rx refilled Oxycodone 5 mg. Harriett Sine said patient is out of her medication as of this morning. The number to contact Harriett Sine is [phone]    Patient's ph# is [phone]

## 2023-01-30 ENCOUNTER — Ambulatory Visit: Payer: Medicaid Other

## 2023-01-30 ENCOUNTER — Other Ambulatory Visit: Payer: Self-pay

## 2023-01-30 DIAGNOSIS — R600 Localized edema: Secondary | ICD-10-CM

## 2023-01-30 DIAGNOSIS — R2689 Other abnormalities of gait and mobility: Secondary | ICD-10-CM

## 2023-01-30 DIAGNOSIS — M6281 Muscle weakness (generalized): Secondary | ICD-10-CM

## 2023-01-30 DIAGNOSIS — M25562 Pain in left knee: Secondary | ICD-10-CM | POA: Diagnosis not present

## 2023-01-30 NOTE — Therapy (Addendum)
OUTPATIENT PHYSICAL THERAPY LOWER EXTREMITY TREATMENT  Patient Name: Tracy Mercado MRN: [id number] DOB:1960/03/15, 63 y.o., female Today's Date: 01/30/2023  END OF SESSION:  PT End of Session - 01/30/23 0843     Visit Number 2    Number of Visits 17    Date for PT Re-Evaluation 03/22/23    Authorization Type [place] MEDICAID PREPAID HEALTH PLAN    PT Start Time 0800    PT Stop Time 0840    PT Time Calculation (min) 40 min    Activity Tolerance Patient tolerated treatment well    Behavior During Therapy WFL for tasks assessed/performed             Past Medical History:  Diagnosis Date   Arthritis    CAD (coronary artery disease)    Hypertension    Past Surgical History:  Procedure Laterality Date   HAND SURGERY Left    KNEE ARTHROSCOPY     TOTAL KNEE ARTHROPLASTY Right 04/05/2021   Procedure: Right TOTAL KNEE ARTHROPLASTY;  Surgeon: Kathryne Hitch, MD;  Location: [facility];  Service: Orthopedics;  Laterality: Right;   TOTAL KNEE ARTHROPLASTY Left 01/19/2023   Procedure: LEFT TOTAL KNEE ARTHROPLASTY;  Surgeon: Kathryne Hitch, MD;  Location: [facility];  Service: Orthopedics;  Laterality: Left;   Patient Active Problem List   Diagnosis Date Noted   Status post total left knee replacement 01/19/2023   Unilateral primary osteoarthritis, left knee 12/04/2022   Status post right knee replacement 04/05/2021    PCP: N/A  REFERRING PROVIDER: Kathryne Hitch, MD  REFERRING DIAG: [phone] (ICD-10-CM) - Status post left knee replacement   THERAPY DIAG:  Acute pain of left knee  Other abnormalities of gait and mobility  Muscle weakness (generalized)  Localized edema  Rationale for Evaluation and Treatment: Rehabilitation  ONSET DATE: 01/19/2023  SUBJECTIVE:   SUBJECTIVE STATEMENT: Patient comes in with complaints of knee pain 11 days post-op left TKA. Continues to report swelling and discomfort. Has been compliant with HEP at home. Continues to have  more discomfort at night. Took oxycodone last night but has not needed it this morning yet. Reports that icing and elevating has helped the swelling a lot.   EVAL: Patient comes in with daughter 6 days post-op left TKA. Patient reports pain is most limiting factor to knee motion right now. She reports that she is using the ice 2x3 times a day but not currently elevating the knee. She has no issues sleeping when taking the pain medication which she is taking on a consistent basis. Her last intake of the oxycodone was at 5 AM and she took 800 mlg of ibuprofen at 8 AM. Reports that her nerve block has worn off. She had a TKA on the R knee last year and she reports the L knee feels better initially after surgery. Her walking is going well and she is able to do stairs with both legs on each stair. She reports she was sent home with a brace but has not been wearing it. Overall, she feels she is in a good place for being a week out of surgery.  PERTINENT HISTORY: L TKA 01/19/2023 R TKA 04/05/2021  PAIN:  Are you having pain? Yes: NPRS scale: 7/10 current, 10/10.  Pain location: left knee Pain description: sharp, throbbing, achy Aggravating factors: everything  Relieving factors: ice and pain medication  PRECAUTIONS: Knee and Fall  RED FLAGS: None   WEIGHT BEARING RESTRICTIONS:  WBAT  FALLS:  Has patient  fallen in last 6 months? No  LIVING ENVIRONMENT: Lives with: lives with their family Lives in: House/apartment Stairs: Yes: External: 14 steps; can reach both Has following equipment at home: Single point cane  OCCUPATION: N/A  PLOF: Independent  PATIENT GOALS: Return to ADLs without pain, regain ROM.   NEXT MD VISIT: January 30th   OBJECTIVE:  Note: Objective measures were completed at Evaluation unless otherwise noted.  PATIENT SURVEYS:  FOTO: 40% functional status, predicted 56%  COGNITION: Overall cognitive status: Within functional limits for tasks assessed    EDEMA:   Notable edema in left knee   MUSCLE LENGTH: Assess hamstring tightness next session   POSTURE: No Significant postural limitations  PALPATION: Edema in left knee   LOWER EXTREMITY ROM:  Active ROM Right eval Left eval Left 1/28  Hip flexion     Hip extension     Hip abduction     Hip adduction     Hip internal rotation     Hip external rotation     Knee flexion 110 85 100  Knee extension -2 1   Ankle dorsiflexion     Ankle plantarflexion     Ankle inversion     Ankle eversion      (Blank rows = not tested)  LOWER EXTREMITY MMT:  MMT Right eval Left eval  Hip flexion    Hip extension    Hip abduction    Hip adduction    Hip internal rotation    Hip external rotation    Knee flexion    Knee extension    Ankle dorsiflexion    Ankle plantarflexion    Ankle inversion    Ankle eversion     (Blank rows = not tested)  FUNCTIONAL TESTS:  Complete 30 sec sit to stand next session   GAIT: Distance walked: 40 Assistive device utilized: Single point cane Level of assistance: Complete Independence Comments: Antalgic gait                                                                                                                                TREATMENT DATE:  01/30/2023  Therapeutic Exercise:  Supine gastroc stretch 2x30" Quad set with towel under knee 1x15x5"  Quad set with towel under ankle 2x15x5"  SLR 3x5  Supine heel slides 2x8"  Seated heel slides 2x8"  Standing TKE against ball 1x10x5" Manual:  Patellar mobs to left knee  01/25/2023 Therapeutic exercise:  Quad set with towel under knee 1x10x5"  Quad set with towel under ankle 1x10x5"  SLR 1x10  Supine gastroc stretch 2x30"  Supine heel slides 1x10x"  PATIENT EDUCATION:  Education details: Exam findings, POC, HEP.  Person educated: Patient Education method: Explanation, Demonstration, Tactile cues, Verbal cues, and Handouts Education comprehension: verbalized understanding, returned  demonstration, verbal cues required, tactile cues required, and needs further education  HOME EXERCISE PROGRAM: Access Code: [email or web address]   ASSESSMENT:  CLINICAL IMPRESSION: Patient responded  well to therapy today with no adverse effects. Continues to be limited in ROM by swelling and a decrease in strength following post TKA on the left. Patient gained 15 degrees of flexion since last session which shows improvement post op. Patient continues to work on quad activation, continued to progress quad strengthening today. Patient's gait shows improvement indicated by more weight bearing on the left side. Pain levels continue to be tolerable for patient. Patient would benefit from skilled PT to decrease pain levels, increase ROM, and increase strength to return to functional levels prior to surgery.   EVAL: Patient is a 63 y.o. female who was seen today for physical therapy evaluation and treatment for s/p left TKA on 01/19/2023. Patient responded well to therapy with no adverse effects. Patient has limited ROM in comparison to the right knee due to edema post op. Patient able to activate quad and complete a SLR, continue to progress quad strength in next sessions. Patient walks with a step through pattern at initial session indicating some initial level of quad strength. Overall, patients pain levels are tolerable during treatment session. FOTO score of 40% indicating a decrease in functional ability following surgery. Patient would benefit from skilled PT to decrease pain levels and increase ROM to return to her functional levels prior to surgery.   OBJECTIVE IMPAIRMENTS: Abnormal gait, decreased ROM, decreased strength, increased edema, and pain.   ACTIVITY LIMITATIONS: sitting, standing, squatting, stairs, and locomotion level  PARTICIPATION LIMITATIONS: community activity  PERSONAL FACTORS: Past/current experiences and Time since onset of injury/illness/exacerbation are also affecting patient's  functional outcome.   REHAB POTENTIAL: Good  CLINICAL DECISION MAKING: Stable/uncomplicated  EVALUATION COMPLEXITY: Low   GOALS: Goals reviewed with patient? Yes  SHORT TERM GOALS: Target date: 02/22/2023  Patient will be I with initial HEP in order to progress with therapy. Baseline: Goal status: INITIAL  2.  Patient will score </= 8/10 at worse on the NPS by 3rd visit in order to show an improvement in pain allowing for an increase in functional ability.  Baseline: 10/10 Goal status: INITIAL   LONG TERM GOALS: Target date: 03/22/2023  Patient will be independent with final HEP in order to continue treatment at home for pain tolerance.  Baseline:  Goal status: INITIAL  2.  Patient will score at least 56% on FOTO to signify clinically meaningful improvement in functional abilities.  Baseline: 40% Goal status: INITIAL  3.  Patient will score </= 5/10 at worse on the NPSin order to show an improvement in pain allowing for an increase in functional ability.   Baseline: 10/10 Goal status: INITIAL  4.  Patient will increase knee flexion ROM by  at least 25 degrees to increase functional ability.  Baseline: 85 deg Goal status: INITIAL  5.  Patient will increase knee strength to a 4/5 to show an increase in quad strength to improve gait and functional ability. Baseline: 3/5 Goal status: INITIAL  PLAN:  PT FREQUENCY: 2x/week  PT DURATION: 8 weeks  PLANNED INTERVENTIONS: 97164- PT Re-evaluation, 97110-Therapeutic exercises, 97530- Therapeutic activity, 97112- Neuromuscular re-education, 97535- Self Care, 16109- Manual therapy, [phone]- Gait training, [phone]- Vasopneumatic device, Patient/Family education, Stair training, Dry Needling, Joint mobilization, Joint manipulation, Spinal manipulation, Spinal mobilization, Cryotherapy, and Moist heat  PLAN FOR NEXT SESSION: review/revise HEP, functional assessment of sit to stand, continue quad strengthening (LAQ next  session)   Erin Hearing, Student-PT 01/30/2023, 9:03 AM

## 2023-02-01 ENCOUNTER — Other Ambulatory Visit: Payer: Self-pay

## 2023-02-01 ENCOUNTER — Encounter: Payer: Self-pay | Admitting: Orthopaedic Surgery

## 2023-02-01 ENCOUNTER — Ambulatory Visit: Payer: Medicaid Other | Admitting: Physical Therapy

## 2023-02-01 ENCOUNTER — Encounter: Payer: Self-pay | Admitting: Physical Therapy

## 2023-02-01 ENCOUNTER — Ambulatory Visit: Payer: Medicaid Other | Admitting: Orthopaedic Surgery

## 2023-02-01 DIAGNOSIS — M6281 Muscle weakness (generalized): Secondary | ICD-10-CM

## 2023-02-01 DIAGNOSIS — R600 Localized edema: Secondary | ICD-10-CM

## 2023-02-01 DIAGNOSIS — M25562 Pain in left knee: Secondary | ICD-10-CM | POA: Diagnosis not present

## 2023-02-01 DIAGNOSIS — R2689 Other abnormalities of gait and mobility: Secondary | ICD-10-CM

## 2023-02-01 DIAGNOSIS — Z96652 Presence of left artificial knee joint: Secondary | ICD-10-CM

## 2023-02-01 MED ORDER — OXYCODONE HCL 5 MG PO TABS: 5.0000 mg | ORAL_TABLET | Freq: Four times a day (QID) | ORAL | 0 refills | Status: DC | PRN

## 2023-02-01 MED ORDER — TIZANIDINE HCL 4 MG PO TABS: ORAL_TABLET | ORAL | 1 refills | Status: DC

## 2023-02-01 NOTE — Progress Notes (Signed)
The patient is here for first postoperative visit status post a left total knee replacement.  We replaced her right knee several years ago.  The left knee is doing great.  She has been compliant with a baby aspirin twice daily.  She is already in outpatient physical therapy.  On exam there is extensive bruising about her leg but overall she looks good.  Her extension is almost full and she can flex for me past 90 degrees today.  Her calf is soft.  She can flex and extend at the ankle.  Her left knee incision looks good and the staples have been removed and Steri-Strips applied.  She will continue outpatient physical therapy.  I will refill her oxycodone and Zanaflex.  She can stop her baby aspirin.  Will see her back in a month to see how she is doing overall from a mobility and range of motion standpoint but no x-rays are needed.

## 2023-02-01 NOTE — Therapy (Addendum)
OUTPATIENT PHYSICAL THERAPY TREATMENT  Patient Name: Tracy Mercado MRN: [id number] DOB:08-07-1960, 63 y.o., female Today's Date: 02/01/2023  END OF SESSION:  PT End of Session - 02/01/23 1358     Visit Number 3    Number of Visits 17    Date for PT Re-Evaluation 03/22/23    Authorization Type [place] MEDICA[employer] PLAN    PT Start Time 1315    PT Stop Time 1350    PT Time Calculation (min) 35 min    Activity Tolerance Patient tolerated treatment well    Behavior During Therapy WFL for tasks assessed/performed              Past Medical History:  Diagnosis Date   Arthritis    CAD (coronary artery disease)    Hypertension    Past Surgical History:  Procedure Laterality Date   HAND SURGERY Left    KNEE ARTHROSCOPY     TOTAL KNEE ARTHROPLASTY Right 04/05/2021   Procedure: Right TOTAL KNEE ARTHROPLASTY;  Surgeon: Kathryne Hitch, MD;  Location: [facility];  Service: Orthopedics;  Laterality: Right;   TOTAL KNEE ARTHROPLASTY Left 01/19/2023   Procedure: LEFT TOTAL KNEE ARTHROPLASTY;  Surgeon: Kathryne Hitch, MD;  Location: [facility];  Service: Orthopedics;  Laterality: Left;   Patient Active Problem List   Diagnosis Date Noted   Status post total left knee replacement 01/19/2023   Unilateral primary osteoarthritis, left knee 12/04/2022   Status post right knee replacement 04/05/2021    PCP: N/A  REFERRING PROVIDER: Kathryne Hitch, MD  REFERRING DIAG: [phone] (ICD-10-CM) - Status post left knee replacement   THERAPY DIAG:  Acute pain of left knee  Muscle weakness (generalized)  Other abnormalities of gait and mobility  Localized edema  Rationale for Evaluation and Treatment: Rehabilitation  ONSET DATE: 01/19/2023  SUBJECTIVE:   SUBJECTIVE STATEMENT: Patient comes in feeling okay. She rates her pain a 5/10 currently. Her biggest concern is still the swelling and bruising of the left knee. Says that after last treatment, her pain  increased to a 10/10  and stayed there even with the pain medication. Took the pain medication at 8 am this morning. Still icing and elevating and thinks that it is helping. Sees Dr. Magnus Ivan for post-op follow up at 2:15.   EVAL: Patient comes in with daughter 6 days post-op left TKA. Patient reports pain is most limiting factor to knee motion right now. She reports that she is using the ice 2x3 times a day but not currently elevating the knee. She has no issues sleeping when taking the pain medication which she is taking on a consistent basis. Her last intake of the oxycodone was at 5 AM and she took 800 mlg of ibuprofen at 8 AM. Reports that her nerve block has worn off. She had a TKA on the R knee last year and she reports the L knee feels better initially after surgery. Her walking is going well and she is able to do stairs with both legs on each stair. She reports she was sent home with a brace but has not been wearing it. Overall, she feels she is in a good place for being a week out of surgery.  PERTINENT HISTORY: L TKA 01/19/2023 R TKA 04/05/2021  PAIN:  Are you having pain? Yes: NPRS scale: 7/10 current, 10/10 worst.  Pain location: left knee Pain description: sharp, throbbing, achy Aggravating factors: everything  Relieving factors: ice and pain medication  PRECAUTIONS: Knee and  Fall  RED FLAGS: None   WEIGHT BEARING RESTRICTIONS:  WBAT  FALLS:  Has patient fallen in last 6 months? No  LIVING ENVIRONMENT: Lives with: lives with their family Lives in: House/apartment Stairs: Yes: External: 14 steps; can reach both Has following equipment at home: Single point cane  OCCUPATION: N/A  PLOF: Independent  PATIENT GOALS: Return to ADLs without pain, regain ROM.   NEXT MD VISIT: January 30th    OBJECTIVE:  Note: Objective measures were completed at Evaluation unless otherwise noted.  PATIENT SURVEYS:  FOTO: 40% functional status, predicted 56%  COGNITION: Overall  cognitive status: Within functional limits for tasks assessed   EDEMA:  Notable edema in left knee   MUSCLE LENGTH: Assess hamstring tightness next session   POSTURE: No Significant postural limitations  PALPATION: Edema in left knee   LOWER EXTREMITY ROM:  Active ROM Right eval Left eval Left 1/28 Left 1/30  Hip flexion      Hip extension      Hip abduction      Hip adduction      Hip internal rotation      Hip external rotation      Knee flexion 110 85 100 102  Knee extension -2 1  3   Ankle dorsiflexion      Ankle plantarflexion      Ankle inversion      Ankle eversion       (Blank rows = not tested)  LOWER EXTREMITY MMT:  MMT Right eval Left eval  Hip flexion    Hip extension    Hip abduction    Hip adduction    Hip internal rotation    Hip external rotation    Knee flexion    Knee extension    Ankle dorsiflexion    Ankle plantarflexion    Ankle inversion    Ankle eversion     (Blank rows = not tested)  FUNCTIONAL TESTS:  Complete 30 sec sit to stand next session   GAIT: Distance walked: 40 Assistive device utilized: Single point cane Level of assistance: Complete Independence Comments: Antalgic gait                                                                                                                                TREATMENT DATE:  02/01/2023:  Therapeutic Exercise:  NuStep x3 min  Supine gastroc stretch 2x30"  Quad set with towel under ankle 1x15 Supine heel slides 1x15  SLR 1x15  LAQ 1x15  Sit to stand 2x8  Standing TKE against ball 2x10x5"   01/30/2023  Therapeutic Exercise:  Supine gastroc stretch 2x30" Quad set with towel under knee 1x15x5"  Quad set with towel under ankle 2x15x5"  SLR 3x5  Supine heel slides 2x8"  Seated heel slides 2x8"  Standing TKE against ball 1x10x5" Manual:  Patellar mobs to left knee   01/25/2023 Therapeutic exercise:  Quad set with towel under knee 1x10x5"  Quad set with towel under ankle  1x10x5"  SLR 1x10  Supine gastroc stretch 2x30"  Supine heel slides 1x10x"  PATIENT EDUCATION:  Education details: HEP.  Person educated: Patient Education method: Explanation, Demonstration, Tactile cues, Verbal cues Education comprehension: verbalized understanding, returned demonstration, verbal cues required, tactile cues required, and needs further education  HOME EXERCISE PROGRAM: Access Code: [email or web address]    ASSESSMENT: CLINICAL IMPRESSION: Patient responded well to therapy today with no adverse effects. Continues to be limited in ROM by swelling and a decrease in strength following post TKA on the left. Patient continues to work on quad activation, adding LAQ today to progress quad strength. Gait continues to improve with larger step through patterns and more WB on the left leg. Pain levels are still the largest limiting factor for the patient. Patient would benefit from continued skilled PT to decrease pain levels, increase ROM, and increase strength to return to functional levels prior to surgery.    EVAL: Patient is a 63 y.o. female who was seen today for physical therapy evaluation and treatment for s/p left TKA on 01/19/2023. Patient responded well to therapy with no adverse effects. Patient has limited ROM in comparison to the right knee due to edema post op. Patient able to activate quad and complete a SLR, continue to progress quad strength in next sessions. Patient walks with a step through pattern at initial session indicating some initial level of quad strength. Overall, patients pain levels are tolerable during treatment session. FOTO score of 40% indicating a decrease in functional ability following surgery. Patient would benefit from skilled PT to decrease pain levels and increase ROM to return to her functional levels prior to surgery.   OBJECTIVE IMPAIRMENTS: Abnormal gait, decreased ROM, decreased strength, increased edema, and pain.   ACTIVITY LIMITATIONS: sitting,  standing, squatting, stairs, and locomotion level  PARTICIPATION LIMITATIONS: community activity  PERSONAL FACTORS: Past/current experiences and Time since onset of injury/illness/exacerbation are also affecting patient's functional outcome.   REHAB POTENTIAL: Good  CLINICAL DECISION MAKING: Stable/uncomplicated  EVALUATION COMPLEXITY: Low   GOALS: Goals reviewed with patient? Yes  SHORT TERM GOALS: Target date: 02/22/2023  Patient will be I with initial HEP in order to progress with therapy. Baseline: Goal status: INITIAL  2.  Patient will score </= 8/10 at worse on the NPS by 3rd visit in order to show an improvement in pain allowing for an increase in functional ability.  Baseline: 10/10 Goal status: INITIAL  LONG TERM GOALS: Target date: 03/22/2023  Patient will be independent with final HEP in order to continue treatment at home for pain tolerance.  Baseline:  Goal status: INITIAL  2.  Patient will score at least 56% on FOTO to signify clinically meaningful improvement in functional abilities.  Baseline: 40% Goal status: INITIAL  3.  Patient will score </= 5/10 at worse on the NPSin order to show an improvement in pain allowing for an increase in functional ability.   Baseline: 10/10 Goal status: INITIAL  4.  Patient will increase knee flexion ROM by  at least 25 degrees to increase functional ability.  Baseline: 85 deg Goal status: INITIAL  5.  Patient will increase knee strength to a 4/5 to show an increase in quad strength to improve gait and functional ability. Baseline: 3/5 Goal status: INITIAL   PLAN: PT FREQUENCY: 2x/week  PT DURATION: 8 weeks  PLANNED INTERVENTIONS: 97164- PT Re-evaluation, 97110-Therapeutic exercises, 97530- Therapeutic activity, [id number]- Neuromuscular re-education, 97535- Self Care, [id number]- Manual therapy, [id number]- Gait  training, [id number]- Vasopneumatic device, Patient/Family education, Stair training, Dry Needling, Joint mobilization,  Joint manipulation, Spinal manipulation, Spinal mobilization, Cryotherapy, and Moist heat  PLAN FOR NEXT SESSION: review/revise HEP, continuing progressing strengthening, manual therapy to calf when bruising decreases.    Erin Hearing, Student-PT 02/01/2023, 2:03 PM

## 2023-02-02 ENCOUNTER — Telehealth: Payer: Self-pay | Admitting: Orthopaedic Surgery

## 2023-02-02 ENCOUNTER — Other Ambulatory Visit: Payer: Self-pay | Admitting: Orthopaedic Surgery

## 2023-02-02 MED ORDER — OXYCODONE HCL 5 MG PO TABS: 5.0000 mg | ORAL_TABLET | Freq: Four times a day (QID) | ORAL | 0 refills | Status: DC | PRN

## 2023-02-02 NOTE — Telephone Encounter (Signed)
Patient's wife Harriett Sine called advised the pharmacy did not have the Oxycodone at there location, Harriett Sine asked if the Rx could be sent to [facility] on  [address]  [phone]

## 2023-02-05 ENCOUNTER — Other Ambulatory Visit: Payer: Self-pay

## 2023-02-05 ENCOUNTER — Ambulatory Visit: Payer: Medicaid Other | Attending: Orthopaedic Surgery

## 2023-02-05 DIAGNOSIS — R2689 Other abnormalities of gait and mobility: Secondary | ICD-10-CM | POA: Diagnosis present

## 2023-02-05 DIAGNOSIS — R600 Localized edema: Secondary | ICD-10-CM | POA: Insufficient documentation

## 2023-02-05 DIAGNOSIS — M6281 Muscle weakness (generalized): Secondary | ICD-10-CM | POA: Insufficient documentation

## 2023-02-05 DIAGNOSIS — Z96651 Presence of right artificial knee joint: Secondary | ICD-10-CM | POA: Insufficient documentation

## 2023-02-05 DIAGNOSIS — M25562 Pain in left knee: Secondary | ICD-10-CM | POA: Insufficient documentation

## 2023-02-05 NOTE — Therapy (Addendum)
OUTPATIENT PHYSICAL THERAPY TREATMENT  Patient Name: Tracy Mercado MRN: [id number] DOB:Jul 21, 1960, 63 y.o., female Today's Date: 02/05/2023  END OF SESSION:  PT End of Session - 02/05/23 1058     Visit Number 4    Number of Visits 17    Date for PT Re-Evaluation 03/22/23    Authorization Type [employer]    PT Start Time 1059    PT Stop Time 1143    PT Time Calculation (min) 44 min    Activity Tolerance Patient tolerated treatment well    Behavior During Therapy WFL for tasks assessed/performed               Past Medical History:  Diagnosis Date   Arthritis    CAD (coronary artery disease)    Hypertension    Past Surgical History:  Procedure Laterality Date   HAND SURGERY Left    KNEE ARTHROSCOPY     TOTAL KNEE ARTHROPLASTY Right 04/05/2021   Procedure: Right TOTAL KNEE ARTHROPLASTY;  Surgeon: Kathryne Hitch, MD;  Location: [facility];  Service: Orthopedics;  Laterality: Right;   TOTAL KNEE ARTHROPLASTY Left 01/19/2023   Procedure: LEFT TOTAL KNEE ARTHROPLASTY;  Surgeon: Kathryne Hitch, MD;  Location: [facility];  Service: Orthopedics;  Laterality: Left;   Patient Active Problem List   Diagnosis Date Noted   Status post total left knee replacement 01/19/2023   Unilateral primary osteoarthritis, left knee 12/04/2022   Status post right knee replacement 04/05/2021    PCP: N/A  REFERRING PROVIDER: Kathryne Hitch, MD  REFERRING DIAG: [phone] (ICD-10-CM) - Status post left knee replacement   THERAPY DIAG:  Acute pain of left knee  Muscle weakness (generalized)  Other abnormalities of gait and mobility  Localized edema  Status post right knee replacement  Rationale for Evaluation and Treatment: Rehabilitation  ONSET DATE: 01/19/2023  SUBJECTIVE:   SUBJECTIVE STATEMENT: Patient comes in reporting that feels okay since her last session. Her swelling and bruising are still her biggest contributing factor to her pain. Reports that after PT  her pain increases hours after but is manageable. She saw Dr. Magnus Ivan on Friday and he is happy with her progress and ROM. He removed the staples which she feels like has helped her some. She took her pain medication at 9 am this morning.   EVAL: Patient comes in with daughter 6 days post-op left TKA. Patient reports pain is most limiting factor to knee motion right now. She reports that she is using the ice 2x3 times a day but not currently elevating the knee. She has no issues sleeping when taking the pain medication which she is taking on a consistent basis. Her last intake of the oxycodone was at 5 AM and she took 800 mlg of ibuprofen at 8 AM. Reports that her nerve block has worn off. She had a TKA on the R knee last year and she reports the L knee feels better initially after surgery. Her walking is going well and she is able to do stairs with both legs on each stair. She reports she was sent home with a brace but has not been wearing it. Overall, she feels she is in a good place for being a week out of surgery.  PERTINENT HISTORY: L TKA 01/19/2023 R TKA 04/05/2021  PAIN:  Are you having pain? Yes: NPRS scale: 7/10 current, 10/10 worst.  Pain location: left knee Pain description: sharp, throbbing, achy Aggravating factors: everything  Relieving factors: ice and pain medication  PRECAUTIONS: Knee and Fall  RED FLAGS: None   WEIGHT BEARING RESTRICTIONS:  WBAT  FALLS:  Has patient fallen in last 6 months? No  LIVING ENVIRONMENT: Lives with: lives with their family Lives in: House/apartment Stairs: Yes: External: 14 steps; can reach both Has following equipment at home: Single point cane  OCCUPATION: N/A  PLOF: Independent  PATIENT GOALS: Return to ADLs without pain, regain ROM.   NEXT MD VISIT: January 30th    OBJECTIVE:  Note: Objective measures were completed at Evaluation unless otherwise noted.  PATIENT SURVEYS:  FOTO: 40% functional status, predicted  56%  COGNITION: Overall cognitive status: Within functional limits for tasks assessed   EDEMA:  Notable edema in left knee   MUSCLE LENGTH: Assess hamstring tightness next session   POSTURE: No Significant postural limitations  PALPATION: Edema in left knee   LOWER EXTREMITY ROM:  Active ROM Right eval Left eval Left 1/28 Left 1/30 Left 2/3  Hip flexion       Hip extension       Hip abduction       Hip adduction       Hip internal rotation       Hip external rotation       Knee flexion 110 85 100 102 115  Knee extension -2 1  3    Ankle dorsiflexion       Ankle plantarflexion       Ankle inversion       Ankle eversion        (Blank rows = not tested)  LOWER EXTREMITY MMT:  MMT Right eval Left eval  Hip flexion    Hip extension    Hip abduction    Hip adduction    Hip internal rotation    Hip external rotation    Knee flexion    Knee extension    Ankle dorsiflexion    Ankle plantarflexion    Ankle inversion    Ankle eversion     (Blank rows = not tested)  FUNCTIONAL TESTS:  Complete 30 sec sit to stand next session   GAIT: Distance walked: 40 Assistive device utilized: Single point cane Level of assistance: Complete Independence Comments: Antalgic gait                                                                                                                                TREATMENT DATE:  02/05/2023 Therapeutic Exercise:  NuStep x4 min Standing gastroc stretch 5x10"  Seated hamstring stretch 5x10"  Quad set with towel under ankle 2x10"  SLR 2x10 Supine heel slide 2x10"  LAQ 2x10"  Step up 2in box 2x10  Standing heel raises 2x10  Standing slant board calf stretch 2x30"   02/01/2023:  Therapeutic Exercise:  NuStep x3 min  Supine gastroc stretch 2x30"  Quad set with towel under ankle 1x15 Supine heel slides 1x15  SLR 1x15  LAQ 1x15  Sit to stand 2x8  Standing TKE against ball 2x10x5"   01/30/2023  Therapeutic Exercise:  Supine  gastroc stretch 2x30" Quad set with towel under knee 1x15x5"  Quad set with towel under ankle 2x15x5"  SLR 3x5  Supine heel slides 2x8"  Seated heel slides 2x8"  Standing TKE against ball 1x10x5" Manual:  Patellar mobs to left knee   01/25/2023 Therapeutic exercise:  Quad set with towel under knee 1x10x5"  Quad set with towel under ankle 1x10x5"  SLR 1x10  Supine gastroc stretch 2x30"  Supine heel slides 1x10x"  PATIENT EDUCATION:  Education details: HEP.  Person educated: Patient Education method: Explanation, Demonstration, Tactile cues, Verbal cues Education comprehension: verbalized understanding, returned demonstration, verbal cues required, tactile cues required, and needs further education  HOME EXERCISE PROGRAM: Access Code: [email or web address]    ASSESSMENT: CLINICAL IMPRESSION: Patient responded well to therapy today with no adverse effects. Continues to have tightness in the calf and hamstring that is increasing her pain. Patient is improving her ROM, measuring knee flexion at 115 degrees today. Patient continues to have good quad activation, completing LAQs almost to full extension. Gait speed has sped up and continues to WB more evenly on both limbs. Began standing quad loading with step ups today. Pain levels continue to be the limiting factor due to bruising and swelling. Patient would benefit from skilled PT to decrease pain levels, increase ROM, and increase strength to return to functional levels prior to surgery.   EVAL: Patient is a 63 y.o. female who was seen today for physical therapy evaluation and treatment for s/p left TKA on 01/19/2023. Patient responded well to therapy with no adverse effects. Patient has limited ROM in comparison to the right knee due to edema post op. Patient able to activate quad and complete a SLR, continue to progress quad strength in next sessions. Patient walks with a step through pattern at initial session indicating some initial level of quad  strength. Overall, patients pain levels are tolerable during treatment session. FOTO score of 40% indicating a decrease in functional ability following surgery. Patient would benefit from skilled PT to decrease pain levels and increase ROM to return to her functional levels prior to surgery.   OBJECTIVE IMPAIRMENTS: Abnormal gait, decreased ROM, decreased strength, increased edema, and pain.   ACTIVITY LIMITATIONS: sitting, standing, squatting, stairs, and locomotion level  PARTICIPATION LIMITATIONS: community activity  PERSONAL FACTORS: Past/current experiences and Time since onset of injury/illness/exacerbation are also affecting patient's functional outcome.   REHAB POTENTIAL: Good  CLINICAL DECISION MAKING: Stable/uncomplicated  EVALUATION COMPLEXITY: Low   GOALS: Goals reviewed with patient? Yes  SHORT TERM GOALS: Target date: 02/22/2023  Patient will be I with initial HEP in order to progress with therapy. Baseline: Goal status: INITIAL  2.  Patient will score </= 8/10 at worse on the NPS by 3rd visit in order to show an improvement in pain allowing for an increase in functional ability.  Baseline: 10/10 Goal status: INITIAL  LONG TERM GOALS: Target date: 03/22/2023  Patient will be independent with final HEP in order to continue treatment at home for pain tolerance.  Baseline:  Goal status: INITIAL  2.  Patient will score at least 56% on FOTO to signify clinically meaningful improvement in functional abilities.  Baseline: 40% Goal status: INITIAL  3.  Patient will score </= 5/10 at worse on the NPSin order to show an improvement in pain allowing for an increase in functional ability.   Baseline: 10/10 Goal status: INITIAL  4.  Patient will  increase knee flexion ROM by  at least 25 degrees to increase functional ability.  Baseline: 85 deg Goal status: INITIAL  5.  Patient will increase knee strength to a 4/5 to show an increase in quad strength to improve gait and  functional ability. Baseline: 3/5 Goal status: INITIAL   PLAN: PT FREQUENCY: 2x/week  PT DURATION: 8 weeks  PLANNED INTERVENTIONS: 97164- PT Re-evaluation, 97110-Therapeutic exercises, 97530- Therapeutic activity, 97112- Neuromuscular re-education, 97535- Self Care, 95621- Manual therapy, [phone]- Gait training, [phone]- Vasopneumatic device, Patient/Family education, Stair training, Dry Needling, Joint mobilization, Joint manipulation, Spinal manipulation, Spinal mobilization, Cryotherapy, and Moist heat  PLAN FOR NEXT SESSION: review/revise HEP, continuing progressing strengthening, manual therapy to calf when bruising decreases.    Erin Hearing, Student-PT 02/05/2023, 12:08 PM

## 2023-02-06 ENCOUNTER — Other Ambulatory Visit: Payer: Self-pay | Admitting: Orthopaedic Surgery

## 2023-02-06 ENCOUNTER — Telehealth: Payer: Self-pay | Admitting: Orthopaedic Surgery

## 2023-02-06 ENCOUNTER — Telehealth: Payer: Self-pay | Admitting: Orthopedic Surgery

## 2023-02-06 MED ORDER — OXYCODONE HCL 5 MG PO TABS: 5.0000 mg | ORAL_TABLET | Freq: Four times a day (QID) | ORAL | 0 refills | Status: DC | PRN

## 2023-02-06 NOTE — Telephone Encounter (Signed)
Rx refill Oxycodone  Pharmacy:[facility]

## 2023-02-06 NOTE — Telephone Encounter (Signed)
Please send Oxycodone Rx to [facility].  Patient's wife states that [facility] is "always out of the medication".

## 2023-02-08 ENCOUNTER — Encounter: Payer: Self-pay | Admitting: Physical Therapy

## 2023-02-08 ENCOUNTER — Other Ambulatory Visit: Payer: Self-pay

## 2023-02-08 ENCOUNTER — Ambulatory Visit: Payer: Medicaid Other | Admitting: Physical Therapy

## 2023-02-08 DIAGNOSIS — M25562 Pain in left knee: Secondary | ICD-10-CM | POA: Diagnosis not present

## 2023-02-08 DIAGNOSIS — M6281 Muscle weakness (generalized): Secondary | ICD-10-CM

## 2023-02-08 DIAGNOSIS — R2689 Other abnormalities of gait and mobility: Secondary | ICD-10-CM

## 2023-02-08 DIAGNOSIS — R600 Localized edema: Secondary | ICD-10-CM

## 2023-02-08 NOTE — Therapy (Signed)
 OUTPATIENT PHYSICAL THERAPY TREATMENT  Patient Name: Tracy Mercado MRN: [id number] DOB:[birth year], 63 y.o., female Today's Date: 02/08/2023  END OF SESSION:  PT End of Session - 02/08/23 1208     Visit Number 5    Number of Visits 17    Date for PT Re-Evaluation 03/22/23    Authorization Type [employer]    PT Start Time 1101    PT Stop Time 1141    PT Time Calculation (min) 40 min    Activity Tolerance Patient tolerated treatment well    Behavior During Therapy WFL for tasks assessed/performed             Past Medical History:  Diagnosis Date   Arthritis    CAD (coronary artery disease)    Hypertension    Past Surgical History:  Procedure Laterality Date   HAND SURGERY Left    KNEE ARTHROSCOPY     TOTAL KNEE ARTHROPLASTY Right 04/05/2021   Procedure: Right TOTAL KNEE ARTHROPLASTY;  Surgeon: Vernetta Lonni CINDERELLA, MD;  Location: [facility];  Service: Orthopedics;  Laterality: Right;   TOTAL KNEE ARTHROPLASTY Left 01/19/2023   Procedure: LEFT TOTAL KNEE ARTHROPLASTY;  Surgeon: Vernetta Lonni CINDERELLA, MD;  Location: [facility];  Service: Orthopedics;  Laterality: Left;   Patient Active Problem List   Diagnosis Date Noted   Status post total left knee replacement 01/19/2023   Unilateral primary osteoarthritis, left knee 12/04/2022   Status post right knee replacement 04/05/2021   PCP: N/A  REFERRING PROVIDER: Vernetta Lonni CINDERELLA, MD  REFERRING DIAG: [phone] (ICD-10-CM) - Status post left knee replacement   THERAPY DIAG:  Acute pain of left knee  Muscle weakness (generalized)  Other abnormalities of gait and mobility  Localized edema  Rationale for Evaluation and Treatment: Rehabilitation  ONSET DATE: 01/19/2023  SUBJECTIVE:   SUBJECTIVE STATEMENT: Patient comes in reporting that she feels okay since her last session. Her swelling and bruising are still her biggest contributing factor to her pain. Her bruising is turning yellow and starting to heal. Her pain  was a 4/10 after leaving last time which shows improvement. Took two of her pain medications at 9:30 before her session. Compliant with HEP.   EVAL: Patient comes in with daughter 6 days post-op left TKA. Patient reports pain is most limiting factor to knee motion right now. She reports that she is using the ice 2x3 times a day but not currently elevating the knee. She has no issues sleeping when taking the pain medication which she is taking on a consistent basis. Her last intake of the oxycodone  was at 5 AM and she took 800 mlg of ibuprofen  at 8 AM. Reports that her nerve block has worn off. She had a TKA on the R knee last year and she reports the L knee feels better initially after surgery. Her walking is going well and she is able to do stairs with both legs on each stair. She reports she was sent home with a brace but has not been wearing it. Overall, she feels she is in a good place for being a week out of surgery.  PERTINENT HISTORY: L TKA 01/19/2023 R TKA 04/05/2021  PAIN:  Are you having pain? Yes: NPRS scale: 7/10 current, 10/10 worst.  Pain location: left knee Pain description: sharp, throbbing, achy Aggravating factors: everything  Relieving factors: ice and pain medication  PRECAUTIONS: Knee and Fall  RED FLAGS: None   WEIGHT BEARING RESTRICTIONS:  WBAT  FALLS:  Has patient fallen  in last 6 months? No  LIVING ENVIRONMENT: Lives with: lives with their family Lives in: House/apartment Stairs: Yes: External: 14 steps; can reach both Has following equipment at home: Single point cane  OCCUPATION: N/A  PLOF: Independent  PATIENT GOALS: Return to ADLs without pain, regain ROM.   NEXT MD VISIT: January 30th   OBJECTIVE:  Note: Objective measures were completed at Evaluation unless otherwise noted.  PATIENT SURVEYS:  FOTO: 40% functional status, predicted 56%  COGNITION: Overall cognitive status: Within functional limits for tasks assessed   EDEMA:  Notable edema  in left knee   MUSCLE LENGTH: Assess hamstring tightness next session   POSTURE: No Significant postural limitations  PALPATION: Edema in left knee   LOWER EXTREMITY ROM:  Active ROM Right eval Left eval Left 1/28 Left 1/30 Left 2/3  Hip flexion       Hip extension       Hip abduction       Hip adduction       Hip internal rotation       Hip external rotation       Knee flexion 110 85 100 102 115  Knee extension -2 1  3    Ankle dorsiflexion       Ankle plantarflexion       Ankle inversion       Ankle eversion        (Blank rows = not tested)  LOWER EXTREMITY MMT:  MMT Right eval Left eval  Hip flexion    Hip extension    Hip abduction    Hip adduction    Hip internal rotation    Hip external rotation    Knee flexion    Knee extension    Ankle dorsiflexion    Ankle plantarflexion    Ankle inversion    Ankle eversion     (Blank rows = not tested)  FUNCTIONAL TESTS:  Complete 30 sec sit to stand next session   GAIT: Distance walked: 40 Assistive device utilized: Single point cane Level of assistance: Complete Independence Comments: Antalgic gait                                                                                                                                TREATMENT DATE:  02/08/2023 Seated bike x4 min full ROM  Standing mini squats at bar 2x10  Quad set with towel under ankle 1x15  SLR 2x10 #2  Hip flexor rainbows over dumbbell x10  Supine heel slides 2x10  Seated heel raises #15 x15  Standing TKE against ball 1x15x5 Gait training x5 min working on heel to toe gait and knee extension  Slant board x1 min   02/05/2023 Therapeutic Exercise:  NuStep x4 min Standing gastroc stretch 5x10  Seated hamstring stretch 5x10  Quad set with towel under ankle 2x10  SLR 2x10 Supine heel slide 2x10  LAQ 2x10  Step up 2in box 2x10  Standing heel  raises 2x10  Standing slant board calf stretch 2x30   02/01/2023:  Therapeutic Exercise:   NuStep x3 min  Supine gastroc stretch 2x30  Quad set with towel under ankle 1x15 Supine heel slides 1x15  SLR 1x15  LAQ 1x15  Sit to stand 2x8  Standing TKE against ball 2x10x5   01/30/2023  Therapeutic Exercise:  Supine gastroc stretch 2x30 Quad set with towel under knee 1x15x5  Quad set with towel under ankle 2x15x5  SLR 3x5  Supine heel slides 2x8  Seated heel slides 2x8  Standing TKE against ball 1x10x5 Manual:  Patellar mobs to left knee   01/25/2023 Therapeutic exercise:  Quad set with towel under knee 1x10x5  Quad set with towel under ankle 1x10x5  SLR 1x10  Supine gastroc stretch 2x30  Supine heel slides 1x10x  PATIENT EDUCATION:  Education details: HEP.  Person educated: Patient Education method: Explanation, Demonstration, Tactile cues, Verbal cues Education comprehension: verbalized understanding, returned demonstration, verbal cues required, tactile cues required, and needs further education  HOME EXERCISE PROGRAM: Access Code: [email or web address]   ASSESSMENT: CLINICAL IMPRESSION: Patient responded well to therapy today with no adverse effects. Continues to have tightness in the calf which is bothering her the most, unable to do manual therapy due to bruising and was reminded to try and continue doing the stretches at home. Continues to improve in ROM, was able to complete a full circle on the seated bike without pain. Progressed strengthening today with mini squats, hip flexor, and calf strengthening added. Gait training added today to work on heel to toe gait pattern with extension. Overall, strength and ROM deficits continue to impact functional ability s/p left TKA. Patient would benefit from skilled PT to decrease pain levels, increase ROM, and increase strength to return to functional levels prior to surgery.   EVAL: Patient is a 63 y.o. female who was seen today for physical therapy evaluation and treatment for s/p left TKA on 01/19/2023. Patient  responded well to therapy with no adverse effects. Patient has limited ROM in comparison to the right knee due to edema post op. Patient able to activate quad and complete a SLR, continue to progress quad strength in next sessions. Patient walks with a step through pattern at initial session indicating some initial level of quad strength. Overall, patients pain levels are tolerable during treatment session. FOTO score of 40% indicating a decrease in functional ability following surgery. Patient would benefit from skilled PT to decrease pain levels and increase ROM to return to her functional levels prior to surgery.   OBJECTIVE IMPAIRMENTS: Abnormal gait, decreased ROM, decreased strength, increased edema, and pain.   ACTIVITY LIMITATIONS: sitting, standing, squatting, stairs, and locomotion level  PARTICIPATION LIMITATIONS: community activity  PERSONAL FACTORS: Past/current experiences and Time since onset of injury/illness/exacerbation are also affecting patient's functional outcome.   REHAB POTENTIAL: Good  CLINICAL DECISION MAKING: Stable/uncomplicated  EVALUATION COMPLEXITY: Low   GOALS: Goals reviewed with patient? Yes  SHORT TERM GOALS: Target date: 02/22/2023  Patient will be I with initial HEP in order to progress with therapy. Baseline: Goal status: INITIAL  2.  Patient will score </= 8/10 at worse on the NPS by 3rd visit in order to show an improvement in pain allowing for an increase in functional ability.  Baseline: 10/10 Goal status: INITIAL  LONG TERM GOALS: Target date: 03/22/2023  Patient will be independent with final HEP in order to continue treatment at home for pain tolerance.  Baseline:  Goal status: INITIAL  2.  Patient will score at least 56% on FOTO to signify clinically meaningful improvement in functional abilities.  Baseline: 40% Goal status: INITIAL  3.  Patient will score </= 5/10 at worse on the NPSin order to show an improvement in pain allowing  for an increase in functional ability.   Baseline: 10/10 Goal status: INITIAL  4.  Patient will increase knee flexion ROM by  at least 25 degrees to increase functional ability.  Baseline: 85 deg Goal status: INITIAL  5.  Patient will increase knee strength to a 4/5 to show an increase in quad strength to improve gait and functional ability. Baseline: 3/5 Goal status: INITIAL  PLAN: PT FREQUENCY: 2x/week  PT DURATION: 8 weeks  PLANNED INTERVENTIONS: 97164- PT Re-evaluation, 97110-Therapeutic exercises, 97530- Therapeutic activity, 97112- Neuromuscular re-education, 97535- Self Care, [id number]- Manual therapy, [phone]- Gait training, [phone]- Vasopneumatic device, Patient/Family education, Stair training, Dry Needling, Joint mobilization, Joint manipulation, Spinal manipulation, Spinal mobilization, Cryotherapy, and Moist heat  PLAN FOR NEXT SESSION: review/revise HEP, continuing progressing strengthening, manual therapy to calf when bruising decreases.    Kent Hummer, Student-PT 02/08/2023, 12:42 PM

## 2023-02-09 ENCOUNTER — Telehealth: Payer: Self-pay | Admitting: Orthopaedic Surgery

## 2023-02-09 ENCOUNTER — Other Ambulatory Visit: Payer: Self-pay | Admitting: Orthopaedic Surgery

## 2023-02-09 MED ORDER — OXYCODONE HCL 5 MG PO TABS: 5.0000 mg | ORAL_TABLET | Freq: Four times a day (QID) | ORAL | 0 refills | Status: DC | PRN

## 2023-02-09 NOTE — Telephone Encounter (Signed)
 Patient would like a refill on her pain medication sent to [facility] pharmacy.

## 2023-02-12 ENCOUNTER — Ambulatory Visit: Payer: Medicaid Other

## 2023-02-12 ENCOUNTER — Other Ambulatory Visit: Payer: Self-pay

## 2023-02-12 DIAGNOSIS — M25562 Pain in left knee: Secondary | ICD-10-CM | POA: Diagnosis not present

## 2023-02-12 DIAGNOSIS — M6281 Muscle weakness (generalized): Secondary | ICD-10-CM

## 2023-02-12 DIAGNOSIS — R600 Localized edema: Secondary | ICD-10-CM

## 2023-02-12 DIAGNOSIS — R2689 Other abnormalities of gait and mobility: Secondary | ICD-10-CM

## 2023-02-12 NOTE — Therapy (Addendum)
 OUTPATIENT PHYSICAL THERAPY TREATMENT  Patient Name: Tracy Mercado MRN: [id number] DOB:05-06-1960, 63 y.o., female Today's Date: 02/12/2023  END OF SESSION:  PT End of Session - 02/12/23 1053     Visit Number 6    Number of Visits 17    Date for PT Re-Evaluation 03/22/23    Authorization Type [facility] [employer]    PT Start Time 1100    PT Stop Time 1145    PT Time Calculation (min) 45 min    Activity Tolerance Patient tolerated treatment well    Behavior During Therapy WFL for tasks assessed/performed             Past Medical History:  Diagnosis Date   Arthritis    CAD (coronary artery disease)    Hypertension    Past Surgical History:  Procedure Laterality Date   HAND SURGERY Left    KNEE ARTHROSCOPY     TOTAL KNEE ARTHROPLASTY Right 04/05/2021   Procedure: Right TOTAL KNEE ARTHROPLASTY;  Surgeon: Arnie Lao, MD;  Location: [facility];  Service: Orthopedics;  Laterality: Right;   TOTAL KNEE ARTHROPLASTY Left 01/19/2023   Procedure: LEFT TOTAL KNEE ARTHROPLASTY;  Surgeon: Arnie Lao, MD;  Location: [facility];  Service: Orthopedics;  Laterality: Left;   Patient Active Problem List   Diagnosis Date Noted   Status post total left knee replacement 01/19/2023   Unilateral primary osteoarthritis, left knee 12/04/2022   Status post right knee replacement 04/05/2021   PCP: N/A  REFERRING PROVIDER: Arnie Lao, MD  REFERRING DIAG: [phone] (ICD-10-CM) - Status post left knee replacement   THERAPY DIAG:  Acute pain of left knee  Muscle weakness (generalized)  Other abnormalities of gait and mobility  Localized edema  Rationale for Evaluation and Treatment: Rehabilitation  ONSET DATE: 01/19/2023  SUBJECTIVE:   SUBJECTIVE STATEMENT: Patient comes in reporting higher pain levels than normal at about a 7/10. She reports that at about 3 am this morning she woke up with neural pain that went into her foot. Still has the most pain around the back  of her knee and the bottom of the knee cap. Took her pain medication at 9:10 am before treatment. Reports she left treatment last time and had a 4/10 pain at highest which also shows improvement.   EVAL: Patient comes in with daughter 6 days post-op left TKA. Patient reports pain is most limiting factor to knee motion right now. She reports that she is using the ice 2x3 times a day but not currently elevating the knee. She has no issues sleeping when taking the pain medication which she is taking on a consistent basis. Her last intake of the oxycodone  was at 5 AM and she took 800 mlg of ibuprofen  at 8 AM. Reports that her nerve block has worn off. She had a TKA on the R knee last year and she reports the L knee feels better initially after surgery. Her walking is going well and she is able to do stairs with both legs on each stair. She reports she was sent home with a brace but has not been wearing it. Overall, she feels she is in a good place for being a week out of surgery.  PERTINENT HISTORY: L TKA 01/19/2023 R TKA 04/05/2021  PAIN:  Are you having pain? Yes: NPRS scale: 7/10 current, 10/10 worst.  Pain location: left knee Pain description: sharp, throbbing, achy Aggravating factors: everything  Relieving factors: ice and pain medication  PRECAUTIONS: Knee and Fall  RED FLAGS: None   WEIGHT BEARING RESTRICTIONS:  WBAT  FALLS:  Has patient fallen in last 6 months? No  LIVING ENVIRONMENT: Lives with: lives with their family Lives in: House/apartment Stairs: Yes: External: 14 steps; can reach both Has following equipment at home: Single point cane  OCCUPATION: N/A  PLOF: Independent  PATIENT GOALS: Return to ADLs without pain, regain ROM.   NEXT MD VISIT: January 30th   OBJECTIVE:  Note: Objective measures were completed at Evaluation unless otherwise noted.  PATIENT SURVEYS:  FOTO: 40% functional status, predicted 56%  COGNITION: Overall cognitive status: Within functional  limits for tasks assessed   EDEMA:  Notable edema in left knee   MUSCLE LENGTH: Assess hamstring tightness next session   POSTURE: No Significant postural limitations  PALPATION: Edema in left knee   LOWER EXTREMITY ROM:  Active ROM Right eval Left eval Left 1/28 Left 1/30 Left 2/3  Hip flexion       Hip extension       Hip abduction       Hip adduction       Hip internal rotation       Hip external rotation       Knee flexion 110 85 100 102 115  Knee extension -2 1  3  0  Ankle dorsiflexion       Ankle plantarflexion       Ankle inversion       Ankle eversion        (Blank rows = not tested)  LOWER EXTREMITY MMT:  MMT Right eval Left eval  Hip flexion    Hip extension    Hip abduction    Hip adduction    Hip internal rotation    Hip external rotation    Knee flexion    Knee extension    Ankle dorsiflexion    Ankle plantarflexion    Ankle inversion    Ankle eversion     (Blank rows = not tested)  FUNCTIONAL TESTS:  30 sec sit-to-stand:  02/12/2023: 13 reps at elevated surface.   GAIT: Distance walked: 40 Assistive device utilized: Single point cane Level of assistance: Complete Independence Comments: Antalgic gait                                                                                                        TREATMENT DATE:  02/12/2023 Seated bike x4 min full ROM  SAQ off bolster 2x10x3"  SLR 2x10 #2  Hip flexor rainbows over dumbbell 2x10 Standing mini squats at bar 2x10 Weight shift x2 min x2 (fwd/bwd)  Sidelying hip abduction 2x10 BL Manual Therapy:  Patellar mobs medial and lateral, superior and inferior   02/08/2023 Seated bike x4 min full ROM  Standing mini squats at bar 2x10  Quad set with towel under ankle 1x15  SLR 2x10 #2  Hip flexor rainbows over dumbbell x10  Supine heel slides 2x10  Seated heel raises #15 x15  Standing TKE against ball 1x15x5" Gait training x5 min working on heel to toe gait and knee extension  Slant  board x1 min   02/05/2023 Therapeutic Exercise:  NuStep x4 min Standing gastroc stretch 5x10"  Seated hamstring stretch 5x10"  Quad set with towel under ankle 2x10"  SLR 2x10 Supine heel slide 2x10"  LAQ 2x10"  Step up 2in box 2x10  Standing heel raises 2x10  Standing slant board calf stretch 2x30"   02/01/2023:  Therapeutic Exercise:  NuStep x3 min  Supine gastroc stretch 2x30"  Quad set with towel under ankle 1x15 Supine heel slides 1x15  SLR 1x15  LAQ 1x15  Sit to stand 2x8  Standing TKE against ball 2x10x5"   01/30/2023  Therapeutic Exercise:  Supine gastroc stretch 2x30" Quad set with towel under knee 1x15x5"  Quad set with towel under ankle 2x15x5"  SLR 3x5  Supine heel slides 2x8"  Seated heel slides 2x8"  Standing TKE against ball 1x10x5" Manual:  Patellar mobs to left knee   01/25/2023 Therapeutic exercise:  Quad set with towel under knee 1x10x5"  Quad set with towel under ankle 1x10x5"  SLR 1x10  Supine gastroc stretch 2x30"  Supine heel slides 1x10x"  PATIENT EDUCATION:  Education details: HEP.  Person educated: Patient Education method: Explanation, Demonstration, Tactile cues, Verbal cues Education comprehension: verbalized understanding, returned demonstration, verbal cues required, tactile cues required, and needs further education  HOME EXERCISE PROGRAM: Access Code: [email or web address]   ASSESSMENT: CLINICAL IMPRESSION: Patient responded well to therapy today with no adverse effects. Calf tightness continues, manual therapy still unable to be completed due to bruising. Patellar mobs done today which decreased tightness in kneecap. Pain levels were higher than normal arriving at clinic but did not increase during the session. Overall, patient continued to progress strengthening being able to complete a sit-to-stand on an elevated surface. 30 sec sit-to-stand measure performed and patient scored a 13, landing in her age related norms. Continued to progress  quad activation and glute strengthening. Changes made to HEP today. Next session, continue to progress sit-to-stands towards a 90 deg knee bend and start to incorporate eccentric quad strength. Overall, strength and ROM deficits continue to impact functional ability s/p left TKA. Patient would benefit from skilled PT to decrease pain levels, increase ROM, and increase strength to return to functional levels prior to surgery.   EVAL: Patient is a 63 y.o. female who was seen today for physical therapy evaluation and treatment for s/p left TKA on 01/19/2023. Patient responded well to therapy with no adverse effects. Patient has limited ROM in comparison to the right knee due to edema post op. Patient able to activate quad and complete a SLR, continue to progress quad strength in next sessions. Patient walks with a step through pattern at initial session indicating some initial level of quad strength. Overall, patients pain levels are tolerable during treatment session. FOTO score of 40% indicating a decrease in functional ability following surgery. Patient would benefit from skilled PT to decrease pain levels and increase ROM to return to her functional levels prior to surgery.   OBJECTIVE IMPAIRMENTS: Abnormal gait, decreased ROM, decreased strength, increased edema, and pain.   ACTIVITY LIMITATIONS: sitting, standing, squatting, stairs, and locomotion level  PARTICIPATION LIMITATIONS: community activity  PERSONAL FACTORS: Past/current experiences and Time since onset of injury/illness/exacerbation are also affecting patient's functional outcome.   REHAB POTENTIAL: Good  CLINICAL DECISION MAKING: Stable/uncomplicated  EVALUATION COMPLEXITY: Low   GOALS: Goals reviewed with patient? Yes  SHORT TERM GOALS: Target date: 02/22/2023  Patient will be I with initial HEP in order to progress with therapy. Baseline:  Goal status: INITIAL  2.  Patient will score </= 8/10 at worse on the NPS by 3rd visit  in order to show an improvement in pain allowing for an increase in functional ability.  Baseline: 10/10 Goal status: INITIAL  LONG TERM GOALS: Target date: 03/22/2023  Patient will be independent with final HEP in order to continue treatment at home for pain tolerance.  Baseline:  Goal status: INITIAL  2.  Patient will score at least 56% on FOTO to signify clinically meaningful improvement in functional abilities.  Baseline: 40% Goal status: INITIAL  3.  Patient will score </= 5/10 at worse on the NPSin order to show an improvement in pain allowing for an increase in functional ability.   Baseline: 10/10 Goal status: INITIAL  4.  Patient will increase knee flexion ROM by  at least 25 degrees to increase functional ability.  Baseline: 85 deg Goal status: INITIAL  5.  Patient will increase knee strength to a 4/5 to show an increase in quad strength to improve gait and functional ability. Baseline: 3/5 Goal status: INITIAL  PLAN: PT FREQUENCY: 2x/week  PT DURATION: 8 weeks  PLANNED INTERVENTIONS: 97164- PT Re-evaluation, 97110-Therapeutic exercises, 97530- Therapeutic activity, 97112- Neuromuscular re-education, 97535- Self Care, 16109- Manual therapy, [phone]- Gait training, [phone]- Vasopneumatic device, Patient/Family education, Stair training, Dry Needling, Joint mobilization, Joint manipulation, Spinal manipulation, Spinal mobilization, Cryotherapy, and Moist heat  PLAN FOR NEXT SESSION: review/revise HEP, continuing progressing strengthening, manual therapy to calf when bruising decreases, eccentric quad strength.    Jesse Moritz, Student-PT 02/12/2023, 1:04 PM

## 2023-02-13 ENCOUNTER — Telehealth: Payer: Self-pay | Admitting: Orthopaedic Surgery

## 2023-02-13 ENCOUNTER — Other Ambulatory Visit: Payer: Self-pay | Admitting: Orthopaedic Surgery

## 2023-02-13 MED ORDER — OXYCODONE HCL 5 MG PO TABS
5.0000 mg | ORAL_TABLET | Freq: Four times a day (QID) | ORAL | 0 refills | Status: DC | PRN
Start: 2023-02-13 — End: 2023-02-16

## 2023-02-13 NOTE — Telephone Encounter (Signed)
Pt's wife Harriett Sine called requesting refill of oxycodone. To go to [facility]. Pt phone number is [phone].

## 2023-02-15 ENCOUNTER — Other Ambulatory Visit: Payer: Self-pay

## 2023-02-15 ENCOUNTER — Ambulatory Visit: Payer: Medicaid Other

## 2023-02-15 DIAGNOSIS — M6281 Muscle weakness (generalized): Secondary | ICD-10-CM

## 2023-02-15 DIAGNOSIS — M25562 Pain in left knee: Secondary | ICD-10-CM

## 2023-02-15 DIAGNOSIS — R2689 Other abnormalities of gait and mobility: Secondary | ICD-10-CM

## 2023-02-15 DIAGNOSIS — R600 Localized edema: Secondary | ICD-10-CM

## 2023-02-15 NOTE — Therapy (Addendum)
OUTPATIENT PHYSICAL THERAPY TREATMENT  Patient Name: Tracy Mercado MRN: [id number] DOB:1960/08/06, 63 y.o., female Today's Date: 02/15/2023  END OF SESSION:  PT End of Session - 02/15/23 1154     Visit Number 7    Number of Visits 17    Date for PT Re-Evaluation 03/22/23    Authorization Type [employer]    PT Start Time 1100    PT Stop Time 1148    PT Time Calculation (min) 48 min    Activity Tolerance Patient tolerated treatment well    Behavior During Therapy WFL for tasks assessed/performed            Past Medical History:  Diagnosis Date   Arthritis    CAD (coronary artery disease)    Hypertension    Past Surgical History:  Procedure Laterality Date   HAND SURGERY Left    KNEE ARTHROSCOPY     TOTAL KNEE ARTHROPLASTY Right 04/05/2021   Procedure: Right TOTAL KNEE ARTHROPLASTY;  Surgeon: Kathryne Hitch, MD;  Location: [facility];  Service: Orthopedics;  Laterality: Right;   TOTAL KNEE ARTHROPLASTY Left 01/19/2023   Procedure: LEFT TOTAL KNEE ARTHROPLASTY;  Surgeon: Kathryne Hitch, MD;  Location: [facility];  Service: Orthopedics;  Laterality: Left;   Patient Active Problem List   Diagnosis Date Noted   Status post total left knee replacement 01/19/2023   Unilateral primary osteoarthritis, left knee 12/04/2022   Status post right knee replacement 04/05/2021   PCP: N/A  REFERRING PROVIDER: Kathryne Hitch, MD  REFERRING DIAG: [phone] (ICD-10-CM) - Status post left knee replacement   THERAPY DIAG:  Acute pain of left knee  Other abnormalities of gait and mobility  Muscle weakness (generalized)  Localized edema  Rationale for Evaluation and Treatment: Rehabilitation  ONSET DATE: 01/19/2023  SUBJECTIVE:   SUBJECTIVE STATEMENT: Patient comes in stating she is feeling okay but not great. She is tired of coming to PT but is ready to start today. She rates her pain a 3/10 coming in today and took her pain medication at 9:30. She feels like the  brusing is getting better.   EVAL: Patient comes in with daughter 6 days post-op left TKA. Patient reports pain is most limiting factor to knee motion right now. She reports that she is using the ice 2x3 times a day but not currently elevating the knee. She has no issues sleeping when taking the pain medication which she is taking on a consistent basis. Her last intake of the oxycodone was at 5 AM and she took 800 mlg of ibuprofen at 8 AM. Reports that her nerve block has worn off. She had a TKA on the R knee last year and she reports the L knee feels better initially after surgery. Her walking is going well and she is able to do stairs with both legs on each stair. She reports she was sent home with a brace but has not been wearing it. Overall, she feels she is in a good place for being a week out of surgery.  PERTINENT HISTORY: L TKA 01/19/2023 R TKA 04/05/2021  PAIN:  Are you having pain? Yes: NPRS scale: 7/10 current, 10/10 worst.  Pain location: left knee Pain description: sharp, throbbing, achy Aggravating factors: everything  Relieving factors: ice and pain medication  PRECAUTIONS: Knee and Fall  RED FLAGS: None   WEIGHT BEARING RESTRICTIONS:  WBAT  FALLS:  Has patient fallen in last 6 months? No  LIVING ENVIRONMENT: Lives with: lives with their  family Lives in: House/apartment Stairs: Yes: External: 14 steps; can reach both Has following equipment at home: Single point cane  OCCUPATION: N/A  PLOF: Independent  PATIENT GOALS: Return to ADLs without pain, regain ROM.   NEXT MD VISIT: January 30th   OBJECTIVE:  Note: Objective measures were completed at Evaluation unless otherwise noted.  PATIENT SURVEYS:  FOTO: 40% functional status, predicted 56%  COGNITION: Overall cognitive status: Within functional limits for tasks assessed   EDEMA:  Notable edema in left knee   MUSCLE LENGTH: Assess hamstring tightness next session   POSTURE: No Significant postural  limitations  PALPATION: Edema in left knee   LOWER EXTREMITY ROM:  Active ROM Right eval Left eval Left 1/28 Left 1/30 Left 2/3 Left 2/13  Hip flexion        Hip extension        Hip abduction        Hip adduction        Hip internal rotation        Hip external rotation        Knee flexion 110 85 100 102 115 106  Knee extension -2 1  3  0 1  Ankle dorsiflexion        Ankle plantarflexion        Ankle inversion        Ankle eversion         (Blank rows = not tested)  LOWER EXTREMITY MMT:  MMT Right eval Left eval  Hip flexion    Hip extension    Hip abduction    Hip adduction    Hip internal rotation    Hip external rotation    Knee flexion    Knee extension    Ankle dorsiflexion    Ankle plantarflexion    Ankle inversion    Ankle eversion     (Blank rows = not tested)  FUNCTIONAL TESTS:  30 sec sit-to-stand:  02/12/2023: 13 reps at elevated surface.   GAIT: Distance walked: 40 Assistive device utilized: Single point cane Level of assistance: Complete Independence Comments: Antalgic gait                                                                                                        TREATMENT DATE:  02/15/2023  Seated bike x4 min full ROM  Slant board 2x30"  Supine quad sets towel at ankle 1x15x5" Supine heel slides 1x15x5" SLR 2X10 #2.5 LAQ 2x10 #2.5  Sidelying clam GTB 2x10 BL Standing mini squats at bar x15 Standing SLS balance on left 5x10 sec with one finger assist  Manual Therapy:  Patellar mobs medial and lateral, superior and inferior  STM to right calf   02/12/2023 Seated bike x4 min full ROM  SAQ off bolster 2x10x3"  SLR 2x10 #2  Hip flexor rainbows over dumbbell 2x10 Standing mini squats at bar 2x10 Weight shift x2 min x2 (fwd/bwd)  Sidelying hip abduction 2x10 BL Manual Therapy:  Patellar mobs medial and lateral, superior and inferior   02/08/2023 Seated bike x4 min full ROM  Standing mini squats at bar 2x10  Quad set with  towel under ankle 1x15  SLR 2x10 #2  Hip flexor rainbows over dumbbell x10  Supine heel slides 2x10  Seated heel raises #15 x15  Standing TKE against ball 1x15x5" Gait training x5 min working on heel to toe gait and knee extension  Slant board x1 min   PATIENT EDUCATION:  Education details: HEP.  Person educated: Patient Education method: Explanation, Demonstration, Tactile cues, Verbal cues Education comprehension: verbalized understanding, returned demonstration, verbal cues required, tactile cues required, and needs further education  HOME EXERCISE PROGRAM: Access Code: [email or web address]   ASSESSMENT: CLINICAL IMPRESSION: Patient responded well to therapy today with no adverse effects. Calf tightness continues, manual therapy to calf completed today. Patellar mobs done today which decreased tightness in kneecap. Patella seems to be moving better today. Patient continues to improve in overall pain but it is still the largest determining factor in her progress. Patient continues to be ahead of schedule in ROM and her quad activation looks great. Continued to progress quad and glute strengthening. Continued to work on [employer] with standing squats. Overall, strength and ROM deficits continue to impact functional ability s/p left TKA. Pain management continues to be a large goal. Patient would benefit from skilled PT to decrease pain levels, increase ROM, and increase strength to return to functional levels prior to surgery. Discussed POC and patient will schedule more appointments for the following weeks.   EVAL: Patient is a 63 y.o. female who was seen today for physical therapy evaluation and treatment for s/p left TKA on 01/19/2023. Patient responded well to therapy with no adverse effects. Patient has limited ROM in comparison to the right knee due to edema post op. Patient able to activate quad and complete a SLR, continue to progress quad strength in next sessions. Patient walks with a step through  pattern at initial session indicating some initial level of quad strength. Overall, patients pain levels are tolerable during treatment session. FOTO score of 40% indicating a decrease in functional ability following surgery. Patient would benefit from skilled PT to decrease pain levels and increase ROM to return to her functional levels prior to surgery.   OBJECTIVE IMPAIRMENTS: Abnormal gait, decreased ROM, decreased strength, increased edema, and pain.   ACTIVITY LIMITATIONS: sitting, standing, squatting, stairs, and locomotion level  PARTICIPATION LIMITATIONS: community activity  PERSONAL FACTORS: Past/current experiences and Time since onset of injury/illness/exacerbation are also affecting patient's functional outcome.   REHAB POTENTIAL: Good  CLINICAL DECISION MAKING: Stable/uncomplicated  EVALUATION COMPLEXITY: Low   GOALS: Goals reviewed with patient? Yes  SHORT TERM GOALS: Target date: 02/22/2023  Patient will be I with initial HEP in order to progress with therapy. Baseline: Goal status: INITIAL  2.  Patient will score </= 8/10 at worse on the NPS by 3rd visit in order to show an improvement in pain allowing for an increase in functional ability.  Baseline: 10/10 02/15/2023: 8/10  Goal status: MET  LONG TERM GOALS: Target date: 03/22/2023  Patient will be independent with final HEP in order to continue treatment at home for pain tolerance.  Baseline:  Goal status: INITIAL  2.  Patient will score at least 56% on FOTO to signify clinically meaningful improvement in functional abilities.  Baseline: 40% Goal status: INITIAL  3.  Patient will score </= 5/10 at worse on the NPSin order to show an improvement in pain allowing for an increase in functional ability.   Baseline: 10/10 Goal status: INITIAL  4.  Patient will increase knee flexion ROM by  at least 25 degrees to increase functional ability.  Baseline: 85 deg 02/15/2023 Goal status: INITIAL  5.  Patient  will increase knee strength to a 4/5 to show an increase in quad strength to improve gait and functional ability. Baseline: 3/5 Goal status: INITIAL  PLAN: PT FREQUENCY: 2x/week  PT DURATION: 8 weeks  PLANNED INTERVENTIONS: 97164- PT Re-evaluation, 97110-Therapeutic exercises, 97530- Therapeutic activity, 97112- Neuromuscular re-education, 97535- Self Care, 16109- Manual therapy, [phone]- Gait training, [phone]- Vasopneumatic device, Patient/Family education, Stair training, Dry Needling, Joint mobilization, Joint manipulation, Spinal manipulation, Spinal mobilization, Cryotherapy, and Moist heat  PLAN FOR NEXT SESSION: review/revise HEP, continuing progressing strengthening, manual therapy to calf when bruising decreases, eccentric quad strength.    Erin Hearing, Student-PT 02/15/2023, 12:03 PM

## 2023-02-16 ENCOUNTER — Other Ambulatory Visit: Payer: Self-pay | Admitting: Orthopaedic Surgery

## 2023-02-16 ENCOUNTER — Telehealth: Payer: Self-pay | Admitting: Orthopaedic Surgery

## 2023-02-16 MED ORDER — OXYCODONE HCL 5 MG PO TABS
5.0000 mg | ORAL_TABLET | Freq: Four times a day (QID) | ORAL | 0 refills | Status: DC | PRN
Start: 2023-02-16 — End: 2023-02-21

## 2023-02-16 NOTE — Telephone Encounter (Signed)
Patient aware of the below message

## 2023-02-16 NOTE — Telephone Encounter (Signed)
Pt's wife called for a refill of oxycodone. Pt last script 2/11. Pt's wife states pt take 2 every 6 hours. Please send to pharmacy on file. Pt phone number is [phone].

## 2023-02-21 ENCOUNTER — Telehealth: Payer: Self-pay | Admitting: Orthopaedic Surgery

## 2023-02-21 ENCOUNTER — Other Ambulatory Visit: Payer: Self-pay | Admitting: Orthopaedic Surgery

## 2023-02-21 ENCOUNTER — Other Ambulatory Visit: Payer: Self-pay

## 2023-02-21 ENCOUNTER — Ambulatory Visit: Payer: Medicaid Other

## 2023-02-21 DIAGNOSIS — M25562 Pain in left knee: Secondary | ICD-10-CM | POA: Diagnosis not present

## 2023-02-21 DIAGNOSIS — M6281 Muscle weakness (generalized): Secondary | ICD-10-CM

## 2023-02-21 DIAGNOSIS — R2689 Other abnormalities of gait and mobility: Secondary | ICD-10-CM

## 2023-02-21 DIAGNOSIS — R600 Localized edema: Secondary | ICD-10-CM

## 2023-02-21 MED ORDER — OXYCODONE HCL 5 MG PO TABS: 5.0000 mg | ORAL_TABLET | Freq: Three times a day (TID) | ORAL | 0 refills | Status: DC | PRN

## 2023-02-21 NOTE — Therapy (Signed)
OUTPATIENT PHYSICAL THERAPY TREATMENT  Patient Name: Tracy Mercado MRN: [id number] DOB:04/03/1960, 63 y.o., female Today's Date: 02/21/2023  END OF SESSION:  PT End of Session - 02/21/23 1056     Visit Number 8    Number of Visits 17    Date for PT Re-Evaluation 03/22/23    Authorization Type [employer]    PT Start Time 0932    PT Stop Time 1015    PT Time Calculation (min) 43 min    Activity Tolerance Patient tolerated treatment well    Behavior During Therapy WFL for tasks assessed/performed            Past Medical History:  Diagnosis Date   Arthritis    CAD (coronary artery disease)    Hypertension    Past Surgical History:  Procedure Laterality Date   HAND SURGERY Left    KNEE ARTHROSCOPY     TOTAL KNEE ARTHROPLASTY Right 04/05/2021   Procedure: Right TOTAL KNEE ARTHROPLASTY;  Surgeon: Kathryne Hitch, MD;  Location: [facility];  Service: Orthopedics;  Laterality: Right;   TOTAL KNEE ARTHROPLASTY Left 01/19/2023   Procedure: LEFT TOTAL KNEE ARTHROPLASTY;  Surgeon: Kathryne Hitch, MD;  Location: [facility];  Service: Orthopedics;  Laterality: Left;   Patient Active Problem List   Diagnosis Date Noted   Status post total left knee replacement 01/19/2023   Unilateral primary osteoarthritis, left knee 12/04/2022   Status post right knee replacement 04/05/2021   PCP: N/A  REFERRING PROVIDER: Kathryne Hitch, MD  REFERRING DIAG: [phone] (ICD-10-CM) - Status post left knee replacement   THERAPY DIAG:  Acute pain of left knee  Other abnormalities of gait and mobility  Muscle weakness (generalized)  Localized edema  Rationale for Evaluation and Treatment: Rehabilitation  ONSET DATE: 01/19/2023  SUBJECTIVE:   SUBJECTIVE STATEMENT: Patient comes in with high pain levels. Reports pain a 10/10. Took her pain medication at 8 am but is only feeling a little relief. Reports the pain has been really bad for two days. Wants to keep today light.    EVAL: Patient comes in with daughter 6 days post-op left TKA. Patient reports pain is most limiting factor to knee motion right now. She reports that she is using the ice 2x3 times a day but not currently elevating the knee. She has no issues sleeping when taking the pain medication which she is taking on a consistent basis. Her last intake of the oxycodone was at 5 AM and she took 800 mlg of ibuprofen at 8 AM. Reports that her nerve block has worn off. She had a TKA on the R knee last year and she reports the L knee feels better initially after surgery. Her walking is going well and she is able to do stairs with both legs on each stair. She reports she was sent home with a brace but has not been wearing it. Overall, she feels she is in a good place for being a week out of surgery.  PERTINENT HISTORY: L TKA 01/19/2023 R TKA 04/05/2021  PAIN:  Are you having pain? Yes: NPRS scale: 7/10 current, 10/10 worst.  Pain location: left knee Pain description: sharp, throbbing, achy Aggravating factors: everything  Relieving factors: ice and pain medication  PRECAUTIONS: Knee and Fall  RED FLAGS: None   WEIGHT BEARING RESTRICTIONS:  WBAT  FALLS:  Has patient fallen in last 6 months? No  LIVING ENVIRONMENT: Lives with: lives with their family Lives in: House/apartment Stairs: Yes: External: 14  steps; can reach both Has following equipment at home: Single point cane  OCCUPATION: N/A  PLOF: Independent  PATIENT GOALS: Return to ADLs without pain, regain ROM.   NEXT MD VISIT: January 30th   OBJECTIVE:  Note: Objective measures were completed at Evaluation unless otherwise noted.  PATIENT SURVEYS:  FOTO: 40% functional status, predicted 56%  COGNITION: Overall cognitive status: Within functional limits for tasks assessed   EDEMA:  Notable edema in left knee   MUSCLE LENGTH: Assess hamstring tightness next session   POSTURE: No Significant postural limitations  PALPATION: Edema  in left knee   LOWER EXTREMITY ROM:  Active ROM Right eval Left eval Left 1/28 Left 1/30 Left 2/3 Left 2/13  Hip flexion        Hip extension        Hip abduction        Hip adduction        Hip internal rotation        Hip external rotation        Knee flexion 110 85 100 102 115 106  Knee extension -2 1  3  0 1  Ankle dorsiflexion        Ankle plantarflexion        Ankle inversion        Ankle eversion         (Blank rows = not tested)  LOWER EXTREMITY MMT:  MMT Right eval Left eval  Hip flexion    Hip extension    Hip abduction    Hip adduction    Hip internal rotation    Hip external rotation    Knee flexion    Knee extension    Ankle dorsiflexion    Ankle plantarflexion    Ankle inversion    Ankle eversion     (Blank rows = not tested)  FUNCTIONAL TESTS:  30 sec sit-to-stand:  02/12/2023: 13 reps at elevated surface.   GAIT: Distance walked: 40 Assistive device utilized: Single point cane Level of assistance: Complete Independence Comments: Antalgic gait                                                                                                        TREATMENT DATE:  02/21/2023 Seated bike x4 min full ROM  Seated heel slides for flexion x15  LAQ 2x10  Quad Sets 1x15x5" Supine clam shells 2x15 GTB Sit to stand 2x15  Standing TKE against ball 2x15 Standing balance with tandem stance on trampoline 2x10  Slant board 2x30" Manual Therapy:  STM to knee cap for swelling Patellar mobs  02/15/2023  Seated bike x4 min full ROM  Slant board 2x30"  Supine quad sets towel at ankle 1x15x5" Supine heel slides 1x15x5" SLR 2X10 #2.5 LAQ 2x10 #2.5  Sidelying clam GTB 2x10 BL Standing mini squats at bar x15 Standing SLS balance on left 5x10 sec with one finger assist  Manual Therapy:  Patellar mobs medial and lateral, superior and inferior  STM to right calf   02/12/2023 Seated bike x4 min full ROM  SAQ off  bolster 2x10x3"  SLR 2x10 #2  Hip flexor  rainbows over dumbbell 2x10 Standing mini squats at bar 2x10 Weight shift x2 min x2 (fwd/bwd)  Sidelying hip abduction 2x10 BL Manual Therapy:  Patellar mobs medial and lateral, superior and inferior   02/08/2023 Seated bike x4 min full ROM  Standing mini squats at bar 2x10  Quad set with towel under ankle 1x15  SLR 2x10 #2  Hip flexor rainbows over dumbbell x10  Supine heel slides 2x10  Seated heel raises #15 x15  Standing TKE against ball 1x15x5" Gait training x5 min working on heel to toe gait and knee extension  Slant board x1 min   PATIENT EDUCATION:  Education details: HEP.  Person educated: Patient Education method: Explanation, Demonstration, Tactile cues, Verbal cues Education comprehension: verbalized understanding, returned demonstration, verbal cues required, tactile cues required, and needs further education  HOME EXERCISE PROGRAM: Access Code: [email or web address]   ASSESSMENT: CLINICAL IMPRESSION: Patient responded well to therapy today with no adverse effects. She came in with high pain levels even with medication. Manual therapy done to help relieve swelling. Limited in exercises today due to pain levels. Continued working on quad activation based on pain tolerance. Patient denied ice but was recommended to ice and elevate at home after session. Overall, patient would benefit from skilled PT to decrease pain levels, increase ROM, and increase strength to return to functional levels prior to surgery.   EVAL: Patient is a 63 y.o. female who was seen today for physical therapy evaluation and treatment for s/p left TKA on 01/19/2023. Patient responded well to therapy with no adverse effects. Patient has limited ROM in comparison to the right knee due to edema post op. Patient able to activate quad and complete a SLR, continue to progress quad strength in next sessions. Patient walks with a step through pattern at initial session indicating some initial level of quad strength. Overall,  patients pain levels are tolerable during treatment session. FOTO score of 40% indicating a decrease in functional ability following surgery. Patient would benefit from skilled PT to decrease pain levels and increase ROM to return to her functional levels prior to surgery.   OBJECTIVE IMPAIRMENTS: Abnormal gait, decreased ROM, decreased strength, increased edema, and pain.   ACTIVITY LIMITATIONS: sitting, standing, squatting, stairs, and locomotion level  PARTICIPATION LIMITATIONS: community activity  PERSONAL FACTORS: Past/current experiences and Time since onset of injury/illness/exacerbation are also affecting patient's functional outcome.   REHAB POTENTIAL: Good  CLINICAL DECISION MAKING: Stable/uncomplicated  EVALUATION COMPLEXITY: Low   GOALS: Goals reviewed with patient? Yes  SHORT TERM GOALS: Target date: 02/22/2023  Patient will be I with initial HEP in order to progress with therapy. Baseline: Goal status: INITIAL  2.  Patient will score </= 8/10 at worse on the NPS by 3rd visit in order to show an improvement in pain allowing for an increase in functional ability.  Baseline: 10/10 02/15/2023: 8/10  Goal status: MET  LONG TERM GOALS: Target date: 03/22/2023  Patient will be independent with final HEP in order to continue treatment at home for pain tolerance.  Baseline:  Goal status: INITIAL  2.  Patient will score at least 56% on FOTO to signify clinically meaningful improvement in functional abilities.  Baseline: 40% Goal status: INITIAL  3.  Patient will score </= 5/10 at worse on the NPSin order to show an improvement in pain allowing for an increase in functional ability.   Baseline: 10/10 Goal status: INITIAL  4.  Patient will increase  knee flexion ROM by  at least 25 degrees to increase functional ability.  Baseline: 85 deg 02/15/2023 Goal status: INITIAL  5.  Patient will increase knee strength to a 4/5 to show an increase in quad strength to improve  gait and functional ability. Baseline: 3/5 Goal status: INITIAL  PLAN: PT FREQUENCY: 2x/week  PT DURATION: 8 weeks  PLANNED INTERVENTIONS: 97164- PT Re-evaluation, 97110-Therapeutic exercises, 97530- Therapeutic activity, 97112- Neuromuscular re-education, 97535- Self Care, 16109- Manual therapy, [phone]- Gait training, [phone]- Vasopneumatic device, Patient/Family education, Stair training, Dry Needling, Joint mobilization, Joint manipulation, Spinal manipulation, Spinal mobilization, Cryotherapy, and Moist heat  PLAN FOR NEXT SESSION: review/revise HEP, continuing progressing strengthening, manual therapy, eccentric quad strength. Pain tolerance.    Erin Hearing, Student-PT 02/21/2023, 11:10 AM

## 2023-02-21 NOTE — Telephone Encounter (Signed)
Pt's wife Harriett Sine called for refill of pt's pain medication. Please send to pharmacy on file. Pt phone number is [phone].

## 2023-02-23 ENCOUNTER — Other Ambulatory Visit: Payer: Self-pay

## 2023-02-23 ENCOUNTER — Encounter: Payer: Self-pay | Admitting: Physical Therapy

## 2023-02-23 ENCOUNTER — Ambulatory Visit: Payer: Medicaid Other | Admitting: Physical Therapy

## 2023-02-23 DIAGNOSIS — R2689 Other abnormalities of gait and mobility: Secondary | ICD-10-CM

## 2023-02-23 DIAGNOSIS — R600 Localized edema: Secondary | ICD-10-CM

## 2023-02-23 DIAGNOSIS — M6281 Muscle weakness (generalized): Secondary | ICD-10-CM

## 2023-02-23 DIAGNOSIS — M25562 Pain in left knee: Secondary | ICD-10-CM

## 2023-02-23 NOTE — Therapy (Signed)
OUTPATIENT PHYSICAL THERAPY TREATMENT  Patient Name: Tracy Mercado MRN: [id number] DOB:01-Apr-1960, 63 y.o., female Today's Date: 02/23/2023  END OF SESSION:  PT End of Session - 02/23/23 0952     Visit Number 9    Number of Visits 17    Date for PT Re-Evaluation 03/22/23    Authorization Type [employer]    PT Start Time 0932    PT Stop Time 1015    PT Time Calculation (min) 43 min    Activity Tolerance Patient tolerated treatment well    Behavior During Therapy WFL for tasks assessed/performed             Past Medical History:  Diagnosis Date   Arthritis    CAD (coronary artery disease)    Hypertension    Past Surgical History:  Procedure Laterality Date   HAND SURGERY Left    KNEE ARTHROSCOPY     TOTAL KNEE ARTHROPLASTY Right 04/05/2021   Procedure: Right TOTAL KNEE ARTHROPLASTY;  Surgeon: Kathryne Hitch, MD;  Location: [facility];  Service: Orthopedics;  Laterality: Right;   TOTAL KNEE ARTHROPLASTY Left 01/19/2023   Procedure: LEFT TOTAL KNEE ARTHROPLASTY;  Surgeon: Kathryne Hitch, MD;  Location: [facility];  Service: Orthopedics;  Laterality: Left;   Patient Active Problem List   Diagnosis Date Noted   Status post total left knee replacement 01/19/2023   Unilateral primary osteoarthritis, left knee 12/04/2022   Status post right knee replacement 04/05/2021   PCP: N/A  REFERRING PROVIDER: Kathryne Hitch, MD  REFERRING DIAG: [phone] (ICD-10-CM) - Status post left knee replacement   THERAPY DIAG:  Acute pain of left knee  Other abnormalities of gait and mobility  Muscle weakness (generalized)  Localized edema  Rationale for Evaluation and Treatment: Rehabilitation  ONSET DATE: 01/19/2023  SUBJECTIVE:   SUBJECTIVE STATEMENT: Patient reports today is a better day. The soreness around the calf is better, still gets a "wonky" feeling around the knee cap.   EVAL: Patient comes in with daughter 6 days post-op left TKA. Patient reports  pain is most limiting factor to knee motion right now. She reports that she is using the ice 2x3 times a day but not currently elevating the knee. She has no issues sleeping when taking the pain medication which she is taking on a consistent basis. Her last intake of the oxycodone was at 5 AM and she took 800 mlg of ibuprofen at 8 AM. Reports that her nerve block has worn off. She had a TKA on the R knee last year and she reports the L knee feels better initially after surgery. Her walking is going well and she is able to do stairs with both legs on each stair. She reports she was sent home with a brace but has not been wearing it. Overall, she feels she is in a good place for being a week out of surgery.  PERTINENT HISTORY: L TKA 01/19/2023 R TKA 04/05/2021  PAIN:  Are you having pain? Yes: NPRS scale: 5/10 current, 10/10 worst.  Pain location: left knee Pain description: sharp, throbbing, achy Aggravating factors: everything  Relieving factors: ice and pain medication  PRECAUTIONS: Knee and Fall  RED FLAGS: None   WEIGHT BEARING RESTRICTIONS:  WBAT  FALLS:  Has patient fallen in last 6 months? No  LIVING ENVIRONMENT: Lives with: lives with their family Lives in: House/apartment Stairs: Yes: External: 14 steps; can reach both Has following equipment at home: Single point cane  OCCUPATION: N/A  PLOF: Independent  PATIENT GOALS: Return to ADLs without pain, regain ROM.    OBJECTIVE:  Note: Objective measures were completed at Evaluation unless otherwise noted.  PATIENT SURVEYS:  FOTO: 40% functional status, predicted 56%  COGNITION: Overall cognitive status: Within functional limits for tasks assessed   EDEMA:  Notable edema in left knee   MUSCLE LENGTH: Assess hamstring tightness next session   POSTURE: No Significant postural limitations  PALPATION: Edema in left knee   LOWER EXTREMITY ROM:  Active ROM Right eval Left eval Left 1/28 Left 1/30 Left 2/3 Left  2/13  Hip flexion        Hip extension        Hip abduction        Hip adduction        Hip internal rotation        Hip external rotation        Knee flexion 110 85 100 102 115 106  Knee extension -2 1  3  0 1  Ankle dorsiflexion        Ankle plantarflexion        Ankle inversion        Ankle eversion         (Blank rows = not tested)  LOWER EXTREMITY MMT:  MMT Right eval Left eval  Hip flexion    Hip extension    Hip abduction    Hip adduction    Hip internal rotation    Hip external rotation    Knee flexion    Knee extension    Ankle dorsiflexion    Ankle plantarflexion    Ankle inversion    Ankle eversion     (Blank rows = not tested)  FUNCTIONAL TESTS:  30 sec sit-to-stand:  02/12/2023: 13 reps at elevated surface.   GAIT: Distance walked: 40 Assistive device utilized: Single point cane Level of assistance: Complete Independence Comments: Antalgic gait                                                                                                         TREATMENT  [facility] Adult PT Treatment:                                                DATE: 02/23/2023 NuStep L5 x 5 min with LE to improve endurance and workload capacity Seated knee joint mobs with passive knee flexion Supine patellar mobs all directions Quad set with small towel under knee 10 x 5 sec SLR 2 x 10 Seated hamstring stretch 3 x 20 sec Slant board calf stretch 3 x 20 sec Supported squat at [employer] bar 2 x 10 LAQ with 5# 2 x 10 Standing TKE with blue 2 x 10 Standing heel raises 2 x 15   02/21/2023 Seated bike x4 min full ROM  Seated heel slides for flexion x15  LAQ 2x10  Quad Sets 1x15x5" Supine clam shells 2x15 GTB  Sit to stand 2x15  Standing TKE against ball 2x15 Standing balance with tandem stance on trampoline 2x10  Slant board 2x30" Manual Therapy:  STM to knee cap for swelling Patellar mobs  PATIENT EDUCATION:  Education details: HEP.  Person educated: Patient Education method:  Explanation, Demonstration, Tactile cues, Verbal cues Education comprehension: verbalized understanding, returned demonstration, verbal cues required, tactile cues required, and needs further education  HOME EXERCISE PROGRAM: Access Code: [email or web address]   ASSESSMENT: CLINICAL IMPRESSION: Patient tolerated therapy well with no adverse effects. Therapy focused on progressing knee mobility, strength, and control with good tolerance. She reports improvement in left knee pain this visit and did not report any increase in pain with therapy. She does exhibit a continued slight limitation with achieving full knee extension this visit so continued with stretching for the posterior leg and progressing quad activation and strengthening. No changes made to her HEP this visit. Patient would benefit from continued skilled PT to progress knee mobility and strength in order to reduce pain and maximize functional ability.    EVAL: Patient is a 63 y.o. female who was seen today for physical therapy evaluation and treatment for s/p left TKA on 01/19/2023. Patient responded well to therapy with no adverse effects. Patient has limited ROM in comparison to the right knee due to edema post op. Patient able to activate quad and complete a SLR, continue to progress quad strength in next sessions. Patient walks with a step through pattern at initial session indicating some initial level of quad strength. Overall, patients pain levels are tolerable during treatment session. FOTO score of 40% indicating a decrease in functional ability following surgery. Patient would benefit from skilled PT to decrease pain levels and increase ROM to return to her functional levels prior to surgery.   OBJECTIVE IMPAIRMENTS: Abnormal gait, decreased ROM, decreased strength, increased edema, and pain.   ACTIVITY LIMITATIONS: sitting, standing, squatting, stairs, and locomotion level  PARTICIPATION LIMITATIONS: community activity  PERSONAL FACTORS:  Past/current experiences and Time since onset of injury/illness/exacerbation are also affecting patient's functional outcome.    GOALS: Goals reviewed with patient? Yes  SHORT TERM GOALS: Target date: 02/22/2023  Patient will be I with initial HEP in order to progress with therapy. Baseline: Goal status: INITIAL  2.  Patient will score </= 8/10 at worse on the NPS by 3rd visit in order to show an improvement in pain allowing for an increase in functional ability.  Baseline: 10/10 02/15/2023: 8/10  Goal status: MET  LONG TERM GOALS: Target date: 03/22/2023  Patient will be independent with final HEP in order to continue treatment at home for pain tolerance.  Baseline:  Goal status: INITIAL  2.  Patient will score at least 56% on FOTO to signify clinically meaningful improvement in functional abilities.  Baseline: 40% Goal status: INITIAL  3.  Patient will score </= 5/10 at worse on the NPSin order to show an improvement in pain allowing for an increase in functional ability.   Baseline: 10/10 Goal status: INITIAL  4.  Patient will increase knee flexion ROM by  at least 25 degrees to increase functional ability.  Baseline: 85 deg 02/15/2023 Goal status: INITIAL  5.  Patient will increase knee strength to a 4/5 to show an increase in quad strength to improve gait and functional ability. Baseline: 3/5 Goal status: INITIAL   PLAN: PT FREQUENCY: 2x/week  PT DURATION: 8 weeks  PLANNED INTERVENTIONS: 97164- PT Re-evaluation, 97110-Therapeutic exercises, 97530- Therapeutic activity, [id number]- Neuromuscular re-education, 97535- Self  Care, 69629- Manual therapy, [phone]- Gait training, [phone]- Vasopneumatic device, Patient/Family education, Stair training, Dry Needling, Joint mobilization, Joint manipulation, Spinal manipulation, Spinal mobilization, Cryotherapy, and Moist heat  PLAN FOR NEXT SESSION: review/revise HEP, continuing progressing strengthening, manual therapy, eccentric quad  strength. Pain tolerance.    Rosana Hoes, PT, DPT, LAT, ATC 02/23/23  10:15 AM Phone: [phone] Fax: [phone]

## 2023-02-26 ENCOUNTER — Telehealth: Payer: Self-pay | Admitting: Orthopaedic Surgery

## 2023-02-26 ENCOUNTER — Other Ambulatory Visit: Payer: Self-pay | Admitting: Orthopaedic Surgery

## 2023-02-26 ENCOUNTER — Ambulatory Visit: Payer: Medicaid Other | Admitting: Physical Therapy

## 2023-02-26 ENCOUNTER — Encounter: Payer: Self-pay | Admitting: Physical Therapy

## 2023-02-26 ENCOUNTER — Other Ambulatory Visit: Payer: Self-pay

## 2023-02-26 DIAGNOSIS — R600 Localized edema: Secondary | ICD-10-CM

## 2023-02-26 DIAGNOSIS — M6281 Muscle weakness (generalized): Secondary | ICD-10-CM

## 2023-02-26 DIAGNOSIS — M25562 Pain in left knee: Secondary | ICD-10-CM | POA: Diagnosis not present

## 2023-02-26 DIAGNOSIS — R2689 Other abnormalities of gait and mobility: Secondary | ICD-10-CM

## 2023-02-26 MED ORDER — OXYCODONE HCL 5 MG PO TABS
5.0000 mg | ORAL_TABLET | Freq: Three times a day (TID) | ORAL | 0 refills | Status: DC | PRN
Start: 2023-02-26 — End: 2023-03-02

## 2023-02-26 MED ORDER — TIZANIDINE HCL 4 MG PO TABS: ORAL_TABLET | ORAL | 1 refills | Status: DC

## 2023-02-26 NOTE — Therapy (Addendum)
 OUTPATIENT PHYSICAL THERAPY TREATMENT  Patient Name: Tracy Mercado MRN: [id number] DOB:[birth year], 63 y.o., female Today's Date: 02/26/2023  END OF SESSION:  PT End of Session - 02/26/23 0912     Visit Number 10    Number of Visits 17    Date for PT Re-Evaluation 03/22/23    Authorization Type [facility] [employer]    PT Start Time 0850    PT Stop Time 0930    PT Time Calculation (min) 40 min    Activity Tolerance Patient tolerated treatment well    Behavior During Therapy WFL for tasks assessed/performed             Past Medical History:  Diagnosis Date   Arthritis    CAD (coronary artery disease)    Hypertension    Past Surgical History:  Procedure Laterality Date   HAND SURGERY Left    KNEE ARTHROSCOPY     TOTAL KNEE ARTHROPLASTY Right 04/05/2021   Procedure: Right TOTAL KNEE ARTHROPLASTY;  Surgeon: Kathryne Hitch, MD;  Location: [facility];  Service: Orthopedics;  Laterality: Right;   TOTAL KNEE ARTHROPLASTY Left 01/19/2023   Procedure: LEFT TOTAL KNEE ARTHROPLASTY;  Surgeon: Kathryne Hitch, MD;  Location: [facility];  Service: Orthopedics;  Laterality: Left;   Patient Active Problem List   Diagnosis Date Noted   Status post total left knee replacement 01/19/2023   Unilateral primary osteoarthritis, left knee 12/04/2022   Status post right knee replacement 04/05/2021   PCP: N/A  REFERRING PROVIDER: Kathryne Hitch, MD  REFERRING DIAG: [phone] (ICD-10-CM) - Status post left knee replacement   THERAPY DIAG:  Acute pain of left knee  Other abnormalities of gait and mobility  Muscle weakness (generalized)  Localized edema  Rationale for Evaluation and Treatment: Rehabilitation  ONSET DATE: 01/19/2023   SUBJECTIVE:  SUBJECTIVE STATEMENT: Patient reports that she ran out of pain medication and is in a lot of pain. She has not taken pain medication since yesterday afternoon and rates her pain a 9/10. She reports she was having a good weekend until  she ran out of pain medication. Has not slept well due to pain. Is compliant with HEP at home and icing and elevating on the regular.   EVAL: Patient comes in with daughter 6 days post-op left TKA. Patient reports pain is most limiting factor to knee motion right now. She reports that she is using the ice 2x3 times a day but not currently elevating the knee. She has no issues sleeping when taking the pain medication which she is taking on a consistent basis. Her last intake of the oxycodone was at 5 AM and she took 800 mlg of ibuprofen at 8 AM. Reports that her nerve block has worn off. She had a TKA on the R knee last year and she reports the L knee feels better initially after surgery. Her walking is going well and she is able to do stairs with both legs on each stair. She reports she was sent home with a brace but has not been wearing it. Overall, she feels she is in a good place for being a week out of surgery.  PERTINENT HISTORY: L TKA 01/19/2023 R TKA 04/05/2021  PAIN:  Are you having pain? Yes:  NPRS scale: 5/10 current, 10/10 worst.  Pain location: left knee Pain description: sharp, throbbing, achy Aggravating factors: everything  Relieving factors: ice and pain medication  PRECAUTIONS: Knee and Fall  RED FLAGS: None   WEIGHT BEARING RESTRICTIONS:  WBAT  FALLS:  Has patient fallen in last 6 months? No  LIVING ENVIRONMENT: Lives with: lives with their family Lives in: House/apartment Stairs: Yes: External: 14 steps; can reach both Has following equipment at home: Single point cane  OCCUPATION: N/A  PLOF: Independent  PATIENT GOALS: Return to ADLs without pain, regain ROM.    OBJECTIVE:  Note: Objective measures were completed at Evaluation unless otherwise noted. PATIENT SURVEYS:  FOTO: 40% functional status, predicted 56%  COGNITION: Overall cognitive status: Within functional limits for tasks assessed   EDEMA:  Notable edema in left knee   MUSCLE  LENGTH: Assess hamstring tightness next session   POSTURE: No Significant postural limitations  PALPATION: Edema in left knee   LOWER EXTREMITY ROM:  Active ROM Right eval Left eval Left 1/28 Left 1/30 Left 2/3 Left 2/13 Left 2/24  Hip flexion         Hip extension         Hip abduction         Hip adduction         Hip internal rotation         Hip external rotation         Knee flexion 110 85 100 102 115 106 110  Knee extension -2 1  3  0 1   Ankle dorsiflexion         Ankle plantarflexion         Ankle inversion         Ankle eversion          (Blank rows = not tested)  LOWER EXTREMITY MMT:  MMT Right eval Left eval  Hip flexion    Hip extension    Hip abduction    Hip adduction    Hip internal rotation    Hip external rotation    Knee flexion    Knee extension    Ankle dorsiflexion    Ankle plantarflexion    Ankle inversion    Ankle eversion     (Blank rows = not tested)  FUNCTIONAL TESTS:  30 sec sit-to-stand:  02/12/2023: 13 reps at elevated surface.   GAIT: Distance walked: 40 Assistive device utilized: Single point cane Level of assistance: Complete Independence Comments: Antalgic gait                                                                                                         TREATMENT  [facility] Adult PT Treatment:     DATE: 02/26/2023  NuStep L5 x 4 min with LE to improve endurance and workload capacity  Supine patellar mobs in all directions  Standing minisquats at bar 2x10 Quad set with towel under ankle 2x10x5"  SLR 2x15  Supine knee flexion 1x20  Sidelying clamshells RTB 2x20  Standing heel raises 2x10  Standing SL balance 3x20"  Standing TKE against ball 1x15  Slant board calf stretch 3x20"    PATIENT EDUCATION:  Education details: HEP  Person educated: Patient Education method: Explanation, Demonstration, Tactile cues, Verbal cues Education comprehension: verbalized understanding, returned demonstration, verbal  cues  required, tactile cues required, and needs further education  HOME EXERCISE PROGRAM: Access Code: [email or web address]    ASSESSMENT: CLINICAL IMPRESSION: Patient tolerated therapy well with no adverse effects. She continues to be pain dominant post op but is progressing well. She rates her pan a 9/10 today due to running out of her pain medication. She has 110 degrees of knee flexion today. Therapy today continued to focus on progressing knee mobility, strength, and control with good tolerance. She continues to exhibit lower endurance levels and exhibits a limp when fatigued. Quad activation is still a main focus of treatment as well as functional knee flexion. Her mini squats continue to look good with no form deficits. Overall, decreasing pain tolerance and increasing ROM and strength deficits can continue to improve through PT to increase functional abilities. No changes made to HEP today, will review next session.    EVAL: Patient is a 63 y.o. female who was seen today for physical therapy evaluation and treatment for s/p left TKA on 01/19/2023. Patient responded well to therapy with no adverse effects. Patient has limited ROM in comparison to the right knee due to edema post op. Patient able to activate quad and complete a SLR, continue to progress quad strength in next sessions. Patient walks with a step through pattern at initial session indicating some initial level of quad strength. Overall, patients pain levels are tolerable during treatment session. FOTO score of 40% indicating a decrease in functional ability following surgery. Patient would benefit from skilled PT to decrease pain levels and increase ROM to return to her functional levels prior to surgery.   OBJECTIVE IMPAIRMENTS: Abnormal gait, decreased ROM, decreased strength, increased edema, and pain.   ACTIVITY LIMITATIONS: sitting, standing, squatting, stairs, and locomotion level  PARTICIPATION LIMITATIONS: community activity  PERSONAL  FACTORS: Past/current experiences and Time since onset of injury/illness/exacerbation are also affecting patient's functional outcome.    GOALS: Goals reviewed with patient? Yes  SHORT TERM GOALS: Target date: 02/22/2023  Patient will be I with initial HEP in order to progress with therapy. Baseline: 02/26/2023: independent Goal status: MET  2.  Patient will score </= 8/10 at worse on the NPS by 3rd visit in order to show an improvement in pain allowing for an increase in functional ability.  Baseline: 10/10 02/15/2023: 8/10  Goal status: MET  LONG TERM GOALS: Target date: 03/22/2023  Patient will be independent with final HEP in order to continue treatment at home for pain tolerance.  Baseline:  Goal status: INITIAL  2.  Patient will score at least 56% on FOTO to signify clinically meaningful improvement in functional abilities.  Baseline: 40% Goal status: INITIAL  3.  Patient will score </= 5/10 at worse on the NPSin order to show an improvement in pain allowing for an increase in functional ability.   Baseline: 10/10 Goal status: INITIAL  4.  Patient will increase knee flexion ROM by  at least 25 degrees to increase functional ability.  Baseline: 85 deg 02/15/2023 Goal status: INITIAL  5.  Patient will increase knee strength to a 4/5 to show an increase in quad strength to improve gait and functional ability. Baseline: 3/5 Goal status: INITIAL   PLAN: PT FREQUENCY: 2x/week  PT DURATION: 8 weeks  PLANNED INTERVENTIONS: 97164- PT Re-evaluation, 97110-Therapeutic exercises, 97530- Therapeutic activity, 97112- Neuromuscular re-education, 97535- Self Care, 54098- Manual therapy, [phone]- Gait training, [phone]- Vasopneumatic device, Patient/Family education, Stair training, Dry Needling, Joint mobilization, Joint manipulation, Spinal manipulation, Spinal mobilization, Cryotherapy, and  Moist heat  PLAN FOR NEXT SESSION: review/revise HEP, continuing progressing strengthening and  eccentric quad strength. Pain tolerance.    Erin Hearing, Student-PT 02/26/2023, 10:13 AM

## 2023-02-26 NOTE — Telephone Encounter (Signed)
 Patient's spouse called. Patient is out of her oxycodone and the Zanaflex. Would like refills for both.

## 2023-02-28 ENCOUNTER — Ambulatory Visit (INDEPENDENT_AMBULATORY_CARE_PROVIDER_SITE_OTHER): Payer: Medicaid Other | Admitting: Orthopaedic Surgery

## 2023-02-28 ENCOUNTER — Encounter: Payer: Self-pay | Admitting: Orthopaedic Surgery

## 2023-02-28 DIAGNOSIS — Z96652 Presence of left artificial knee joint: Secondary | ICD-10-CM

## 2023-02-28 NOTE — Progress Notes (Signed)
 HPI: Ms. Tracy Mercado returns today status post left total knee arthroplasty 01/19/2023.  She states overall she feels like she is not where she should be.  She states her pain is worse at night.  Ranks pain to be 10 out of 10 pain.  She is taking Neurontin 300 mg at night, also taking ibuprofen oxycodone and Zanaflex.  She is no longer on aspirin as she is not on aspirin prior to surgery.  She is ambulating with a cane today.  Physical exam: Left knee full extension flexion 110.  Surgical incisions healing well no signs of infection.  No instability valgus varus stressing.  Left calf supple nontender dorsiflexion plantarflexion left ankle intact.  Impression: Status post left total knee arthroplasty with  Plan: Recommend she get on a sleep schedule.  We will change her to Tylenol 3 at her next refill.  Did encourage her to cut down on narcotics sparingly.  We will have her try melatonin 5 mg half hour prior to going to bed.  Continue to work on quad strengthening range of motion.  Follow-up in 1 month sooner if there is any questions or concerns.

## 2023-03-02 ENCOUNTER — Encounter: Payer: Self-pay | Admitting: Physical Therapy

## 2023-03-02 ENCOUNTER — Ambulatory Visit: Payer: Medicaid Other | Admitting: Physical Therapy

## 2023-03-02 ENCOUNTER — Other Ambulatory Visit: Payer: Self-pay

## 2023-03-02 ENCOUNTER — Other Ambulatory Visit: Payer: Self-pay | Admitting: Orthopaedic Surgery

## 2023-03-02 ENCOUNTER — Telehealth: Payer: Self-pay | Admitting: Orthopaedic Surgery

## 2023-03-02 DIAGNOSIS — M6281 Muscle weakness (generalized): Secondary | ICD-10-CM

## 2023-03-02 DIAGNOSIS — M25562 Pain in left knee: Secondary | ICD-10-CM

## 2023-03-02 DIAGNOSIS — R600 Localized edema: Secondary | ICD-10-CM

## 2023-03-02 DIAGNOSIS — R2689 Other abnormalities of gait and mobility: Secondary | ICD-10-CM

## 2023-03-02 MED ORDER — OXYCODONE HCL 5 MG PO TABS: 5.0000 mg | ORAL_TABLET | Freq: Three times a day (TID) | ORAL | 0 refills | Status: DC | PRN

## 2023-03-02 NOTE — Therapy (Addendum)
 OUTPATIENT PHYSICAL THERAPY TREATMENT  Patient Name: Tracy Mercado MRN: [id number] DOB:[birth year], 63 y.o., female Today's Date: 03/02/2023   END OF SESSION:  PT End of Session - 03/02/23 0801     Visit Number 11    Number of Visits 17    Date for PT Re-Evaluation 03/22/23    Authorization Type [employer]    PT Start Time 0801    PT Stop Time 0847    PT Time Calculation (min) 46 min    Activity Tolerance Patient tolerated treatment well    Behavior During Therapy WFL for tasks assessed/performed            Past Medical History:  Diagnosis Date   Arthritis    CAD (coronary artery disease)    Hypertension    Past Surgical History:  Procedure Laterality Date   HAND SURGERY Left    KNEE ARTHROSCOPY     TOTAL KNEE ARTHROPLASTY Right 04/05/2021   Procedure: Right TOTAL KNEE ARTHROPLASTY;  Surgeon: Kathryne Hitch, MD;  Location: [facility];  Service: Orthopedics;  Laterality: Right;   TOTAL KNEE ARTHROPLASTY Left 01/19/2023   Procedure: LEFT TOTAL KNEE ARTHROPLASTY;  Surgeon: Kathryne Hitch, MD;  Location: [facility];  Service: Orthopedics;  Laterality: Left;   Patient Active Problem List   Diagnosis Date Noted   Status post total left knee replacement 01/19/2023   Unilateral primary osteoarthritis, left knee 12/04/2022   Status post right knee replacement 04/05/2021   PCP: N/A  REFERRING PROVIDER: Kathryne Hitch, MD  REFERRING DIAG: [phone] (ICD-10-CM) - Status post left knee replacement   THERAPY DIAG:  Acute pain of left knee  Other abnormalities of gait and mobility  Muscle weakness (generalized)  Localized edema  Rationale for Evaluation and Treatment: Rehabilitation  ONSET DATE: 01/19/2023   SUBJECTIVE:  SUBJECTIVE STATEMENT: Patient reports that her pain is feeling better, she rates her pain a 4/10 today. She saw the doctor and he is happy with her progress she has made thus far. Going to work on weaning her off the oxycodone and onto  tylenol 3. Is working on trying to get in a more consistent sleep schedule. Took her pain medication at 7 am.   EVAL: Patient comes in with daughter 6 days post-op left TKA. Patient reports pain is most limiting factor to knee motion right now. She reports that she is using the ice 2x3 times a day but not currently elevating the knee. She has no issues sleeping when taking the pain medication which she is taking on a consistent basis. Her last intake of the oxycodone was at 5 AM and she took 800 mlg of ibuprofen at 8 AM. Reports that her nerve block has worn off. She had a TKA on the R knee last year and she reports the L knee feels better initially after surgery. Her walking is going well and she is able to do stairs with both legs on each stair. She reports she was sent home with a brace but has not been wearing it. Overall, she feels she is in a good place for being a week out of surgery.  PERTINENT HISTORY: L TKA 01/19/2023 R TKA 04/05/2021  PAIN:  Are you having pain? Yes:  NPRS scale: 5/10 current, 10/10 worst.  Pain location: left knee Pain description: sharp, throbbing, achy Aggravating factors: everything  Relieving factors: ice and pain medication  PRECAUTIONS: Knee and Fall  RED FLAGS: None   WEIGHT BEARING RESTRICTIONS:  WBAT  FALLS:  Has patient fallen in last 6 months? No  LIVING ENVIRONMENT: Lives with: lives with their family Lives in: House/apartment Stairs: Yes: External: 14 steps; can reach both Has following equipment at home: Single point cane  OCCUPATION: N/A  PLOF: Independent  PATIENT GOALS: Return to ADLs without pain, regain ROM.    OBJECTIVE:  Note: Objective measures were completed at Evaluation unless otherwise noted. PATIENT SURVEYS:  FOTO: 40% functional status, predicted 56%  COGNITION: Overall cognitive status: Within functional limits for tasks assessed   EDEMA:  Notable edema in left knee   MUSCLE LENGTH: Assess hamstring tightness  next session   POSTURE: No Significant postural limitations  PALPATION: Edema in left knee   LOWER EXTREMITY ROM:  Active ROM Right eval Left eval Left 1/28 Left 1/30 Left 2/3 Left 2/13 Left 2/24 Left 2/28  Hip flexion          Hip extension          Hip abduction          Hip adduction          Hip internal rotation          Hip external rotation          Knee flexion 110 85 100 102 115 106 110 110  Knee extension -2 1  3  0 1    Ankle dorsiflexion          Ankle plantarflexion          Ankle inversion          Ankle eversion           (Blank rows = not tested)  LOWER EXTREMITY MMT:  MMT Right eval Left eval  Hip flexion    Hip extension    Hip abduction    Hip adduction    Hip internal rotation    Hip external rotation    Knee flexion    Knee extension    Ankle dorsiflexion    Ankle plantarflexion    Ankle inversion    Ankle eversion     (Blank rows = not tested)  FUNCTIONAL TESTS:  30 sec sit-to-stand:  02/12/2023: 13 reps at elevated surface.   GAIT: Distance walked: 40 Assistive device utilized: Single point cane Level of assistance: Complete Independence Comments: Antalgic gait                                                                                                         TREATMENT  [facility] Adult PT Treatment:     DATE: 02/26/2023  NuStep L5 x 5 min with LE to improve endurance and workload capacity  Standing minisquats at bar 2x10 Step up 4" 2x10 Heel taps 2" 2x8  SLR 2x10 2# Sidelying hip abduction #2 2x10  LAQ #2 2x10  Standing marches 2x10 Supine knee flexion 1x15  Standing TKE against green sport cord 2x10 Gait training for 5 min x 300 ft  PATIENT EDUCATION:  Education details: HEP  Person educated: Patient Education method: Programmer, multimedia, Demonstration, Actor cues, Verbal cues Education comprehension: verbalized  understanding, returned demonstration, verbal cues required, tactile cues required, and needs further  education  HOME EXERCISE PROGRAM: Access Code: [email or web address]    ASSESSMENT: CLINICAL IMPRESSION: Patient tolerated therapy well with no adverse effects. She comes in with reports of much lower pain levels, at 4/10 today. She has 110 degrees of knee flexion today and full extension. Therapy continued to focus on knee mobility, strength, and control with good tolerance. Began working on more weight bearing activities today with a step up and heel tap. She continues to exhibit lower endurance levels and fatigue during exercise. Knee flexion looks good with limited weight shifting to the right side during left knee flexion. Overall, a decrease in pain has allowed [place] to progress into more functional activity today. Patient rated pain a 2/10 leaving treatment today which is a huge improvement. No changes made to HEP today - review next session. Patient will continue to benefit from skilled PT in order to continue increasing ROM, LE strength, improve gait, and increase endurance to be able to return fully to functional activities including work.   EVAL: Patient is a 63 y.o. female who was seen today for physical therapy evaluation and treatment for s/p left TKA on 01/19/2023. Patient responded well to therapy with no adverse effects. Patient has limited ROM in comparison to the right knee due to edema post op. Patient able to activate quad and complete a SLR, continue to progress quad strength in next sessions. Patient walks with a step through pattern at initial session indicating some initial level of quad strength. Overall, patients pain levels are tolerable during treatment session. FOTO score of 40% indicating a decrease in functional ability following surgery. Patient would benefit from skilled PT to decrease pain levels and increase ROM to return to her functional levels prior to surgery.   OBJECTIVE IMPAIRMENTS: Abnormal gait, decreased ROM, decreased strength, increased edema, and pain.   ACTIVITY  LIMITATIONS: sitting, standing, squatting, stairs, and locomotion level  PARTICIPATION LIMITATIONS: community activity  PERSONAL FACTORS: Past/current experiences and Time since onset of injury/illness/exacerbation are also affecting patient's functional outcome.    GOALS: Goals reviewed with patient? Yes  SHORT TERM GOALS: Target date: 02/22/2023  Patient will be I with initial HEP in order to progress with therapy. Baseline: 02/26/2023: independent Goal status: MET  2.  Patient will score </= 8/10 at worse on the NPS by 3rd visit in order to show an improvement in pain allowing for an increase in functional ability.  Baseline: 10/10 02/15/2023: 8/10  Goal status: MET  LONG TERM GOALS: Target date: 03/22/2023  Patient will be independent with final HEP in order to continue treatment at home for pain tolerance.  Baseline:  Goal status: INITIAL  2.  Patient will score at least 56% on FOTO to signify clinically meaningful improvement in functional abilities.  Baseline: 40% Goal status: INITIAL  3.  Patient will score </= 5/10 at worse on the NPSin order to show an improvement in pain allowing for an increase in functional ability.   Baseline: 10/10 Goal status: INITIAL  4.  Patient will increase knee flexion ROM by  at least 25 degrees to increase functional ability.  Baseline: 85 deg 02/15/2023 Goal status: INITIAL  5.  Patient will increase knee strength to a 4/5 to show an increase in quad strength to improve gait and functional ability. Baseline: 3/5 Goal status: INITIAL   PLAN: PT FREQUENCY: 2x/week  PT DURATION: 8 weeks  PLANNED INTERVENTIONS: 97164- PT Re-evaluation, 97110-Therapeutic exercises, 97530-  Therapeutic activity, [id number]- Neuromuscular re-education, [phone]- Self Care, 69629- Manual therapy, [phone]- Gait training, [phone]- Vasopneumatic device, Patient/Family education, Stair training, Dry Needling, Joint mobilization, Joint manipulation, Spinal manipulation,  Spinal mobilization, Cryotherapy, and Moist heat  PLAN FOR NEXT SESSION: review/revise HEP, continuing progressing strengthening and eccentric quad strength. Pain tolerance.   Erin Hearing, Student-PT 03/02/2023, 9:11 AM

## 2023-03-02 NOTE — Telephone Encounter (Signed)
 Received call from [place] (spouse) of the patient advised patient need Rx refilled Oxycodone 5 mg. The number to contact Harriett Sine is [phone]

## 2023-03-06 ENCOUNTER — Telehealth: Payer: Self-pay | Admitting: Orthopaedic Surgery

## 2023-03-06 ENCOUNTER — Other Ambulatory Visit: Payer: Self-pay | Admitting: Orthopaedic Surgery

## 2023-03-06 MED ORDER — OXYCODONE HCL 5 MG PO TABS: 5.0000 mg | ORAL_TABLET | Freq: Three times a day (TID) | ORAL | 0 refills | Status: DC | PRN

## 2023-03-06 NOTE — Telephone Encounter (Signed)
 Patient called. Would like some pain medication called in.

## 2023-03-07 ENCOUNTER — Ambulatory Visit: Payer: Medicaid Other | Admitting: Physical Therapy

## 2023-03-09 ENCOUNTER — Encounter: Payer: Self-pay | Admitting: Physical Therapy

## 2023-03-09 ENCOUNTER — Ambulatory Visit: Payer: Medicaid Other | Attending: Orthopaedic Surgery | Admitting: Physical Therapy

## 2023-03-09 DIAGNOSIS — M6281 Muscle weakness (generalized): Secondary | ICD-10-CM | POA: Insufficient documentation

## 2023-03-09 DIAGNOSIS — R2689 Other abnormalities of gait and mobility: Secondary | ICD-10-CM | POA: Insufficient documentation

## 2023-03-09 DIAGNOSIS — M25562 Pain in left knee: Secondary | ICD-10-CM | POA: Insufficient documentation

## 2023-03-09 NOTE — Therapy (Signed)
 OUTPATIENT PHYSICAL THERAPY TREATMENT  Patient Name: Tracy Mercado MRN: [id number] DOB:02-May-1960, 63 y.o., female Today's Date: 03/09/2023   END OF SESSION:  PT End of Session - 03/09/23 1018     Visit Number 12    Number of Visits 17    Date for PT Re-Evaluation 03/22/23    Authorization Type [facility] [employer]    PT Start Time 1015    PT Stop Time 1055    PT Time Calculation (min) 40 min            Past Medical History:  Diagnosis Date   Arthritis    CAD (coronary artery disease)    Hypertension    Past Surgical History:  Procedure Laterality Date   HAND SURGERY Left    KNEE ARTHROSCOPY     TOTAL KNEE ARTHROPLASTY Right 04/05/2021   Procedure: Right TOTAL KNEE ARTHROPLASTY;  Surgeon: Kathryne Hitch, MD;  Location: [facility];  Service: Orthopedics;  Laterality: Right;   TOTAL KNEE ARTHROPLASTY Left 01/19/2023   Procedure: LEFT TOTAL KNEE ARTHROPLASTY;  Surgeon: Kathryne Hitch, MD;  Location: [facility];  Service: Orthopedics;  Laterality: Left;   Patient Active Problem List   Diagnosis Date Noted   Status post total left knee replacement 01/19/2023   Unilateral primary osteoarthritis, left knee 12/04/2022   Status post right knee replacement 04/05/2021   PCP: N/A  REFERRING PROVIDER: Kathryne Hitch, MD  REFERRING DIAG: [phone] (ICD-10-CM) - Status post left knee replacement   THERAPY DIAG:  Acute pain of left knee  Other abnormalities of gait and mobility  Rationale for Evaluation and Treatment: Rehabilitation  ONSET DATE: 01/19/2023   SUBJECTIVE:  SUBJECTIVE STATEMENT: Knee pain is 2/10. Took my medicine.    EVAL: Patient comes in with daughter 6 days post-op left TKA. Patient reports pain is most limiting factor to knee motion right now. She reports that she is using the ice 2x3 times a day but not currently elevating the knee. She has no issues sleeping when taking the pain medication which she is taking on a consistent basis. Her last  intake of the oxycodone was at 5 AM and she took 800 mlg of ibuprofen at 8 AM. Reports that her nerve block has worn off. She had a TKA on the R knee last year and she reports the L knee feels better initially after surgery. Her walking is going well and she is able to do stairs with both legs on each stair. She reports she was sent home with a brace but has not been wearing it. Overall, she feels she is in a good place for being a week out of surgery.  PERTINENT HISTORY: L TKA 01/19/2023 R TKA 04/05/2021  PAIN:  Are you having pain? Yes:  NPRS scale: 2/10 current, 10/10 worst.  Pain location: left knee Pain description: sharp, throbbing, achy Aggravating factors: everything  Relieving factors: ice and pain medication  PRECAUTIONS: Knee and Fall  RED FLAGS: None   WEIGHT BEARING RESTRICTIONS:  WBAT  FALLS:  Has patient fallen in last 6 months? No  LIVING ENVIRONMENT: Lives with: lives with their family Lives in: House/apartment Stairs: Yes: External: 14 steps; can reach both Has following equipment at home: Single point cane  OCCUPATION: N/A  PLOF: Independent  PATIENT GOALS: Return to ADLs without pain, regain ROM.    OBJECTIVE:  Note: Objective measures were completed at Evaluation unless otherwise noted. PATIENT SURVEYS:  FOTO: 40% functional status, predicted 56%  COGNITION: Overall cognitive  status: Within functional limits for tasks assessed   EDEMA:  Notable edema in left knee   MUSCLE LENGTH: Assess hamstring tightness next session   POSTURE: No Significant postural limitations  PALPATION: Edema in left knee   LOWER EXTREMITY ROM:  Active ROM Right eval Left eval Left 1/28 Left 1/30 Left 2/3 Left 2/13 Left 2/24 Left 2/28  Hip flexion          Hip extension          Hip abduction          Hip adduction          Hip internal rotation          Hip external rotation          Knee flexion 110 85 100 102 115 106 110 110  Knee extension -2 1  3  0 1     Ankle dorsiflexion          Ankle plantarflexion          Ankle inversion          Ankle eversion           (Blank rows = not tested)  LOWER EXTREMITY MMT:  MMT Right eval Left eval  Hip flexion    Hip extension    Hip abduction    Hip adduction    Hip internal rotation    Hip external rotation    Knee flexion    Knee extension    Ankle dorsiflexion    Ankle plantarflexion    Ankle inversion    Ankle eversion     (Blank rows = not tested)  FUNCTIONAL TESTS:  30 sec sit-to-stand:  02/12/2023: 13 reps at elevated surface.   GAIT: Distance walked: 40 Assistive device utilized: Single point cane Level of assistance: Complete Independence Comments: Antalgic gait                                                                                                         TREATMENT  [facility] Adult PT Treatment:                                                DATE: 03/09/23 Therapeutic Exercise: Rec Bike L2 x 5 minutes  4 inch step up 6 inch step up  SLS 5 sec best  Tandem stance 15 sec best  Squats with UE support  LAQ 3# 10 x 2  Seated h/s curl red band 10 x 2  Supine SLR 2# 10 x 2  Side hip abduction 2# 10 x 2  Prone quad stretch with strap x 3   [facility] Adult PT Treatment:     DATE: 02/26/2023  NuStep L5 x 5 min with LE to improve endurance and workload capacity  Standing minisquats at bar 2x10 Step up 4" 2x10 Heel taps 2" 2x8  SLR 2x10 2# Sidelying hip abduction #2 2x10  LAQ #2 2x10  Standing marches 2x10 Supine knee flexion 1x15  Standing TKE against green sport cord 2x10 Gait training for 5 min x 300 ft  PATIENT EDUCATION:  Education details: HEP  Person educated: Patient Education method: Programmer, multimedia, Demonstration, Actor cues, Verbal cues Education comprehension: verbalized understanding, returned demonstration, verbal cues required, tactile cues required, and needs further education  HOME EXERCISE PROGRAM: Access Code: [email or web address]    ASSESSMENT: CLINICAL  IMPRESSION: Patient tolerated therapy well with no adverse effects. Able to progress closed chain challenges and Single leg stability with good tolerance.   Patient will continue to benefit from skilled PT in order to continue increasing ROM, LE strength, improve gait, and increase endurance to be able to return fully to functional activities including work.   EVAL: Patient is a 63 y.o. female who was seen today for physical therapy evaluation and treatment for s/p left TKA on 01/19/2023. Patient responded well to therapy with no adverse effects. Patient has limited ROM in comparison to the right knee due to edema post op. Patient able to activate quad and complete a SLR, continue to progress quad strength in next sessions. Patient walks with a step through pattern at initial session indicating some initial level of quad strength. Overall, patients pain levels are tolerable during treatment session. FOTO score of 40% indicating a decrease in functional ability following surgery. Patient would benefit from skilled PT to decrease pain levels and increase ROM to return to her functional levels prior to surgery.   OBJECTIVE IMPAIRMENTS: Abnormal gait, decreased ROM, decreased strength, increased edema, and pain.   ACTIVITY LIMITATIONS: sitting, standing, squatting, stairs, and locomotion level  PARTICIPATION LIMITATIONS: community activity  PERSONAL FACTORS: Past/current experiences and Time since onset of injury/illness/exacerbation are also affecting patient's functional outcome.    GOALS: Goals reviewed with patient? Yes  SHORT TERM GOALS: Target date: 02/22/2023  Patient will be I with initial HEP in order to progress with therapy. Baseline: 02/26/2023: independent Goal status: MET  2.  Patient will score </= 8/10 at worse on the NPS by 3rd visit in order to show an improvement in pain allowing for an increase in functional ability.  Baseline: 10/10 02/15/2023: 8/10  Goal status: MET  LONG  TERM GOALS: Target date: 03/22/2023  Patient will be independent with final HEP in order to continue treatment at home for pain tolerance.  Baseline:  Goal status: INITIAL  2.  Patient will score at least 56% on FOTO to signify clinically meaningful improvement in functional abilities.  Baseline: 40% Goal status: INITIAL  3.  Patient will score </= 5/10 at worse on the NPSin order to show an improvement in pain allowing for an increase in functional ability.   Baseline: 10/10 Goal status: INITIAL  4.  Patient will increase knee flexion ROM by  at least 25 degrees to increase functional ability.  Baseline: 85 deg 02/15/2023 Goal status: INITIAL  5.  Patient will increase knee strength to a 4/5 to show an increase in quad strength to improve gait and functional ability. Baseline: 3/5 Goal status: INITIAL   PLAN: PT FREQUENCY: 2x/week  PT DURATION: 8 weeks  PLANNED INTERVENTIONS: 97164- PT Re-evaluation, 97110-Therapeutic exercises, 97530- Therapeutic activity, 97112- Neuromuscular re-education, 97535- Self Care, 96045- Manual therapy, [phone]- Gait training, [phone]- Vasopneumatic device, Patient/Family education, Stair training, Dry Needling, Joint mobilization, Joint manipulation, Spinal manipulation, Spinal mobilization, Cryotherapy, and Moist heat  PLAN FOR NEXT SESSION: review/revise HEP, continuing progressing strengthening and eccentric quad strength. Pain tolerance.   Jannette Spanner, PTA 03/09/23  10:56 AM Phone: [phone] Fax: [phone]

## 2023-03-12 ENCOUNTER — Other Ambulatory Visit: Payer: Self-pay | Admitting: Physician Assistant

## 2023-03-12 ENCOUNTER — Telehealth: Payer: Self-pay | Admitting: Orthopaedic Surgery

## 2023-03-12 MED ORDER — OXYCODONE HCL 5 MG PO TABS: 5.0000 mg | ORAL_TABLET | Freq: Three times a day (TID) | ORAL | 0 refills | Status: DC | PRN

## 2023-03-12 NOTE — Telephone Encounter (Signed)
 Rx refill Oxycodone   [facility]

## 2023-03-14 ENCOUNTER — Encounter: Payer: Self-pay | Admitting: Physical Therapy

## 2023-03-14 ENCOUNTER — Other Ambulatory Visit: Payer: Self-pay

## 2023-03-14 ENCOUNTER — Ambulatory Visit: Payer: Medicaid Other | Admitting: Physical Therapy

## 2023-03-14 DIAGNOSIS — M6281 Muscle weakness (generalized): Secondary | ICD-10-CM

## 2023-03-14 DIAGNOSIS — M25562 Pain in left knee: Secondary | ICD-10-CM

## 2023-03-14 DIAGNOSIS — R2689 Other abnormalities of gait and mobility: Secondary | ICD-10-CM

## 2023-03-14 NOTE — Therapy (Signed)
 OUTPATIENT PHYSICAL THERAPY TREATMENT  Patient Name: Tracy Mercado MRN: [id number] DOB:03-11-1960, 63 y.o., female Today's Date: 03/14/2023   END OF SESSION:  PT End of Session - 03/14/23 1024     Visit Number 13    Number of Visits 17    Date for PT Re-Evaluation 03/22/23    Authorization Type [employer]    PT Start Time 1017    PT Stop Time 1100    PT Time Calculation (min) 43 min    Activity Tolerance Patient tolerated treatment well    Behavior During Therapy WFL for tasks assessed/performed             Past Medical History:  Diagnosis Date   Arthritis    CAD (coronary artery disease)    Hypertension    Past Surgical History:  Procedure Laterality Date   HAND SURGERY Left    KNEE ARTHROSCOPY     TOTAL KNEE ARTHROPLASTY Right 04/05/2021   Procedure: Right TOTAL KNEE ARTHROPLASTY;  Surgeon: Kathryne Hitch, MD;  Location: [facility];  Service: Orthopedics;  Laterality: Right;   TOTAL KNEE ARTHROPLASTY Left 01/19/2023   Procedure: LEFT TOTAL KNEE ARTHROPLASTY;  Surgeon: Kathryne Hitch, MD;  Location: [facility];  Service: Orthopedics;  Laterality: Left;   Patient Active Problem List   Diagnosis Date Noted   Status post total left knee replacement 01/19/2023   Unilateral primary osteoarthritis, left knee 12/04/2022   Status post right knee replacement 04/05/2021   PCP: N/A  REFERRING PROVIDER: Kathryne Hitch, MD  REFERRING DIAG: [phone] (ICD-10-CM) - Status post left knee replacement   THERAPY DIAG:  Acute pain of left knee  Other abnormalities of gait and mobility  Muscle weakness (generalized)  Rationale for Evaluation and Treatment: Rehabilitation  ONSET DATE: 01/19/2023   SUBJECTIVE:  SUBJECTIVE STATEMENT: Patient report she is doing well now. Most of the pain occurs at night.   EVAL: Patient comes in with daughter 6 days post-op left TKA. Patient reports pain is most limiting factor to knee motion right now. She reports that she  is using the ice 2x3 times a day but not currently elevating the knee. She has no issues sleeping when taking the pain medication which she is taking on a consistent basis. Her last intake of the oxycodone was at 5 AM and she took 800 mlg of ibuprofen at 8 AM. Reports that her nerve block has worn off. She had a TKA on the R knee last year and she reports the L knee feels better initially after surgery. Her walking is going well and she is able to do stairs with both legs on each stair. She reports she was sent home with a brace but has not been wearing it. Overall, she feels she is in a good place for being a week out of surgery.  PERTINENT HISTORY: L TKA 01/19/2023 R TKA 04/05/2021  PAIN:  Are you having pain? Yes:  NPRS scale: 2/10 current, 8/10 at night Pain location: left knee Pain description: sharp, throbbing, achy Aggravating factors: everything  Relieving factors: ice and pain medication  PRECAUTIONS: Knee and Fall  RED FLAGS: None   WEIGHT BEARING RESTRICTIONS:  WBAT  FALLS:  Has patient fallen in last 6 months? No  LIVING ENVIRONMENT: Lives with: lives with their family Lives in: House/apartment Stairs: Yes: External: 14 steps; can reach both Has following equipment at home: Single point cane  OCCUPATION: N/A  PLOF: Independent  PATIENT GOALS: Return to ADLs without pain,  regain ROM.    OBJECTIVE:  Note: Objective measures were completed at Evaluation unless otherwise noted. PATIENT SURVEYS:  FOTO: 40% functional status, predicted 56%  EDEMA:  Notable edema in left knee   MUSCLE LENGTH: Assess hamstring tightness next session   POSTURE: No Significant postural limitations  PALPATION: Edema in left knee   LOWER EXTREMITY ROM:  Active ROM Right eval Left eval Left 1/28 Left 1/30 Left 2/3 Left 2/13 Left 2/24 Left 2/28  Hip flexion          Hip extension          Hip abduction          Hip adduction          Hip internal rotation          Hip  external rotation          Knee flexion 110 85 100 102 115 106 110 110  Knee extension -2 1  3  0 1    Ankle dorsiflexion          Ankle plantarflexion          Ankle inversion          Ankle eversion           (Blank rows = not tested)  LOWER EXTREMITY MMT:  MMT Right eval Left eval  Hip flexion    Hip extension    Hip abduction    Hip adduction    Hip internal rotation    Hip external rotation    Knee flexion    Knee extension    Ankle dorsiflexion    Ankle plantarflexion    Ankle inversion    Ankle eversion     (Blank rows = not tested)  FUNCTIONAL TESTS:  30 sec sit-to-stand:  02/12/2023: 13 reps at elevated surface.   GAIT: Distance walked: 40 Assistive device utilized: Single point cane Level of assistance: Complete Independence Comments: Antalgic gait                                                                                                         TREATMENT  [facility] Adult PT Treatment:                                                DATE: 03/14/2023 NuStep L5 x 5 min with LE to improve endurance and workload capacity Sit to stand x 15, holding 15# 3 x 10 LAQ with 5# 3 x 15 Standing hip abduction with green at knees 3 x 10 each Standing heel raises 2 x 20 Forward 8" step-up 2 x 10 each Tandem stance 3 x 30 sec Deadlift 30# 2 x 10  PATIENT EDUCATION:  Education details: HEP  Person educated: Patient Education method: Programmer, multimedia, Demonstration, Actor cues, Verbal cues Education comprehension: verbalized understanding, returned demonstration, verbal cues required, tactile cues required, and needs further education  HOME EXERCISE PROGRAM: Access Code: [facility]  ASSESSMENT: CLINICAL IMPRESSION: Patient tolerated therapy well with no adverse effects. Therapy focused primarily on progressing LE strengthening and workload tolerance. She was able to progress with her strengthening exercises and incorporated some lifting exercises. She did report feeling  muscular fatigue post session but denied any increase pain with therapy. She was educated that if her requirements at work are standing for 8 hours then she needs to begin progressing the time on her feet at home so she could build that tolerance for standing and ensure she is ready to complete work related tasks. Patient will continue to benefit from skilled PT in order to continue increasing ROM, LE strength, improve gait, and increase endurance to be able to return fully to functional activities including work.    EVAL: Patient is a 63 y.o. female who was seen today for physical therapy evaluation and treatment for s/p left TKA on 01/19/2023. Patient responded well to therapy with no adverse effects. Patient has limited ROM in comparison to the right knee due to edema post op. Patient able to activate quad and complete a SLR, continue to progress quad strength in next sessions. Patient walks with a step through pattern at initial session indicating some initial level of quad strength. Overall, patients pain levels are tolerable during treatment session. FOTO score of 40% indicating a decrease in functional ability following surgery. Patient would benefit from skilled PT to decrease pain levels and increase ROM to return to her functional levels prior to surgery.   OBJECTIVE IMPAIRMENTS: Abnormal gait, decreased ROM, decreased strength, increased edema, and pain.   ACTIVITY LIMITATIONS: sitting, standing, squatting, stairs, and locomotion level  PARTICIPATION LIMITATIONS: community activity  PERSONAL FACTORS: Past/current experiences and Time since onset of injury/illness/exacerbation are also affecting patient's functional outcome.    GOALS: Goals reviewed with patient? Yes  SHORT TERM GOALS: Target date: 02/22/2023  Patient will be I with initial HEP in order to progress with therapy. Baseline: 02/26/2023: independent Goal status: MET  2.  Patient will score </= 8/10 at worse on the NPS by  3rd visit in order to show an improvement in pain allowing for an increase in functional ability.  Baseline: 10/10 02/15/2023: 8/10  Goal status: MET  LONG TERM GOALS: Target date: 03/22/2023  Patient will be independent with final HEP in order to continue treatment at home for pain tolerance.  Baseline:  Goal status: INITIAL  2.  Patient will score at least 56% on FOTO to signify clinically meaningful improvement in functional abilities.  Baseline: 40% Goal status: INITIAL  3.  Patient will score </= 5/10 at worse on the NPSin order to show an improvement in pain allowing for an increase in functional ability.   Baseline: 10/10 Goal status: INITIAL  4.  Patient will increase knee flexion ROM by  at least 25 degrees to increase functional ability.  Baseline: 85 deg 02/15/2023 Goal status: INITIAL  5.  Patient will increase knee strength to a 4/5 to show an increase in quad strength to improve gait and functional ability. Baseline: 3/5 Goal status: INITIAL   PLAN: PT FREQUENCY: 2x/week  PT DURATION: 8 weeks  PLANNED INTERVENTIONS: 97164- PT Re-evaluation, 97110-Therapeutic exercises, 97530- Therapeutic activity, 97112- Neuromuscular re-education, 97535- Self Care, 40981- Manual therapy, [phone]- Gait training, [phone]- Vasopneumatic device, Patient/Family education, Stair training, Dry Needling, Joint mobilization, Joint manipulation, Spinal manipulation, Spinal mobilization, Cryotherapy, and Moist heat  PLAN FOR NEXT SESSION: review/revise HEP, continuing progressing strengthening and eccentric quad strength. Pain tolerance.   Rosana Hoes,  PT, DPT, LAT, ATC 03/14/23  11:04 AM Phone: [phone] Fax: [phone]

## 2023-03-14 NOTE — Patient Instructions (Signed)
 Access Code: [email or web address] URL: [email or web address] Date: 03/14/2023 Prepared by: Rosana Hoes  Exercises - Supine Heel Slide with Strap  - 1 x daily - 10 reps - 5 seconds hold - Supine Straight Leg Raises  - 1 x daily - 3 sets - 10 reps - Gastroc Stretch on Wall  - 1 x daily - 3 reps - 30 sec hold - Seated Hamstring Stretch  - 1 x daily - 3 reps - 30 sec hold - Sit to Stand Without Arm Support  - 1 x daily - 3 sets - 15 reps - Standing Hip Abduction with Resistance at Thighs  - 1 x daily - 3 sets - 10 reps - Heel Raises with Counter Support  - 1 x daily - 3 sets - 20 reps - Standing Tandem Balance with Counter Support  - 1 x daily - 3 reps - 30 hold

## 2023-03-16 ENCOUNTER — Ambulatory Visit: Payer: Medicaid Other | Admitting: Physical Therapy

## 2023-03-16 ENCOUNTER — Telehealth: Payer: Self-pay | Admitting: Orthopaedic Surgery

## 2023-03-16 ENCOUNTER — Other Ambulatory Visit: Payer: Self-pay | Admitting: Orthopaedic Surgery

## 2023-03-16 ENCOUNTER — Encounter: Payer: Self-pay | Admitting: Physical Therapy

## 2023-03-16 DIAGNOSIS — M25562 Pain in left knee: Secondary | ICD-10-CM | POA: Diagnosis not present

## 2023-03-16 DIAGNOSIS — R2689 Other abnormalities of gait and mobility: Secondary | ICD-10-CM

## 2023-03-16 MED ORDER — TIZANIDINE HCL 4 MG PO TABS: ORAL_TABLET | ORAL | 1 refills | Status: DC

## 2023-03-16 MED ORDER — HYDROCODONE-ACETAMINOPHEN 5-325 MG PO TABS
1.0000 | ORAL_TABLET | Freq: Three times a day (TID) | ORAL | 0 refills | Status: DC | PRN
Start: 2023-03-16 — End: 2023-03-22

## 2023-03-16 NOTE — Telephone Encounter (Signed)
 Rx refill Tizanidine and Oxycodone    [facility]

## 2023-03-16 NOTE — Telephone Encounter (Signed)
 Called patient; aware.

## 2023-03-16 NOTE — Therapy (Signed)
 OUTPATIENT PHYSICAL THERAPY TREATMENT  Patient Name: Tracy Mercado MRN: [id number] DOB:05/05/1960, 63 y.o., female Today's Date: 03/16/2023   END OF SESSION:  PT End of Session - 03/16/23 1021     Visit Number 14    Number of Visits 17    Date for PT Re-Evaluation 03/22/23    Authorization Type [facility]    PT Start Time 1015    PT Stop Time 1058    PT Time Calculation (min) 43 min             Past Medical History:  Diagnosis Date   Arthritis    CAD (coronary artery disease)    Hypertension    Past Surgical History:  Procedure Laterality Date   HAND SURGERY Left    KNEE ARTHROSCOPY     TOTAL KNEE ARTHROPLASTY Right 04/05/2021   Procedure: Right TOTAL KNEE ARTHROPLASTY;  Surgeon: Kathryne Hitch, MD;  Location: [facility];  Service: Orthopedics;  Laterality: Right;   TOTAL KNEE ARTHROPLASTY Left 01/19/2023   Procedure: LEFT TOTAL KNEE ARTHROPLASTY;  Surgeon: Kathryne Hitch, MD;  Location: [facility];  Service: Orthopedics;  Laterality: Left;   Patient Active Problem List   Diagnosis Date Noted   Status post total left knee replacement 01/19/2023   Unilateral primary osteoarthritis, left knee 12/04/2022   Status post right knee replacement 04/05/2021   PCP: N/A  REFERRING PROVIDER: Kathryne Hitch, MD  REFERRING DIAG: [phone] (ICD-10-CM) - Status post left knee replacement   THERAPY DIAG:  Acute pain of left knee  Other abnormalities of gait and mobility  Rationale for Evaluation and Treatment: Rehabilitation  ONSET DATE: 01/19/2023   SUBJECTIVE:  SUBJECTIVE STATEMENT:  Patient reports 3/10 knee pain and calf soreness from last session.    EVAL: Patient comes in with daughter 6 days post-op left TKA. Patient reports pain is most limiting factor to knee motion right now. She reports that she is using the ice 2x3 times a day but not currently elevating the knee. She has no issues sleeping when taking the pain medication which she is taking  on a consistent basis. Her last intake of the oxycodone was at 5 AM and she took 800 mlg of ibuprofen at 8 AM. Reports that her nerve block has worn off. She had a TKA on the R knee last year and she reports the L knee feels better initially after surgery. Her walking is going well and she is able to do stairs with both legs on each stair. She reports she was sent home with a brace but has not been wearing it. Overall, she feels she is in a good place for being a week out of surgery.  PERTINENT HISTORY: L TKA 01/19/2023 R TKA 04/05/2021  PAIN:  Are you having pain? Yes:  NPRS scale: 2/10 current, 8/10 at night Pain location: left knee Pain description: sharp, throbbing, achy Aggravating factors: everything  Relieving factors: ice and pain medication  PRECAUTIONS: Knee and Fall  RED FLAGS: None   WEIGHT BEARING RESTRICTIONS:  WBAT  FALLS:  Has patient fallen in last 6 months? No  LIVING ENVIRONMENT: Lives with: lives with their family Lives in: House/apartment Stairs: Yes: External: 14 steps; can reach both Has following equipment at home: Single point cane  OCCUPATION: N/A  PLOF: Independent  PATIENT GOALS: Return to ADLs without pain, regain ROM.    OBJECTIVE:  Note: Objective measures were completed at Evaluation unless otherwise noted. PATIENT SURVEYS:  FOTO: 40% functional status,  predicted 56%  EDEMA:  Notable edema in left knee   MUSCLE LENGTH: Assess hamstring tightness next session   POSTURE: No Significant postural limitations  PALPATION: Edema in left knee   LOWER EXTREMITY ROM:  Active ROM Right eval Left eval Left 1/28 Left 1/30 Left 2/3 Left 2/13 Left 2/24 Left 2/28 Left 03/16/23  Hip flexion           Hip extension           Hip abduction           Hip adduction           Hip internal rotation           Hip external rotation           Knee flexion 110 85 100 102 115 106 110 110 112 A beginning 120 A end  Knee extension -2 1  3  0 1     Ankle  dorsiflexion           Ankle plantarflexion           Ankle inversion           Ankle eversion            (Blank rows = not tested)  LOWER EXTREMITY MMT:  MMT Right eval Left eval  Hip flexion    Hip extension    Hip abduction    Hip adduction    Hip internal rotation    Hip external rotation    Knee flexion    Knee extension    Ankle dorsiflexion    Ankle plantarflexion    Ankle inversion    Ankle eversion     (Blank rows = not tested)  FUNCTIONAL TESTS:  30 sec sit-to-stand:  02/12/2023: 13 reps at elevated surface.   GAIT: Distance walked: 40 Assistive device utilized: Single point cane Level of assistance: Complete Independence Comments: Antalgic gait                                                                                                         TREATMENT  [facility] Adult PT Treatment:                                                DATE: 03/16/23  Nustep  x 5 minutes LE only  Slant board  Knee ext 5# x 10 bilat, x 10 single leg Knee flex 20# bilat, x 10 single  Leg press 35# 10 x 2 , single leg STS 15# 10 x 3 ( 1 set with LLE back)  Prone quad stretch 30 sec x 3 FOTO status   [facility] Adult PT Treatment:  DATE: 03/14/2023 NuStep L5 x 5 min with LE to improve endurance and workload capacity Sit to stand x 15, holding 15# 3 x 10 LAQ with 5# 3 x 15 Standing hip abduction with green at knees 3 x 10 each Standing heel raises 2 x 20 Forward 8" step-up 2 x 10 each Tandem stance 3 x 30 sec Deadlift 30# 2 x 10  PATIENT EDUCATION:  Education details: HEP  Person educated: Patient Education method: Programmer, multimedia, Demonstration, Actor cues, Verbal cues Education comprehension: verbalized understanding, returned demonstration, verbal cues required, tactile cues required, and needs further education  HOME EXERCISE PROGRAM: Access Code: [email or web address]    ASSESSMENT: CLINICAL IMPRESSION: Patient tolerated therapy well  with no adverse effects. Therapy focused primarily on progressing LE strengthening using gym machines. AROM 112 prior to stretching. AROM 120 after prone quad stretch. FOTO score improved beyond target. LTG# 2 met. She is nearing the end of POC and will plan to assess LTGs next week. Patient will continue to benefit from skilled PT in order to continue increasing ROM, LE strength, improve gait, and increase endurance to be able to return fully to functional activities including work.    EVAL: Patient is a 63 y.o. female who was seen today for physical therapy evaluation and treatment for s/p left TKA on 01/19/2023. Patient responded well to therapy with no adverse effects. Patient has limited ROM in comparison to the right knee due to edema post op. Patient able to activate quad and complete a SLR, continue to progress quad strength in next sessions. Patient walks with a step through pattern at initial session indicating some initial level of quad strength. Overall, patients pain levels are tolerable during treatment session. FOTO score of 40% indicating a decrease in functional ability following surgery. Patient would benefit from skilled PT to decrease pain levels and increase ROM to return to her functional levels prior to surgery.   OBJECTIVE IMPAIRMENTS: Abnormal gait, decreased ROM, decreased strength, increased edema, and pain.   ACTIVITY LIMITATIONS: sitting, standing, squatting, stairs, and locomotion level  PARTICIPATION LIMITATIONS: community activity  PERSONAL FACTORS: Past/current experiences and Time since onset of injury/illness/exacerbation are also affecting patient's functional outcome.    GOALS: Goals reviewed with patient? Yes  SHORT TERM GOALS: Target date: 02/22/2023  Patient will be I with initial HEP in order to progress with therapy. Baseline: 02/26/2023: independent Goal status: MET  2.  Patient will score </= 8/10 at worse on the NPS by 3rd visit in order to show an  improvement in pain allowing for an increase in functional ability.  Baseline: 10/10 02/15/2023: 8/10  Goal status: MET  LONG TERM GOALS: Target date: 03/22/2023  Patient will be independent with final HEP in order to continue treatment at home for pain tolerance.  Baseline:  Goal status: INITIAL  2.  Patient will score at least 56% on FOTO to signify clinically meaningful improvement in functional abilities.  Baseline: 40% Goal status: MET  3.  Patient will score </= 5/10 at worse on the NPSin order to show an improvement in pain allowing for an increase in functional ability.   Baseline: 10/10 Goal status: INITIAL  4.  Patient will increase knee flexion ROM by  at least 25 degrees to increase functional ability.  Baseline: 85 deg 03/16/23: 112 Goal status: ONGOING  5.  Patient will increase knee strength to a 4/5 to show an increase in quad strength to improve gait and functional ability. Baseline: 3/5 Goal status: INITIAL   PLAN:  PT FREQUENCY: 2x/week  PT DURATION: 8 weeks  PLANNED INTERVENTIONS: 97164- PT Re-evaluation, 97110-Therapeutic exercises, 97530- Therapeutic activity, 97112- Neuromuscular re-education, 97535- Self Care, 78295- Manual therapy, [phone]- Gait training, [phone]- Vasopneumatic device, Patient/Family education, Stair training, Dry Needling, Joint mobilization, Joint manipulation, Spinal manipulation, Spinal mobilization, Cryotherapy, and Moist heat  PLAN FOR NEXT SESSION: review/revise HEP, continuing progressing strengthening and eccentric quad strength. Pain tolerance.   Jannette Spanner, PTA 03/16/23 10:59 AM Phone: [phone] Fax: [phone]

## 2023-03-21 ENCOUNTER — Ambulatory Visit: Admitting: Physical Therapy

## 2023-03-22 ENCOUNTER — Other Ambulatory Visit: Payer: Self-pay | Admitting: Orthopaedic Surgery

## 2023-03-22 ENCOUNTER — Telehealth: Payer: Self-pay | Admitting: Orthopaedic Surgery

## 2023-03-22 MED ORDER — HYDROCODONE-ACETAMINOPHEN 5-325 MG PO TABS: 1.0000 | ORAL_TABLET | Freq: Three times a day (TID) | ORAL | 0 refills | Status: DC | PRN

## 2023-03-22 NOTE — Telephone Encounter (Signed)
 Rx refill Hydrocodone      [facility]

## 2023-03-22 NOTE — Therapy (Signed)
 OUTPATIENT PHYSICAL THERAPY TREATMENT  Patient Name: Tracy Mercado MRN: [id number] DOB:02-17-1960, 63 y.o., female Today's Date: 03/22/2023   END OF SESSION:    Past Medical History:  Diagnosis Date   Arthritis    CAD (coronary artery disease)    Hypertension    Past Surgical History:  Procedure Laterality Date   HAND SURGERY Left    KNEE ARTHROSCOPY     TOTAL KNEE ARTHROPLASTY Right 04/05/2021   Procedure: Right TOTAL KNEE ARTHROPLASTY;  Surgeon: Kathryne Hitch, MD;  Location: [facility];  Service: Orthopedics;  Laterality: Right;   TOTAL KNEE ARTHROPLASTY Left 01/19/2023   Procedure: LEFT TOTAL KNEE ARTHROPLASTY;  Surgeon: Kathryne Hitch, MD;  Location: [facility];  Service: Orthopedics;  Laterality: Left;   Patient Active Problem List   Diagnosis Date Noted   Status post total left knee replacement 01/19/2023   Unilateral primary osteoarthritis, left knee 12/04/2022   Status post right knee replacement 04/05/2021   PCP: N/A  REFERRING PROVIDER: Kathryne Hitch, MD  REFERRING DIAG: [phone] (ICD-10-CM) - Status post left knee replacement   THERAPY DIAG:  No diagnosis found.  Rationale for Evaluation and Treatment: Rehabilitation  ONSET DATE: 01/19/2023   SUBJECTIVE:  SUBJECTIVE STATEMENT:  Patient reports 3/10 knee pain and calf soreness from last session.    EVAL: Patient comes in with daughter 6 days post-op left TKA. Patient reports pain is most limiting factor to knee motion right now. She reports that she is using the ice 2x3 times a day but not currently elevating the knee. She has no issues sleeping when taking the pain medication which she is taking on a consistent basis. Her last intake of the oxycodone was at 5 AM and she took 800 mlg of ibuprofen at 8 AM. Reports that her nerve block has worn off. She had a TKA on the R knee last year and she reports the L knee feels better initially after surgery. Her walking is going well and she is  able to do stairs with both legs on each stair. She reports she was sent home with a brace but has not been wearing it. Overall, she feels she is in a good place for being a week out of surgery.  PERTINENT HISTORY: L TKA 01/19/2023 R TKA 04/05/2021  PAIN:  Are you having pain? Yes:  NPRS scale: 2/10 current, 8/10 at night Pain location: left knee Pain description: sharp, throbbing, achy Aggravating factors: everything  Relieving factors: ice and pain medication  PRECAUTIONS: Knee and Fall  RED FLAGS: None   WEIGHT BEARING RESTRICTIONS:  WBAT  FALLS:  Has patient fallen in last 6 months? No  LIVING ENVIRONMENT: Lives with: lives with their family Lives in: House/apartment Stairs: Yes: External: 14 steps; can reach both Has following equipment at home: Single point cane  OCCUPATION: N/A  PLOF: Independent  PATIENT GOALS: Return to ADLs without pain, regain ROM.    OBJECTIVE:  Note: Objective measures were completed at Evaluation unless otherwise noted. PATIENT SURVEYS:  FOTO: 40% functional status, predicted 56%  03/23/2023: ***  EDEMA:  Notable edema in left knee   MUSCLE LENGTH: Assess hamstring tightness next session   POSTURE: No Significant postural limitations  PALPATION: Edema in left knee   LOWER EXTREMITY ROM:  Active ROM Right eval Left eval Left 1/28 Left 1/30 Left 2/3 Left 2/13 Left 2/24 Left 2/28 Left 03/16/23 Left 03/23/2023  Hip flexion            Hip  extension            Hip abduction            Hip adduction            Hip internal rotation            Hip external rotation            Knee flexion 110 85 100 102 115 106 110 110 112 A beginning 120 A end ***  Knee extension -2 1  3  0 1    ***  Ankle dorsiflexion            Ankle plantarflexion            Ankle inversion            Ankle eversion             (Blank rows = not tested)  LOWER EXTREMITY MMT:  MMT Right eval Left eval Left 03/23/2023  Hip flexion     Hip extension      Hip abduction     Hip adduction     Hip internal rotation     Hip external rotation     Knee flexion   ***  Knee extension   **  Ankle dorsiflexion     Ankle plantarflexion     Ankle inversion     Ankle eversion      (Blank rows = not tested)  FUNCTIONAL TESTS:  30 sec sit-to-stand:  02/12/2023: 13 reps at elevated surface.  03/22/2023: ***  GAIT: Distance walked: 40 Assistive device utilized: Single point cane Level of assistance: Complete Independence Comments: Antalgic gait                                                                                                         TREATMENT  [facility] Adult PT Treatment:                                                DATE: 03/23/2023 ***   [facility] Adult PT Treatment:                                                DATE: 03/16/23  Nustep  x 5 minutes LE only  Slant board  Knee ext 5# x 10 bilat, x 10 single leg Knee flex 20# bilat, x 10 single  Leg press 35# 10 x 2 , single leg STS 15# 10 x 3 ( 1 set with LLE back)  Prone quad stretch 30 sec x 3 FOTO status   [facility] Adult PT Treatment:  DATE: 03/14/2023 NuStep L5 x 5 min with LE to improve endurance and workload capacity Sit to stand x 15, holding 15# 3 x 10 LAQ with 5# 3 x 15 Standing hip abduction with green at knees 3 x 10 each Standing heel raises 2 x 20 Forward 8" step-up 2 x 10 each Tandem stance 3 x 30 sec Deadlift 30# 2 x 10  PATIENT EDUCATION:  Education details: HEP  Person educated: Patient Education method: Programmer, multimedia, Demonstration, Actor cues, Verbal cues Education comprehension: verbalized understanding, returned demonstration, verbal cues required, tactile cues required, and needs further education  HOME EXERCISE PROGRAM: Access Code: [email or web address]    ASSESSMENT: CLINICAL IMPRESSION: Patient tolerated therapy well with no adverse effects. ***  Patient will continue to benefit from skilled PT in order to  progress her mobility, strength, and endurance to be able to return fully to functional activities including work.   Therapy focused primarily on progressing LE strengthening using gym machines. AROM 112 prior to stretching. AROM 120 after prone quad stretch. FOTO score improved beyond target. LTG# 2 met. She is nearing the end of POC and will plan to assess LTGs next week. Patient will continue to benefit from skilled PT in order to continue increasing ROM, LE strength, improve gait, and increase endurance to be able to return fully to functional activities including work.    EVAL: Patient is a 63 y.o. female who was seen today for physical therapy evaluation and treatment for s/p left TKA on 01/19/2023. Patient responded well to therapy with no adverse effects. Patient has limited ROM in comparison to the right knee due to edema post op. Patient able to activate quad and complete a SLR, continue to progress quad strength in next sessions. Patient walks with a step through pattern at initial session indicating some initial level of quad strength. Overall, patients pain levels are tolerable during treatment session. FOTO score of 40% indicating a decrease in functional ability following surgery. Patient would benefit from skilled PT to decrease pain levels and increase ROM to return to her functional levels prior to surgery.   OBJECTIVE IMPAIRMENTS: Abnormal gait, decreased ROM, decreased strength, increased edema, and pain.   ACTIVITY LIMITATIONS: sitting, standing, squatting, stairs, and locomotion level  PARTICIPATION LIMITATIONS: community activity  PERSONAL FACTORS: Past/current experiences and Time since onset of injury/illness/exacerbation are also affecting patient's functional outcome.    GOALS: Goals reviewed with patient? Yes  SHORT TERM GOALS: Target date: 02/22/2023  Patient will be I with initial HEP in order to progress with therapy. Baseline: 02/26/2023: independent Goal status:  MET  2.  Patient will score </= 8/10 at worse on the NPS by 3rd visit in order to show an improvement in pain allowing for an increase in functional ability.  Baseline: 10/10 02/15/2023: 8/10  Goal status: MET  LONG TERM GOALS: Target date: 03/22/2023  Patient will be independent with final HEP in order to continue treatment at home for pain tolerance.  Baseline:  03/23/2023: progressing Goal status: ONGOING  2.  Patient will score at least 56% on FOTO to signify clinically meaningful improvement in functional abilities.  Baseline: 40% 03/23/2023: Goal status: MET  3.  Patient will score </= 5/10 at worse on the NPSin order to show an improvement in pain allowing for an increase in functional ability.  Baseline: 10/10 03/23/2023:  Goal status: INITIAL  4.  Patient will increase knee flexion ROM by  at least 25 degrees to increase functional ability.  Baseline: 85 deg 03/16/23:  112 03/23/2023:  Goal status: ONGOING  5.  Patient will increase knee strength to a 4/5 to show an increase in quad strength to improve gait and functional ability. Baseline: 3/5 03/23/2023: Goal status: INITIAL   PLAN: PT FREQUENCY: 2x/week  PT DURATION: 8 weeks  PLANNED INTERVENTIONS: 97164- PT Re-evaluation, 97110-Therapeutic exercises, 97530- Therapeutic activity, 97112- Neuromuscular re-education, 97535- Self Care, 62952- Manual therapy, [phone]- Gait training, [phone]- Vasopneumatic device, Patient/Family education, Stair training, Dry Needling, Joint mobilization, Joint manipulation, Spinal manipulation, Spinal mobilization, Cryotherapy, and Moist heat  PLAN FOR NEXT SESSION: review/revise HEP, continuing progressing strengthening and eccentric quad strength. Pain tolerance.    Rosana Hoes, PT, DPT, LAT, ATC 03/22/23  1:35 PM Phone: [phone] Fax: [phone]

## 2023-03-23 ENCOUNTER — Encounter: Payer: Self-pay | Admitting: Physical Therapy

## 2023-03-23 ENCOUNTER — Other Ambulatory Visit: Payer: Self-pay

## 2023-03-23 ENCOUNTER — Ambulatory Visit: Admitting: Physical Therapy

## 2023-03-23 DIAGNOSIS — M25562 Pain in left knee: Secondary | ICD-10-CM

## 2023-03-23 DIAGNOSIS — M6281 Muscle weakness (generalized): Secondary | ICD-10-CM

## 2023-03-23 DIAGNOSIS — R2689 Other abnormalities of gait and mobility: Secondary | ICD-10-CM

## 2023-03-23 NOTE — Patient Instructions (Signed)
 Access Code: [email or web address] URL: [email or web address] Date: 03/23/2023 Prepared by: Rosana Hoes  Exercises - Supine Heel Slide with Strap  - 1 x daily - 10 reps - 5 seconds hold - Supine Straight Leg Raises  - 1 x daily - 3 sets - 15 reps - Gastroc Stretch on Wall  - 1 x daily - 3 reps - 30 sec hold - Seated Hamstring Stretch  - 1 x daily - 3 reps - 30 sec hold - Sit to Stand Without Arm Support  - 1 x daily - 3 sets - 15 reps - Standing Hip Abduction with Resistance at Thighs  - 1 x daily - 3 sets - 10 reps - Heel Raises with Counter Support  - 1 x daily - 3 sets - 20 reps - Standing Tandem Balance with Counter Support  - 1 x daily - 3 reps - 30 hold

## 2023-03-28 ENCOUNTER — Other Ambulatory Visit: Payer: Self-pay | Admitting: Orthopaedic Surgery

## 2023-03-28 ENCOUNTER — Telehealth: Payer: Self-pay | Admitting: Orthopaedic Surgery

## 2023-03-28 ENCOUNTER — Encounter: Payer: Self-pay | Admitting: Orthopaedic Surgery

## 2023-03-28 ENCOUNTER — Ambulatory Visit (INDEPENDENT_AMBULATORY_CARE_PROVIDER_SITE_OTHER): Payer: Medicaid Other | Admitting: Orthopaedic Surgery

## 2023-03-28 DIAGNOSIS — Z96652 Presence of left artificial knee joint: Secondary | ICD-10-CM

## 2023-03-28 MED ORDER — HYDROCODONE-ACETAMINOPHEN 5-325 MG PO TABS: 1.0000 | ORAL_TABLET | Freq: Three times a day (TID) | ORAL | 0 refills | Status: DC | PRN

## 2023-03-28 NOTE — Progress Notes (Signed)
 The patient is now 10 weeks status post a left total knee arthroplasty.  She has remote history of a right knee replaced.  She is 63 years old.  She is ambulate with a cane.  She says the pain is worse at night.  Examination of her left knee shows only mild swelling.  Her extension is full and her flexion is almost full.  The knee feels stable.  There is some slight clicking.  She will continue to work on quad strengthening exercises.  She does want a note to her let her return to work so we will let her return to work at the 33-month standpoint which will be April 09, 2023.  This will be without restrictions.  The next time we need to see her is not for 3 months.  At that visit we will have a standing AP and lateral of her left operative knee.

## 2023-03-28 NOTE — Telephone Encounter (Signed)
 Patient called and said the pharmacy needs authorization for her to pick up the medicine. CB#[phone]

## 2023-03-28 NOTE — Telephone Encounter (Signed)
 Patient called. Would like a refill on hydrocodone.

## 2023-03-29 ENCOUNTER — Telehealth: Payer: Self-pay | Admitting: Orthopaedic Surgery

## 2023-03-29 NOTE — Telephone Encounter (Signed)
 Patient aware PA has been done, just waiting for approval

## 2023-03-29 NOTE — Telephone Encounter (Signed)
 Patient's wife called. The pharmacy is waiting on auth for her pain medication. Would like Dr. Magnus Ivan to call the pharmacy for auth.

## 2023-03-29 NOTE — Telephone Encounter (Signed)
 Yes. I'll bring down. TY

## 2023-04-06 ENCOUNTER — Other Ambulatory Visit: Payer: Self-pay | Admitting: Orthopaedic Surgery

## 2023-04-06 ENCOUNTER — Telehealth: Payer: Self-pay | Admitting: Orthopaedic Surgery

## 2023-04-06 MED ORDER — HYDROCODONE-ACETAMINOPHEN 5-325 MG PO TABS: 1.0000 | ORAL_TABLET | Freq: Three times a day (TID) | ORAL | 0 refills | Status: DC | PRN

## 2023-04-06 NOTE — Telephone Encounter (Signed)
 Patient called and ask for a refill on Hydrocodone. Need it to go to [facility]. CB#[phone]

## 2023-04-10 ENCOUNTER — Telehealth: Payer: Self-pay | Admitting: Orthopaedic Surgery

## 2023-04-10 NOTE — Telephone Encounter (Signed)
 Received

## 2023-04-10 NOTE — Telephone Encounter (Signed)
 Pt submitted medical release form, disability forms fro [employer], and payment $ 20.00 debit/credit card. Accepted 04/10/23

## 2023-04-16 ENCOUNTER — Telehealth: Payer: Self-pay | Admitting: Orthopaedic Surgery

## 2023-04-16 ENCOUNTER — Other Ambulatory Visit: Payer: Self-pay | Admitting: Orthopaedic Surgery

## 2023-04-16 MED ORDER — HYDROCODONE-ACETAMINOPHEN 5-325 MG PO TABS: 1.0000 | ORAL_TABLET | Freq: Three times a day (TID) | ORAL | 0 refills | Status: DC | PRN

## 2023-04-16 NOTE — Telephone Encounter (Signed)
 Pt called requesting refill of hydrocodone. Please send to [facility]. Pt phone number is [phone].

## 2023-04-23 ENCOUNTER — Other Ambulatory Visit: Payer: Self-pay | Admitting: Orthopaedic Surgery

## 2023-04-23 ENCOUNTER — Telehealth: Payer: Self-pay | Admitting: Orthopaedic Surgery

## 2023-04-23 MED ORDER — TIZANIDINE HCL 4 MG PO TABS: ORAL_TABLET | ORAL | 1 refills | Status: DC

## 2023-04-23 MED ORDER — HYDROCODONE-ACETAMINOPHEN 5-325 MG PO TABS: 1.0000 | ORAL_TABLET | Freq: Three times a day (TID) | ORAL | 0 refills | Status: DC | PRN

## 2023-04-23 MED ORDER — CELECOXIB 200 MG PO CAPS: 200.0000 mg | ORAL_CAPSULE | Freq: Two times a day (BID) | ORAL | 1 refills | Status: DC | PRN

## 2023-04-23 NOTE — Telephone Encounter (Signed)
 Pt requesting refill on medication. Tizanidine  and Hydrocodone  to [facility] on [address], [place], [place]. Pt also shared that the medication she's taking is not taking away the pain and requesting stronger medication.

## 2023-04-23 NOTE — Telephone Encounter (Signed)
 Patient aware of the below message

## 2023-05-07 ENCOUNTER — Telehealth: Payer: Self-pay | Admitting: Orthopaedic Surgery

## 2023-05-07 NOTE — Telephone Encounter (Signed)
 Pt called requesting a refill of hydrocodone . Please send to pharmacy on file. Pt phone number  is [phone].

## 2023-05-08 ENCOUNTER — Other Ambulatory Visit: Payer: Self-pay | Admitting: Orthopaedic Surgery

## 2023-05-08 MED ORDER — HYDROCODONE-ACETAMINOPHEN 5-325 MG PO TABS: 1.0000 | ORAL_TABLET | Freq: Three times a day (TID) | ORAL | 0 refills | Status: AC | PRN

## 2023-05-08 NOTE — Telephone Encounter (Signed)
 Patient aware of the below message

## 2023-05-23 ENCOUNTER — Other Ambulatory Visit: Payer: Self-pay | Admitting: Orthopaedic Surgery

## 2023-06-08 ENCOUNTER — Other Ambulatory Visit: Payer: Self-pay | Admitting: Surgical

## 2023-06-08 ENCOUNTER — Telehealth: Payer: Self-pay | Admitting: Orthopaedic Surgery

## 2023-06-08 MED ORDER — GABAPENTIN 300 MG PO CAPS: ORAL_CAPSULE | ORAL | 3 refills | Status: DC

## 2023-06-08 NOTE — Telephone Encounter (Signed)
 Rx refill Gabapentin    [facility]

## 2023-06-08 NOTE — Telephone Encounter (Signed)
 refilled

## 2023-06-20 ENCOUNTER — Telehealth: Payer: Self-pay | Admitting: Orthopaedic Surgery

## 2023-06-20 ENCOUNTER — Other Ambulatory Visit: Payer: Self-pay

## 2023-06-20 MED ORDER — CELECOXIB 200 MG PO CAPS: 200.0000 mg | ORAL_CAPSULE | Freq: Two times a day (BID) | ORAL | 1 refills | Status: DC | PRN

## 2023-06-20 NOTE — Telephone Encounter (Signed)
 Patient called and needs a refill on her Celebrix. CB#[phone]

## 2023-07-02 ENCOUNTER — Ambulatory Visit (INDEPENDENT_AMBULATORY_CARE_PROVIDER_SITE_OTHER): Admitting: Orthopaedic Surgery

## 2023-07-02 ENCOUNTER — Encounter: Payer: Self-pay | Admitting: Orthopaedic Surgery

## 2023-07-02 ENCOUNTER — Other Ambulatory Visit (INDEPENDENT_AMBULATORY_CARE_PROVIDER_SITE_OTHER): Payer: Self-pay

## 2023-07-02 DIAGNOSIS — Z96652 Presence of left artificial knee joint: Secondary | ICD-10-CM

## 2023-07-02 NOTE — Progress Notes (Signed)
 The patient is now 5 months status post a left total knee replacement.  We have also replaced her right knee.  She is left knee is doing well and it is getting better but she still has some lateral pain.  She has been taking combination of Celebrex  gabapentin  and Zanaflex  to help her at night.  She has no issues with her right knee again that was replaced over a year ago.  Examination of her right knee shows no swelling with full range of motion and is ligamentously stable.  Her left knee still shows some swelling to be expected and a little bit of lateral pain but the range of motion is full and there is no instability on exam.  2 views of left knee show well-seated cemented left total knee arthroplasty with no complicating features.  The AP view also shows a right knee standing and appears in good alignment.  She will continue to work on quad strengthening exercises.  The next time we see is not for 6 months unless there are issues.  Well final AP and lateral of both knees at that visit.

## 2023-07-11 ENCOUNTER — Other Ambulatory Visit: Payer: Self-pay | Admitting: Orthopaedic Surgery

## 2023-07-24 ENCOUNTER — Telehealth: Payer: Self-pay | Admitting: Orthopaedic Surgery

## 2023-07-24 ENCOUNTER — Other Ambulatory Visit: Payer: Self-pay | Admitting: Orthopaedic Surgery

## 2023-07-24 MED ORDER — CELECOXIB 200 MG PO CAPS: 200.0000 mg | ORAL_CAPSULE | Freq: Two times a day (BID) | ORAL | 3 refills | Status: DC | PRN

## 2023-07-24 MED ORDER — GABAPENTIN 300 MG PO CAPS: ORAL_CAPSULE | ORAL | 3 refills | Status: DC

## 2023-07-24 NOTE — Telephone Encounter (Signed)
 Pt called for refill of celebrex  and gabapentin . Please send to pharmacy [facility]. Pt phone number is [phone].

## 2023-10-18 ENCOUNTER — Other Ambulatory Visit: Payer: Self-pay | Admitting: Orthopaedic Surgery

## 2023-11-05 ENCOUNTER — Encounter: Payer: Self-pay | Admitting: Radiology

## 2023-12-31 ENCOUNTER — Other Ambulatory Visit (INDEPENDENT_AMBULATORY_CARE_PROVIDER_SITE_OTHER): Payer: Self-pay

## 2023-12-31 ENCOUNTER — Other Ambulatory Visit: Payer: Self-pay

## 2023-12-31 ENCOUNTER — Ambulatory Visit (INDEPENDENT_AMBULATORY_CARE_PROVIDER_SITE_OTHER): Admitting: Orthopaedic Surgery

## 2023-12-31 DIAGNOSIS — Z96653 Presence of artificial knee joint, bilateral: Secondary | ICD-10-CM

## 2023-12-31 NOTE — Progress Notes (Signed)
 The patient is a 63 year old female who has a history of both her knees being replaced.  Her right knee was replaced in April 2023 and her left knee in January of this year 2025.  She reports that her knees are doing well.  She reports good range of motion and strength.  She says they just ache for a little bit if she has been standing all day long.  On exam both knees have full flexion and full extension.  Both knees are ligamentously stable.  No significant effusion with either knee.  An AP and lateral of both knees today shows well-seated cemented total knee arthroplasties with good alignment and no complicating features.  At this point follow-up for her knees can be as needed.  If she does develop any issues with him at all she knows do not hesitate to reach out and let us  know.

## 2024-01-07 ENCOUNTER — Other Ambulatory Visit: Payer: Self-pay | Admitting: Orthopaedic Surgery

## 2024-02-05 ENCOUNTER — Other Ambulatory Visit: Payer: Self-pay | Admitting: Orthopaedic Surgery
